# Patient Record
Sex: Female | Born: 1935 | Race: White | Hispanic: No | Smoking: Never smoker
Health system: Southern US, Community
[De-identification: ages and names within clinical notes are randomized; demographics above are authoritative.]

## PROBLEM LIST (undated history)

## (undated) DIAGNOSIS — R296 Repeated falls: Secondary | ICD-10-CM

## (undated) DIAGNOSIS — W19XXXA Unspecified fall, initial encounter: Secondary | ICD-10-CM

## (undated) DIAGNOSIS — R112 Nausea with vomiting, unspecified: Secondary | ICD-10-CM

## (undated) DIAGNOSIS — C189 Malignant neoplasm of colon, unspecified: Secondary | ICD-10-CM

## (undated) DIAGNOSIS — F0280 Dementia in other diseases classified elsewhere without behavioral disturbance: Secondary | ICD-10-CM

## (undated) DIAGNOSIS — E079 Disorder of thyroid, unspecified: Secondary | ICD-10-CM

## (undated) DIAGNOSIS — F419 Anxiety disorder, unspecified: Secondary | ICD-10-CM

## (undated) DIAGNOSIS — Z9889 Other specified postprocedural states: Secondary | ICD-10-CM

## (undated) DIAGNOSIS — G309 Alzheimer's disease, unspecified: Principal | ICD-10-CM

## (undated) DIAGNOSIS — I951 Orthostatic hypotension: Secondary | ICD-10-CM

## (undated) DIAGNOSIS — N390 Urinary tract infection, site not specified: Secondary | ICD-10-CM

## (undated) HISTORY — DX: Disorder of thyroid, unspecified: E07.9

## (undated) HISTORY — PX: ABDOMINAL HYSTERECTOMY: SHX81

## (undated) HISTORY — PX: US ECHOCARDIOGRAPHY: HXRAD669

## (undated) HISTORY — DX: Orthostatic hypotension: I95.1

## (undated) HISTORY — DX: Anxiety disorder, unspecified: F41.9

## (undated) HISTORY — DX: Alzheimer's disease, unspecified: G30.9

## (undated) HISTORY — DX: Malignant neoplasm of colon, unspecified: C18.9

## (undated) HISTORY — DX: Dementia in other diseases classified elsewhere, unspecified severity, without behavioral disturbance, psychotic disturbance, mood disturbance, and anxiety: F02.80

---

## 1998-04-22 ENCOUNTER — Other Ambulatory Visit: Admission: RE | Admit: 1998-04-22 | Discharge: 1998-04-22 | Payer: Self-pay | Admitting: Obstetrics & Gynecology

## 1999-05-20 ENCOUNTER — Other Ambulatory Visit: Admission: RE | Admit: 1999-05-20 | Discharge: 1999-05-20 | Payer: Self-pay | Admitting: Obstetrics and Gynecology

## 1999-12-10 ENCOUNTER — Ambulatory Visit (HOSPITAL_COMMUNITY): Admission: RE | Admit: 1999-12-10 | Discharge: 1999-12-10 | Payer: Self-pay | Admitting: Family Medicine

## 1999-12-10 ENCOUNTER — Encounter: Payer: Self-pay | Admitting: Family Medicine

## 2001-01-05 ENCOUNTER — Other Ambulatory Visit: Admission: RE | Admit: 2001-01-05 | Discharge: 2001-01-05 | Payer: Self-pay | Admitting: Obstetrics and Gynecology

## 2002-03-21 ENCOUNTER — Other Ambulatory Visit: Admission: RE | Admit: 2002-03-21 | Discharge: 2002-03-21 | Payer: Self-pay | Admitting: Obstetrics and Gynecology

## 2003-08-12 ENCOUNTER — Emergency Department (HOSPITAL_COMMUNITY): Admission: EM | Admit: 2003-08-12 | Discharge: 2003-08-12 | Payer: Self-pay | Admitting: Emergency Medicine

## 2003-08-20 ENCOUNTER — Ambulatory Visit (HOSPITAL_COMMUNITY): Admission: RE | Admit: 2003-08-20 | Discharge: 2003-08-20 | Payer: Self-pay | Admitting: Family Medicine

## 2003-12-14 ENCOUNTER — Encounter: Admission: RE | Admit: 2003-12-14 | Discharge: 2003-12-14 | Payer: Self-pay | Admitting: Orthopedic Surgery

## 2007-07-11 ENCOUNTER — Encounter: Payer: Self-pay | Admitting: Family Medicine

## 2007-07-20 ENCOUNTER — Encounter: Payer: Self-pay | Admitting: Family Medicine

## 2008-07-17 ENCOUNTER — Encounter: Payer: Self-pay | Admitting: Family Medicine

## 2008-08-30 HISTORY — PX: PARTIAL COLECTOMY: SHX5273

## 2009-02-27 ENCOUNTER — Encounter: Payer: Self-pay | Admitting: Family Medicine

## 2009-03-08 ENCOUNTER — Encounter: Payer: Self-pay | Admitting: Family Medicine

## 2009-04-07 ENCOUNTER — Encounter: Payer: Self-pay | Admitting: Family Medicine

## 2009-04-13 ENCOUNTER — Inpatient Hospital Stay (HOSPITAL_COMMUNITY): Admission: EM | Admit: 2009-04-13 | Discharge: 2009-04-29 | Payer: Self-pay | Admitting: Emergency Medicine

## 2009-04-15 ENCOUNTER — Encounter (INDEPENDENT_AMBULATORY_CARE_PROVIDER_SITE_OTHER): Payer: Self-pay | Admitting: Gastroenterology

## 2009-04-15 ENCOUNTER — Encounter: Payer: Self-pay | Admitting: Family Medicine

## 2009-04-17 ENCOUNTER — Ambulatory Visit: Payer: Self-pay | Admitting: Oncology

## 2009-04-18 ENCOUNTER — Encounter: Payer: Self-pay | Admitting: Family Medicine

## 2009-04-18 ENCOUNTER — Encounter (INDEPENDENT_AMBULATORY_CARE_PROVIDER_SITE_OTHER): Payer: Self-pay | Admitting: General Surgery

## 2009-04-21 ENCOUNTER — Encounter: Payer: Self-pay | Admitting: Family Medicine

## 2009-04-24 ENCOUNTER — Ambulatory Visit: Payer: Self-pay | Admitting: Oncology

## 2009-07-16 ENCOUNTER — Ambulatory Visit: Payer: Self-pay | Admitting: Oncology

## 2009-07-18 ENCOUNTER — Encounter: Payer: Self-pay | Admitting: Family Medicine

## 2009-07-18 LAB — CBC WITH DIFFERENTIAL/PLATELET
Basophils Absolute: 0 10*3/uL (ref 0.0–0.1)
Eosinophils Absolute: 0.1 10*3/uL (ref 0.0–0.5)
HGB: 11.5 g/dL — ABNORMAL LOW (ref 11.6–15.9)
MONO#: 0.4 10*3/uL (ref 0.1–0.9)
NEUT#: 4.4 10*3/uL (ref 1.5–6.5)
RDW: 15.8 % — ABNORMAL HIGH (ref 11.2–14.5)
lymph#: 2.1 10*3/uL (ref 0.9–3.3)

## 2009-07-18 LAB — COMPREHENSIVE METABOLIC PANEL
Albumin: 4 g/dL (ref 3.5–5.2)
BUN: 18 mg/dL (ref 6–23)
Calcium: 9.3 mg/dL (ref 8.4–10.5)
Chloride: 102 mEq/L (ref 96–112)
Glucose, Bld: 84 mg/dL (ref 70–99)
Potassium: 4.5 mEq/L (ref 3.5–5.3)

## 2009-08-27 ENCOUNTER — Encounter: Admission: RE | Admit: 2009-08-27 | Discharge: 2009-08-27 | Payer: Self-pay | Admitting: General Surgery

## 2009-08-27 ENCOUNTER — Other Ambulatory Visit: Admission: RE | Admit: 2009-08-27 | Discharge: 2009-08-27 | Payer: Self-pay | Admitting: Interventional Radiology

## 2009-09-16 ENCOUNTER — Encounter: Payer: Self-pay | Admitting: Family Medicine

## 2009-10-14 ENCOUNTER — Ambulatory Visit: Payer: Self-pay | Admitting: Oncology

## 2009-10-16 ENCOUNTER — Encounter: Payer: Self-pay | Admitting: Family Medicine

## 2009-10-16 LAB — CBC WITH DIFFERENTIAL/PLATELET
BASO%: 0.3 % (ref 0.0–2.0)
Basophils Absolute: 0 10*3/uL (ref 0.0–0.1)
EOS%: 2.3 % (ref 0.0–7.0)
Eosinophils Absolute: 0.1 10*3/uL (ref 0.0–0.5)
HCT: 36.5 % (ref 34.8–46.6)
HGB: 12 g/dL (ref 11.6–15.9)
LYMPH%: 29.6 % (ref 14.0–49.7)
MCH: 27.9 pg (ref 25.1–34.0)
MCHC: 32.9 g/dL (ref 31.5–36.0)
MCV: 84.7 fL (ref 79.5–101.0)
MONO#: 0.4 10*3/uL (ref 0.1–0.9)
MONO%: 6.6 % (ref 0.0–14.0)
NEUT#: 3.6 10*3/uL (ref 1.5–6.5)
NEUT%: 61.2 % (ref 38.4–76.8)
Platelets: 221 10*3/uL (ref 145–400)
RBC: 4.31 10*6/uL (ref 3.70–5.45)
RDW: 15.8 % — ABNORMAL HIGH (ref 11.2–14.5)
WBC: 5.9 10*3/uL (ref 3.9–10.3)
lymph#: 1.7 10*3/uL (ref 0.9–3.3)

## 2009-10-16 LAB — COMPREHENSIVE METABOLIC PANEL
ALT: 13 U/L (ref 0–35)
AST: 13 U/L (ref 0–37)
Albumin: 4 g/dL (ref 3.5–5.2)
Alkaline Phosphatase: 76 U/L (ref 39–117)
BUN: 18 mg/dL (ref 6–23)
CO2: 30 mEq/L (ref 19–32)
Calcium: 8.9 mg/dL (ref 8.4–10.5)
Chloride: 100 mEq/L (ref 96–112)
Creatinine, Ser: 0.9 mg/dL (ref 0.40–1.20)
Glucose, Bld: 71 mg/dL (ref 70–99)
Potassium: 4.1 mEq/L (ref 3.5–5.3)
Sodium: 138 mEq/L (ref 135–145)
Total Bilirubin: 0.3 mg/dL (ref 0.3–1.2)
Total Protein: 6.8 g/dL (ref 6.0–8.3)

## 2009-10-16 LAB — CEA: CEA: 0.8 ng/mL (ref 0.0–5.0)

## 2010-01-13 ENCOUNTER — Ambulatory Visit: Payer: Self-pay | Admitting: Oncology

## 2010-01-14 ENCOUNTER — Encounter: Payer: Self-pay | Admitting: Family Medicine

## 2010-01-14 LAB — COMPREHENSIVE METABOLIC PANEL
ALT: 8 U/L (ref 0–35)
AST: 13 U/L (ref 0–37)
Albumin: 3.6 g/dL (ref 3.5–5.2)
Alkaline Phosphatase: 80 U/L (ref 39–117)
BUN: 16 mg/dL (ref 6–23)
CO2: 29 mEq/L (ref 19–32)
Calcium: 9.1 mg/dL (ref 8.4–10.5)
Chloride: 101 mEq/L (ref 96–112)
Creatinine, Ser: 0.95 mg/dL (ref 0.40–1.20)
Glucose, Bld: 94 mg/dL (ref 70–99)
Potassium: 4.4 mEq/L (ref 3.5–5.3)
Sodium: 139 mEq/L (ref 135–145)
Total Bilirubin: 0.3 mg/dL (ref 0.3–1.2)
Total Protein: 6.7 g/dL (ref 6.0–8.3)

## 2010-01-14 LAB — CEA: CEA: 0.6 ng/mL (ref 0.0–5.0)

## 2010-01-14 LAB — CBC WITH DIFFERENTIAL/PLATELET
BASO%: 0.3 % (ref 0.0–2.0)
Basophils Absolute: 0 10*3/uL (ref 0.0–0.1)
EOS%: 1.4 % (ref 0.0–7.0)
Eosinophils Absolute: 0.1 10*3/uL (ref 0.0–0.5)
HCT: 38.2 % (ref 34.8–46.6)
HGB: 12.5 g/dL (ref 11.6–15.9)
LYMPH%: 28.9 % (ref 14.0–49.7)
MCH: 28.1 pg (ref 25.1–34.0)
MCHC: 32.6 g/dL (ref 31.5–36.0)
MCV: 86.4 fL (ref 79.5–101.0)
MONO#: 0.4 10*3/uL (ref 0.1–0.9)
MONO%: 5.7 % (ref 0.0–14.0)
NEUT#: 4.8 10*3/uL (ref 1.5–6.5)
NEUT%: 63.7 % (ref 38.4–76.8)
Platelets: 243 10*3/uL (ref 145–400)
RBC: 4.43 10*6/uL (ref 3.70–5.45)
RDW: 15.3 % — ABNORMAL HIGH (ref 11.2–14.5)
WBC: 7.6 10*3/uL (ref 3.9–10.3)
lymph#: 2.2 10*3/uL (ref 0.9–3.3)

## 2010-02-09 ENCOUNTER — Encounter: Payer: Self-pay | Admitting: Family Medicine

## 2010-02-09 LAB — CONVERTED CEMR LAB
BUN: 19 mg/dL
Creatinine, Ser: 0.92 mg/dL
Glucose, Bld: 78 mg/dL

## 2010-04-07 ENCOUNTER — Encounter: Payer: Self-pay | Admitting: Family Medicine

## 2010-04-21 ENCOUNTER — Encounter: Payer: Self-pay | Admitting: Family Medicine

## 2010-04-28 ENCOUNTER — Ambulatory Visit (HOSPITAL_COMMUNITY): Admission: RE | Admit: 2010-04-28 | Discharge: 2010-04-28 | Payer: Self-pay | Admitting: Oncology

## 2010-04-28 ENCOUNTER — Ambulatory Visit: Payer: Self-pay | Admitting: Oncology

## 2010-04-28 LAB — COMPREHENSIVE METABOLIC PANEL
ALT: 10 U/L (ref 0–35)
AST: 16 U/L (ref 0–37)
Albumin: 3.7 g/dL (ref 3.5–5.2)
Alkaline Phosphatase: 72 U/L (ref 39–117)
BUN: 12 mg/dL (ref 6–23)
CO2: 30 mEq/L (ref 19–32)
Calcium: 9 mg/dL (ref 8.4–10.5)
Chloride: 101 mEq/L (ref 96–112)
Creatinine, Ser: 0.97 mg/dL (ref 0.40–1.20)
Glucose, Bld: 100 mg/dL — ABNORMAL HIGH (ref 70–99)
Potassium: 4.1 mEq/L (ref 3.5–5.3)
Sodium: 139 mEq/L (ref 135–145)
Total Bilirubin: 0.7 mg/dL (ref 0.3–1.2)
Total Protein: 7.3 g/dL (ref 6.0–8.3)

## 2010-04-28 LAB — CBC WITH DIFFERENTIAL/PLATELET
BASO%: 0.3 % (ref 0.0–2.0)
Basophils Absolute: 0 10*3/uL (ref 0.0–0.1)
EOS%: 2.3 % (ref 0.0–7.0)
Eosinophils Absolute: 0.2 10*3/uL (ref 0.0–0.5)
HCT: 38.2 % (ref 34.8–46.6)
HGB: 12.5 g/dL (ref 11.6–15.9)
LYMPH%: 31.3 % (ref 14.0–49.7)
MCH: 28.6 pg (ref 25.1–34.0)
MCHC: 32.8 g/dL (ref 31.5–36.0)
MCV: 87.3 fL (ref 79.5–101.0)
MONO#: 0.5 10*3/uL (ref 0.1–0.9)
MONO%: 6.2 % (ref 0.0–14.0)
NEUT#: 4.5 10*3/uL (ref 1.5–6.5)
NEUT%: 59.9 % (ref 38.4–76.8)
Platelets: 227 10*3/uL (ref 145–400)
RBC: 4.37 10*6/uL (ref 3.70–5.45)
RDW: 14.8 % — ABNORMAL HIGH (ref 11.2–14.5)
WBC: 7.5 10*3/uL (ref 3.9–10.3)
lymph#: 2.3 10*3/uL (ref 0.9–3.3)

## 2010-04-28 LAB — CEA: CEA: <0.5 ng/mL (ref 0.0–5.0)

## 2010-04-30 ENCOUNTER — Encounter: Payer: Self-pay | Admitting: Family Medicine

## 2010-05-18 ENCOUNTER — Encounter: Payer: Self-pay | Admitting: Family Medicine

## 2010-06-16 ENCOUNTER — Encounter: Payer: Self-pay | Admitting: Family Medicine

## 2010-07-15 ENCOUNTER — Ambulatory Visit: Payer: Self-pay | Admitting: Family Medicine

## 2010-07-15 DIAGNOSIS — I951 Orthostatic hypotension: Secondary | ICD-10-CM

## 2010-07-15 DIAGNOSIS — F411 Generalized anxiety disorder: Secondary | ICD-10-CM | POA: Insufficient documentation

## 2010-07-15 DIAGNOSIS — C189 Malignant neoplasm of colon, unspecified: Secondary | ICD-10-CM | POA: Insufficient documentation

## 2010-07-29 ENCOUNTER — Encounter: Payer: Self-pay | Admitting: Family Medicine

## 2010-07-29 LAB — HM COLONOSCOPY

## 2010-08-27 ENCOUNTER — Ambulatory Visit: Payer: Self-pay | Admitting: Oncology

## 2010-09-29 NOTE — Letter (Signed)
Summary: [facility]  [facility]   Imported By: Sherian Rein 08/04/2010 09:41:24  _____________________________________________________________________  External Attachment:    Type:   Image     Comment:   External Document

## 2010-09-29 NOTE — Letter (Signed)
Summary: [facility]  [facility]   Imported By: Maryln Gottron 07/27/2010 14:35:52  _____________________________________________________________________  External Attachment:    Type:   Image     Comment:   External Document

## 2010-09-29 NOTE — Assessment & Plan Note (Signed)
Summary: NEW PT TO EST/CLE   Vital Signs:  Patient profile:   77 year old female Height:      64 inches Weight:      171.25 pounds BMI:     29.50 Temp:     97.4 degrees F oral Pulse rate:   68 / minute Pulse rhythm:   regular BP sitting:   120 / 80  (right arm) Cuff size:   regular  Vitals Entered By: Linde Gillis CMA Duncan Dull) (July 20, 2010 1:48 PM) CC: new patient, establish care   History of Present Illness: Here to est care.   Orthostasis.  Off midodrine and feeling better in the meantime.  H/o orthostasis but no symptoms currently.  No CP, minimal bilateral lower extremity edema.   Anxiety, taking diazepam 2 times or less per week.  D/w patient about falls, reflexes, driving today.  No h/o adverse effect.  Longstanding episodic symptoms.  Good relief with meds.   colon CA, s/p resection last year w/o known recurrence.  No BRBPR and feeling well.  requesting records.  Also with h/o thyroid nodule with benign bx.    Current Medications (verified): 1)  Aspirin 81 Mg Tabs (Aspirin) .... Take One Tablet By Mouth Daily 2)  Diazepam 2 Mg Tabs (Diazepam) .... Take As Needed  Allergies (verified): 1)  ! Erythromycin 2)  ! Sulfa  Past History:  Family History: Last updated: 07/20/2010 F dead at 31 'old age', PNA M dead, 'heart trouble', irregular heartbeat at 39   Social History: Last updated: 07/20/10 Widow x2, lives alone.  Drives, independent.  1 adopted daughter, leaves near patient.  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 Gaylyn Rong with oncology) H/o anxiety, would use as needed diazepam with relief H/o thyroid nodule with benign bx 2010 h/o orthostasis, didn't tolerate midodrine, prev eval by Dr. Anne Fu  Past Surgical History: Hysterectomy Partial colectomy 2010 for colon CA  Family History: F dead at 37 'old  age', PNA M dead, 'heart trouble', irregular heartbeat at 87   Social History: Widow x2, lives alone.  Drives, independent.  1 adopted daughter, leaves near patient.  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  Otherwise negative.    Physical Exam  General:  GEN: nad, alert and oriented HEENT: mucous membranes moist NECK: supple w/o LA CV: rrr.  no murmur PULM: ctab, no inc wob ABD: soft, +bs EXT: trace ble edema SKIN: no acute rash    Impression & Recommendations:  Problem # 1:  POSTURAL HYPOTENSION (ICD-458.0) Resolved, no symptoms currently.  Follow symptomatically.  Requesting records from cards.   Problem # 2:  ANXIETY STATE, UNSPECIFIED (ICD-300.00) Cont current meds.  use d/w patient. see HPI.  Her updated medication list for this problem includes:    Diazepam 2 Mg Tabs (Diazepam) .Marland Kitchen... 1 po daily as needed, sedation caution  Problem # 3:  ADENOCARCINOMA, COLON (ICD-153.9) requesting records.   Flu shot encouraged, declined by patient.   Complete Medication List: 1)  Aspirin 81 Mg Tabs (Aspirin) .... Take one tablet by mouth daily 2)  Diazepam 2 Mg Tabs (Diazepam) .Marland Kitchen.. 1 po daily as needed, sedation caution  Patient Instructions: 1)  I'll get your records.  Let me know if you have questions or concerns.  I would get a flu shot this fall.  Take care.  Prescriptions: DIAZEPAM 2 MG TABS (DIAZEPAM) 1 po daily as needed, sedation caution  #30 x 5   Entered and Authorized by:   Crawford Givens MD   Signed by:   Crawford Givens MD on 07/15/2010   Method used:   Telephoned to ...       [facility]* (retail)       [address]       [place], [place]  [id number]       Ph: [phone]       Fax: [phone]   RxID:   [id number]   Rx called to pharmacy.  Linde Gillis CMA Duncan Dull)  July 15, 2010 2:53 PM  Orders Added: 1)  New  Patient Level III [99203]    Current Allergies (reviewed today): ! ERYTHROMYCIN ! SULFA

## 2010-09-29 NOTE — Letter (Signed)
Summary: Colonoscopy Benign Adenomatous Polyp repeat 2 yrs/[facility]  [facility]   Imported By: Sherian Rein 08/04/2010 09:40:40  _____________________________________________________________________  External Attachment:    Type:   Image     Comment:   External Document

## 2010-09-29 NOTE — Letter (Signed)
Summary: [facility]  [facility]   Imported By: Maryln Gottron 07/27/2010 14:40:47  _____________________________________________________________________  External Attachment:    Type:   Image     Comment:   External Document

## 2010-09-29 NOTE — Letter (Signed)
[facility]   Imported By: Sherian Rein 08/04/2010 09:52:54  _____________________________________________________________________  External Attachment:    Type:   Image     Comment:   External Document

## 2010-09-29 NOTE — Letter (Signed)
Summary: [facility]  [facility]   Imported By: Sherian Rein 08/04/2010 09:39:46  _____________________________________________________________________  External Attachment:    Type:   Image     Comment:   External Document

## 2010-09-29 NOTE — Procedures (Signed)
Summary: Colonoscopy/[facility]  Colonoscopy/[facility]   Imported By: Sherian Rein 08/04/2010 09:31:17  _____________________________________________________________________  External Attachment:    Type:   Image     Comment:   External Document

## 2010-09-29 NOTE — Letter (Signed)
Summary: [facility]  [facility]   Imported By: Sherian Rein 08/04/2010 09:28:31  _____________________________________________________________________  External Attachment:    Type:   Image     Comment:   External Document

## 2010-09-29 NOTE — Letter (Signed)
Summary: [facility]   Imported By: Sherian Rein 08/04/2010 09:37:18  _____________________________________________________________________  External Attachment:    Type:   Image     Comment:   External Document

## 2010-09-29 NOTE — Letter (Signed)
Summary: Normal EF,mild diastolic dysfunction,mild tricuspid regurg/[facility]  Normal EF,mild diastolic dysfunction,mild tricuspid regurg/[facility]   Imported By: Sherian Rein 08/04/2010 09:59:43  _____________________________________________________________________  External Attachment:    Type:   Image     Comment:   External Document

## 2010-09-29 NOTE — Letter (Signed)
Summary: [facility]  [facility]   Imported By: Sherian Rein 08/04/2010 09:48:55  _____________________________________________________________________  External Attachment:    Type:   Image     Comment:   External Document

## 2010-09-29 NOTE — Letter (Signed)
Summary: [facility]   Imported By: Sherian Rein 08/04/2010 09:38:07  _____________________________________________________________________  External Attachment:    Type:   Image     Comment:   External Document

## 2010-09-29 NOTE — Letter (Signed)
Summary: [facility]  [facility]   Imported By: Sherian Rein 08/04/2010 09:56:29  _____________________________________________________________________  External Attachment:    Type:   Image     Comment:   External Document

## 2010-09-29 NOTE — Miscellaneous (Signed)
  Clinical Lists Changes  Observations: Added new observation of PAST MED HX: h/o colon CA 2010- colectomy 2010  (Dr. Gaylyn Rong with oncology) H/o anxiety, would use as needed diazepam with relief H/o thyroid nodule with benign bx 2010 h/o orthostasis, didn't tolerate midodrine, prev eval by Dr. Anne Fu 02/27/2009  [facility] Echocardiogram - Normal EF, mild diastolic dysfunction, mild tricuspid regurgitation (07/29/2010 14:16) Added new observation of COLONNXTDUE: 04/2012 (07/29/2010 14:16) Added new observation of COLONOSCOPY: Adenomatous Polyp (04/21/2010 14:17) Added new observation of CREATININE: 0.92 mg/dL (16/05/9603 54:09) Added new observation of BUN: 19 mg/dL (81/19/1478 29:56) Added new observation of BG RANDOM: 78 mg/dL (21/30/8657 84:69)      Preventive Care Screening  Colonoscopy:    Date:  04/21/2010    Next Due:  04/2012    Results:  Adenomatous Polyp    Past History:  Past Medical History: h/o colon CA 2010- colectomy 2010  (Dr. Gaylyn Rong with oncology) H/o anxiety, would use as needed diazepam with relief H/o thyroid nodule with benign bx 2010 h/o orthostasis, didn't tolerate midodrine, prev eval by Dr. Anne Fu 02/27/2009  [facility] Echocardiogram - Normal EF, mild diastolic dysfunction, mild tricuspid regurgitation

## 2010-09-29 NOTE — Letter (Signed)
Summary: [facility]  [facility]   Imported By: Maryln Gottron 07/27/2010 14:39:23  _____________________________________________________________________  External Attachment:    Type:   Image     Comment:   External Document

## 2010-09-29 NOTE — Letter (Signed)
Summary: [facility]   Imported By: Sherian Rein 08/04/2010 09:39:04  _____________________________________________________________________  External Attachment:    Type:   Image     Comment:   External Document

## 2010-12-05 LAB — BASIC METABOLIC PANEL
BUN: 2 mg/dL — ABNORMAL LOW (ref 6–23)
BUN: 5 mg/dL — ABNORMAL LOW (ref 6–23)
BUN: 8 mg/dL (ref 6–23)
CO2: 24 mEq/L (ref 19–32)
CO2: 29 mEq/L (ref 19–32)
CO2: 29 mEq/L (ref 19–32)
CO2: 29 mEq/L (ref 19–32)
CO2: 31 mEq/L (ref 19–32)
CO2: 33 mEq/L — ABNORMAL HIGH (ref 19–32)
Calcium: 7.7 mg/dL — ABNORMAL LOW (ref 8.4–10.5)
Calcium: 8.3 mg/dL — ABNORMAL LOW (ref 8.4–10.5)
Calcium: 8.5 mg/dL (ref 8.4–10.5)
Chloride: 101 mEq/L (ref 96–112)
Chloride: 102 mEq/L (ref 96–112)
Chloride: 107 mEq/L (ref 96–112)
Chloride: 107 mEq/L (ref 96–112)
Creatinine, Ser: 0.64 mg/dL (ref 0.4–1.2)
Creatinine, Ser: 0.72 mg/dL (ref 0.4–1.2)
Creatinine, Ser: 0.72 mg/dL (ref 0.4–1.2)
Creatinine, Ser: 0.93 mg/dL (ref 0.4–1.2)
GFR calc Af Amer: >60 mL/min (ref >60)
GFR calc Af Amer: >60 mL/min (ref >60)
GFR calc Af Amer: >60 mL/min (ref >60)
GFR calc Af Amer: >60 mL/min (ref >60)
GFR calc non Af Amer: 58 mL/min — ABNORMAL LOW (ref >60)
GFR calc non Af Amer: >60 mL/min (ref >60)
GFR calc non Af Amer: >60 mL/min (ref >60)
Glucose, Bld: 104 mg/dL — ABNORMAL HIGH (ref 70–99)
Glucose, Bld: 105 mg/dL — ABNORMAL HIGH (ref 70–99)
Glucose, Bld: 138 mg/dL — ABNORMAL HIGH (ref 70–99)
Glucose, Bld: 173 mg/dL — ABNORMAL HIGH (ref 70–99)
Potassium: 2.9 mEq/L — ABNORMAL LOW (ref 3.5–5.1)
Potassium: 3.4 mEq/L — ABNORMAL LOW (ref 3.5–5.1)
Potassium: 3.5 mEq/L (ref 3.5–5.1)
Potassium: 3.6 mEq/L (ref 3.5–5.1)
Sodium: 138 mEq/L (ref 135–145)
Sodium: 138 mEq/L (ref 135–145)
Sodium: 138 mEq/L (ref 135–145)
Sodium: 139 mEq/L (ref 135–145)

## 2010-12-05 LAB — HEMOGLOBIN AND HEMATOCRIT, BLOOD
HCT: 28.5 % — ABNORMAL LOW (ref 36.0–46.0)
HCT: 28.5 % — ABNORMAL LOW (ref 36.0–46.0)
HCT: 31.4 % — ABNORMAL LOW (ref 36.0–46.0)
Hemoglobin: 10.1 g/dL — ABNORMAL LOW (ref 12.0–15.0)
Hemoglobin: 10.3 g/dL — ABNORMAL LOW (ref 12.0–15.0)
Hemoglobin: 10.9 g/dL — ABNORMAL LOW (ref 12.0–15.0)
Hemoglobin: 9.1 g/dL — ABNORMAL LOW (ref 12.0–15.0)
Hemoglobin: 9.3 g/dL — ABNORMAL LOW (ref 12.0–15.0)

## 2010-12-05 LAB — CBC
HCT: 24.1 % — ABNORMAL LOW (ref 36.0–46.0)
HCT: 28.6 % — ABNORMAL LOW (ref 36.0–46.0)
HCT: 28.9 % — ABNORMAL LOW (ref 36.0–46.0)
HCT: 30.4 % — ABNORMAL LOW (ref 36.0–46.0)
HCT: 31.5 % — ABNORMAL LOW (ref 36.0–46.0)
Hemoglobin: 6.7 g/dL — CL (ref 12.0–15.0)
Hemoglobin: 8.1 g/dL — ABNORMAL LOW (ref 12.0–15.0)
Hemoglobin: 9.2 g/dL — ABNORMAL LOW (ref 12.0–15.0)
Hemoglobin: 9.6 g/dL — ABNORMAL LOW (ref 12.0–15.0)
Hemoglobin: 9.7 g/dL — ABNORMAL LOW (ref 12.0–15.0)
MCHC: 33.5 g/dL (ref 30.0–36.0)
MCHC: 33.5 g/dL (ref 30.0–36.0)
MCHC: 33.5 g/dL (ref 30.0–36.0)
MCHC: 33.6 g/dL (ref 30.0–36.0)
MCHC: 33.7 g/dL (ref 30.0–36.0)
MCHC: 33.8 g/dL (ref 30.0–36.0)
MCHC: 33.8 g/dL (ref 30.0–36.0)
MCHC: 34 g/dL (ref 30.0–36.0)
MCV: 87.5 fL (ref 78.0–100.0)
MCV: 87.7 fL (ref 78.0–100.0)
MCV: 87.8 fL (ref 78.0–100.0)
MCV: 88.4 fL (ref 78.0–100.0)
MCV: 88.5 fL (ref 78.0–100.0)
Platelets: 190 10*3/uL (ref 150–400)
Platelets: 221 10*3/uL (ref 150–400)
RBC: 3.1 MIL/uL — ABNORMAL LOW (ref 3.87–5.11)
RBC: 3.23 MIL/uL — ABNORMAL LOW (ref 3.87–5.11)
RBC: 3.28 MIL/uL — ABNORMAL LOW (ref 3.87–5.11)
RBC: 3.3 MIL/uL — ABNORMAL LOW (ref 3.87–5.11)
RDW: 16 % — ABNORMAL HIGH (ref 11.5–15.5)
RDW: 16.1 % — ABNORMAL HIGH (ref 11.5–15.5)
RDW: 16.1 % — ABNORMAL HIGH (ref 11.5–15.5)
WBC: 14.4 10*3/uL — ABNORMAL HIGH (ref 4.0–10.5)
WBC: 14.4 10*3/uL — ABNORMAL HIGH (ref 4.0–10.5)
WBC: 5.9 10*3/uL (ref 4.0–10.5)

## 2010-12-05 LAB — DIFFERENTIAL
Basophils Relative: 0 % (ref 0–1)
Eosinophils Absolute: 0.2 10*3/uL (ref 0.0–0.7)
Eosinophils Relative: 4 % (ref 0–5)
Lymphs Abs: 2.1 10*3/uL (ref 0.7–4.0)

## 2010-12-05 LAB — VITAMIN B12: Vitamin B-12: 402 pg/mL (ref 211–911)

## 2010-12-05 LAB — CROSSMATCH
ABO/RH(D): O POS
Antibody Screen: NEGATIVE

## 2010-12-05 LAB — TYPE AND SCREEN: Antibody Screen: NEGATIVE

## 2010-12-05 LAB — IRON: Iron: 26 ug/dL — ABNORMAL LOW (ref 42–135)

## 2010-12-05 LAB — FOLATE RBC: RBC Folate: 802 ng/mL — ABNORMAL HIGH (ref 180–600)

## 2010-12-05 LAB — GLUCOSE, CAPILLARY: Glucose-Capillary: 104 mg/dL — ABNORMAL HIGH (ref 70–99)

## 2010-12-06 LAB — COMPREHENSIVE METABOLIC PANEL
AST: 19 U/L (ref 0–37)
BUN: 21 mg/dL (ref 6–23)
CO2: 29 mEq/L (ref 19–32)
Calcium: 8.4 mg/dL (ref 8.4–10.5)
Chloride: 104 mEq/L (ref 96–112)
Creatinine, Ser: 0.9 mg/dL (ref 0.4–1.2)
GFR calc Af Amer: >60 mL/min (ref >60)
GFR calc non Af Amer: >60 mL/min (ref >60)
Glucose, Bld: 113 mg/dL — ABNORMAL HIGH (ref 70–99)
Total Bilirubin: 0.5 mg/dL (ref 0.3–1.2)

## 2010-12-06 LAB — CBC
Hemoglobin: 10.4 g/dL — ABNORMAL LOW (ref 12.0–15.0)
MCHC: 31.9 g/dL (ref 30.0–36.0)
MCV: 86.3 fL (ref 78.0–100.0)
RBC: 3.8 MIL/uL — ABNORMAL LOW (ref 3.87–5.11)
RDW: 15 % (ref 11.5–15.5)

## 2010-12-06 LAB — DIFFERENTIAL
Basophils Absolute: 0 10*3/uL (ref 0.0–0.1)
Basophils Relative: 0 % (ref 0–1)
Eosinophils Absolute: 0 10*3/uL (ref 0.0–0.7)
Eosinophils Relative: 0 % (ref 0–5)
Monocytes Absolute: 0.3 10*3/uL (ref 0.1–1.0)
Monocytes Relative: 4 % (ref 3–12)
Neutro Abs: 5 10*3/uL (ref 1.7–7.7)

## 2010-12-06 LAB — TYPE AND SCREEN

## 2010-12-06 LAB — ABO/RH: ABO/RH(D): O POS

## 2010-12-06 LAB — HEMOGLOBIN AND HEMATOCRIT, BLOOD
HCT: 27 % — ABNORMAL LOW (ref 36.0–46.0)
Hemoglobin: 8.8 g/dL — ABNORMAL LOW (ref 12.0–15.0)

## 2010-12-06 LAB — APTT: aPTT: 32 seconds (ref 24–37)

## 2011-01-12 NOTE — Op Note (Signed)
[name], [name]             ACCOUNT NO.:  [id number]   MEDICAL RECORD NO.:  [id number]          PATIENT TYPE:  INP   LOCATION:  1425                         FACILITY:  [facility]   PHYSICIAN:  Lennie Muckle, MD      DATE OF BIRTH:  1927-09-30   DATE OF PROCEDURE:  04/18/2009  DATE OF DISCHARGE:                               OPERATIVE REPORT   PREOPERATIVE DIAGNOSIS:  Sigmoid cancer.   POSTOPERATIVE DIAGNOSIS:  Sigmoid cancer.   PROCEDURE:  1. Low anterior resection.  2. Liver biopsy.  3. Ultrasound liver intraoperatively.   ASSISTANT:  Gaynelle Adu, MD   FINDINGS:  Tumor adherent to the peritoneum approximately 15 cm to  peritoneal reflection.  Adhesions to the left side wall and anterior  side wall.   ESTIMATED BLOOD LOSS:  400 mL   No immediate complications.   No drains were placed.   INDICATIONS FOR PROCEDURE:  [name] is a  81-year female who had  come in with rectal bleeding.  She had a colonoscopy which did reveal a  mass approximately 20 cm from the dentate line.  Biopsy was consistent  with an adenocarcinoma.  She received a preop CT scan with  small  nodules noted in the liver and the lung.  These were indeterminate.  She  had had a preoperative CAT scan which was pending at the time.  Due to  the bleeding from the tumor, it was felt that she needed to have  resection of the tumor.  I talked to her preoperatively about the  surgery itself.  Risks of the surgery including bleeding, infection,  anastomotic leak, reoperation and transfusion were explained to the  patient and her mother.  All questions were answered.   DETAILS OF THE PROCEDURE:  [name] was identified in the  preoperative holding area.  She was given 2 grams of cefoxitin and taken  to the operating room.  She had 1gram of cefoxitin. Once on the  operating table, placed in supine position.  After administration of  general endotracheal anesthesia,  Foley catheter was placed.  Spontaneous compression devices were placed to bilateral lower  extremities.  She was placed in lithotomy position.  Her abdomen was  fully prepped and draped in usual sterile fashion.  A time-out proceeded  and correct  patient and procedure were confirmed.  I placed an incision  at the midline using the old incisional scar.  I divided subcutaneous  tissues with electrocautery.  I gained entry into abdominal cavity just  superiorly to the old incision.  There were adhesions to the midline.  These were taken down with harmonic scalpel.  I continued inferiorly  down into the pelvis.  Multiple adhesions of omentum to the abdominal  wall were taken down with the harmonic scalpel.  There was also a loop  in the sigmoid colon which was also adhered.  This was taken down  carefully.  I was able to gain entry into the pelvis.  There were  multiple adhesions of the colon to the anterior abdominal wall.  The  omentum was adhered down into  the pelvis.  I continued dissecting this  with electrocautery with the Harmonic scalpel.  The omentum seemed  adhered to the bladder.  I did take this down without injury to the  bladder.  I was able to palpate the Foley catheter within the bladder.  I continued dissecting omentum away from the peritoneum.  I was then  able to identify the tattooing by  Dr. Evette Cristal.  It actually was somewhat  large in size.  It does seem somewhat adherent to the peritoneum.  I  palpated the cecum and the ascending colon.  I palpated no other  lesions.  I then palpated the liver.  There were small hard densities  palpable on the right lobe of the liver.  This could have been  calcifications.  I inspected the liver.  I found a small lesion  inferiorly just above the gallbladder.  I  used ultrasound to see if I  could see  any worrisome findings.  I found none of significance.  This  was somewhat of a limited view due to the prep and ultrasound.  I did  visualize the lesion just  superior to the  gallbladder.  Using a number  10 blade, I biopsied  this area from the liver.  This area was packed  with Surgicel and using electrocautery to maintain hemostasis.  The  specimen was passed off the operative field and  frozen section revealed  a few inflammatory cells but  inconclusive for malignancy.  We then  tacked the liver and turned the attention to the sigmoid colon.  I began  mobilizing the sigmoid along the line of Toldt.  Superiorly this was  fairly  easy.  Inferiorly there were multiple adhesions to the lateral  wall presumably from her previous episode of endometriosis.  I was able  to stay along the colon and avoid injury to the ureter.  I continued  dissecting down into the pelvis.  There were multiple adhesions within  this vicinity making the dissection somewhat difficult.  I stayed along  the colon and using Harmonic scalpel to dissect the colon away from the  peritoneum.  I was able to get approximately 20 cm distance from the  tumor down in the pelvis.  I then mobilized superiorly the colon at the  hepatic flexure. I took down the flexure.  There were two small  tears  to the serosa of the colon.  The colon wall was very thin.  I continued  mobilizing the  splenic flexure onto the transverse colon.  The colon  was very floppy and made this easy to extend down to the pelvis.  I then  brought a linear curved stapler and stapled the distal segment.  Using a  harmonic scalpel, I transected across the vessels.  We sutured with a  2-  0 silk the bigger branches of the IMA.  I then continued dissecting the  mesentery. We chose an area proximal to the serosal tears.  Using the  GIA stapler,  we removed the specimen intact.  The distal segment was  marked with a suture.  I then was able to bring the colon down to the  pelvis.  This was  placed easily without tension.  I then went below the  patient and performed rigid anoscopy.  I then performed a stapled   anastomosis with the EEA stapler. We used a running PDS suture on the  anvil.  After stapling the staple line was intact.  The  anastomosis was  without tension and anoscopy found no bubbles  at the anastomosis site.  There was some mild oozing from the staple line which was oversewn with  a 3-0 Vicryl silk suture.  I irrigated the abdomen.  There was some mild  oozing on the left side wall from the adhesions.  I placed a 2-0 silk  suture around the gonadal vessel.  I found no further evidence of  bleeding.  I placed the omentum over the staple line.  We then inspected  the liver from the previous biopsy and found no evidence of bleeding.  Final inspection of abdomen revealed no abnormalities and  no bleeding.  We closed the fascia using a running PDS suture.  Skin was  stapled closed.  All counts were correct at the end of the case.  The  patient tolerated the procedure well and will be monitored  postoperatively for any need for transfusion.  Will await on the final  pathology of whether or not she needs to have chemotherapy.      Lennie Muckle, MD  Electronically Signed     ALA/MEDQ  D:  04/18/2009  T:  04/19/2009  Job:  [id number]   cc:   Jethro Bolus, MD   Graylin Shiver, M.D.  Fax: [phone]   Dario Guardian, M.D.  Fax: [phone]   Corinna L. Lendell Caprice, MD
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
## 2011-01-12 NOTE — Discharge Summary (Signed)
[name], [name]             ACCOUNT NO.:  [id number]   MEDICAL RECORD NO.:  [id number]          PATIENT TYPE:  INP   LOCATION:  1425                         FACILITY:  [facility]   PHYSICIAN:  Hollice Espy, M.D.DATE OF BIRTH:  Dec 01, 1927   DATE OF ADMISSION:  04/13/2009  DATE OF DISCHARGE:  04/29/2009                               DISCHARGE SUMMARY   PRIMARY CARE PHYSICIAN:  Dr. Merri Brunette.   CONSULTANT:  Dr. Wandalee Ferdinand of [facility], Dr. Armstead Peaks  [facility], Dr. Susy Frizzle [facility] and Dr. Jethro Bolus  oncology.   DISCHARGE DIAGNOSES:  1. Stage I colon cancer.  2. Status post partial colonic resection.  3. Chronic anemia from gastrointestinal blood loss secondary to #1.  4. Status post transfusion to treat anemia.  5. History of orthostatic hypotension.  6. Abdominal wound status post debridement complications secondary to      #2.  7. Colonoscopy done April 15, 2009.   HOSPITAL COURSE:  The patient is a 81-year white female with past  medical history of orthostatic hypotension who presented with complaints  of multiple episodes of bright red blood per rectum.  She had been on a  daily aspirin.  She has never had a previous colonoscopy.  When she  presented to the emergency room her systolic blood pressure is in the  110s up to 130.  She was orthostatic although she has a previous history  of orthostasis.  Her aspirin was held.  Her hemoglobin on admission was  10.4.  GI was consulted to see the patient.  The patient underwent a  colonoscopy done April 15, 2009.  The patient's colonoscopy noted a  sigmoid colon at 20 cm, a biopsy was done and some rare diverticula were  noted as well.  No signs of complete obstruction.  The patient's  pathology came back positive for adenocarcinoma of the sigmoid colon.  Dr. Freida Busman from [facility] was consulted and plans for a  sigmoid resection were put into place.  In the meantime with Dr.  Gaylyn Rong from  oncology was consulted.  A PET scan was ordered as well.  The patient's  PET scan noted some enhanced activity noted in the region of the sigmoid  colon.  No other signs throughout the body showed signs of any  metastatic disease.  However, there was some abnormal uptake in the  region of a nodule in the left thyroid lobe.  Thyroid cancer could not  be excluded.  This was discussed with the hospitalist and Dr. Freida Busman.  Dr. Freida Busman took the patient for surgery on April 18, 2009 doing a  sigmoid colonic resection.  She tolerated this well.  Pathology was  sent.  In the meantime she was started on clear liquids.  Her white  count remained stable.  Her hemoglobin was initially 9.2.  By hospital  day #3 her white blood count had improved from 14.4 down to 11.5 and she  was not felt to be showing any signs of infection.  This was more felt  to be secondary to stress margination from surgery.  Her hemoglobin did  drop down to as low as 6.7 on April 20, 2009 and the patient received a  transfusion.  This was felt to be secondary to anemia secondary to  surgery.  Over the next several days the patient continued to improve.  The patient's pathology came back consistent  with a stage I colon cancer which does not require any chemo or  radiation therapy other than surgical removal.  Dr. Gaylyn Rong recommended  follow-up in 3 months in his office.  In regard to the patient's thyroid  nodule, the plan will be postoperative Dr. Freida Busman will set up an  outpatient thyroid biopsy.      Hollice Espy, M.D.  Electronically Signed     SKK/MEDQ  D:  04/29/2009  T:  04/29/2009  Job:  [id number]
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
## 2011-01-12 NOTE — Discharge Summary (Signed)
[name], [name]             ACCOUNT NO.:  [id number]   MEDICAL RECORD NO.:  [id number]          PATIENT TYPE:  INP   LOCATION:  1425                         FACILITY:  [facility]   PHYSICIAN:  Hollice Espy, M.D.DATE OF BIRTH:  [birth year]   DATE OF ADMISSION:  04/13/2009  DATE OF DISCHARGE:                               DISCHARGE SUMMARY   ADDENDUM:  The patient postop, her diet was advanced.  She seemed to do  well.  She received blood for postop anemia.  Dr. Freida Busman planned to do  her thyroid biopsy as an outpatient.  The patient slowly recovered.  She  still had some problems with orthostatic hypotension.  Her midodrine  which she takes for that was resumed.  She seemed to do well.  The  patient, however, started having problems with nausea.  Then Dr. Marcille Blanco from [facility] followed up on April 25, 2009.  He  noted that her abdomen showed some signs of minimal distention and  around her incision, there appeared to be some erythema at the bottom of  the wound.  Dr. Corliss Skains started Keflex.  He felt that if her incision did  not improve, he would re-open the wound to examine.  On the following  day,  the patient actually showed to have increased erythema.  The wound  was opened at the base in two areas and a large amount of foul-smelling  pus was expressed.  The fascia was intact.  Dr. Corliss Skains continued her on  Keflex and began wet-to-dry dressings.  A CBC noted that the patient's  white count had increased from zero up to 14.4.  Following wound  dressing changes and continued on her Keflex, her white count improved  down to 10 on April 28, 2009.  Dr. Freida Busman followed up on April 28, 2009  and felt the patient was much improved.  She recommended b.i.d. dressing  changes wet-to-dry done through home health.  The patient's nausea had  improved once her wound was cleaned and she will continue on Keflex for  another 7 days.   DISCHARGE MEDICATIONS:  1. She will  continue on her midodrine 2.5 p.o. three times a day.  2. She will hold her aspirin 325 daily until she has follow up with      Dr. Freida Busman in 1 week's time.  3. She will be discharged on Keflex 250 mg p.o. t.i.d. x1 week.  4. She will be given prescriptions for Phenergan 12.5 p.o. q.8 h.      p.r.n., total number 20.  5. Vicodin 5/500 one p.o. q.6 h. p.r.n. for pain, total number 20.   FOLLOW UP:  1. She will see Dr. Bertram Savin in 1 week for wound followup.  Dr.      Freida Busman at that time will also set up her outpatient thyroid biopsy.  2. She will follow up with her PCP, Dr. Merri Brunette in 3-4 weeks.  3. She will follow with Dr. Gaylyn Rong at the [facility] in 3      months.   DISCHARGE DIET:  Regular diet as tolerated.  ACTIVITY:  As per home health PT/OT.  They will continue to do wound  dressings b.i.d.   DISPOSITION:  The patient is being discharged to home under the care of  her family.   Please note the previous discharge summary which dictated the first half  of this discharge was cut off and this is the second half of this  discharge summary.      Hollice Espy, M.D.  Electronically Signed     SKK/MEDQ  D:  04/29/2009  T:  04/29/2009  Job:  [id number]   cc:   Jethro Bolus, MD   Lennie Muckle, MD  [address]   [address]  [place] [address]   Graylin Shiver, M.D.  Fax: [phone]   Wilmon Arms. Corliss Skains, M.D.  [address] [place] [address]  [place]   Dario Guardian, M.D.  Fax: [phone]

## 2011-01-12 NOTE — H&P (Signed)
NAMEAZALYN, [name]             ACCOUNT NO.:  [id number]   MEDICAL RECORD NO.:  [id number]          PATIENT TYPE:  EMS   LOCATION:  ED                           [facility]ACILITY[facility]  [facility]   PHYSICIAN:  Corinna L. Lendell Caprice, MDDATE OF BIRTH:  [birth year]   DATE OF ADMISSION:  04/13/2009  DATE OF DISCHARGE:                              HISTORY & PHYSICAL   CHIEF COMPLAINT:  Rectal bleeding.   HISTORY OF PRESENT ILLNESS:  Ms. Hornbaker is a pleasant 77 year old  white female who presents with 4 to 5 episodes of bright red blood per  rectum.  She is on an aspirin a day.  She has no pain.  She has had no  weight loss.  She has never had colonoscopy.  She has chronic dizziness  from orthostatic hypotension and was recently started on midodrine.  She  felt dizzier than usual today.  She had on rectal exam per ED staff, no  hemorrhoids, but blood present.  Patient has had no nausea, no pain.  No  fevers, chills.   PAST MEDICAL HISTORY:  Orthostatic hypotension.   MEDICATIONS:  1. Aspirin 325 mg a day.  2. Midodrine 2.5 mg t.i.d.   ALLERGIES:  She reports an intolerance to CODEINE and ERYTHROMYCIN.   SOCIAL HISTORY:  She lives alone and she has no smoking history.  No  heavy drinking history.   FAMILY HISTORY:  Her brother had mouth cancer.  Her other brother had a  stroke.   SURGICAL HISTORY:  Hysterectomy.   REVIEW OF SYSTEMS:  As above otherwise negative.   PHYSICAL EXAMINATION:  VITAL SIGNS:  Temperature is 97.8, blood pressure  in the ER is ranging 102-130 over 40-78, pulse 87, respiratory rate 20,  oxygen saturation 97% on room air.  GENERAL:  The patient is well-nourished, well-developed in no acute  distress.  HEENT:  Normocephalic, atraumatic.  Pupils equal, round, reactive to  light.  Slightly pale conjunctiva.  Moist mucous membranes.  NECK:  Supple.  No lymphadenopathy.  LUNGS:  Clear to auscultation bilaterally without wheezes, rhonchi or  rales.  CARDIOVASCULAR:  Regular  rate and rhythm without murmurs, gallops or  rubs.  ABDOMEN:  Normal bowel sounds, soft, nontender, nondistended.  GU:  Deferred.  RECTAL:  Exam per ED physician showed no mass.  No hemorrhoids.  Blood  present.  EXTREMITIES:  No clubbing, cyanosis, she has trace edema.  SKIN:  No rash.  PSYCHIATRIC:  Normal affect.  NEUROLOGIC:  She is alert and oriented.  Cranial nerves and sensorimotor  exam are intact.   LABS:  CBC significant for a hemoglobin of 10.4, hematocrit 32 otherwise  unremarkable.  There is no hemoglobin in the chart for comparison.  Complete metabolic panel significant for a BUN of 21, creatinine 0.9,  albumin 3.0 otherwise unremarkable.   ASSESSMENT AND PLAN:  1. Lower gastrointestinal bleed, possibly diverticular:  The patient      will be monitored.  Will be admitted.  I will place her on clear      liquids.  She will get serial H and H.  She will at some point  need      a colonoscopy as she has never had one.  Fall precautions.  Hold      aspirin and check coagulation panel.  Transfuse if hemoglobin less      than 8.  She will get sequential compression devices.  2. Mild anemia.  No baseline hemoglobin for comparison.  3. History of orthostatic hypotension.      Corinna L. Lendell Caprice, MD  Electronically Signed     CLS/MEDQ  D:  04/13/2009  T:  04/13/2009  Job:  [id number]   cc:   Dario Guardian, M.D.  Fax: [phone]

## 2011-01-12 NOTE — Op Note (Signed)
NAMESARAYA, [name]             ACCOUNT NO.:  [id number]   MEDICAL RECORD NO.:  [id number]          PATIENT TYPE:  INP   LOCATION:  1335                         FACILITY:  [facility]   PHYSICIAN:  Graylin Shiver, M.D.   DATE OF BIRTH:  [birth year]   DATE OF PROCEDURE:  04/15/2009  DATE OF DISCHARGE:                               OPERATIVE REPORT   PROCEDURE:  Colonoscopy with biopsy.   INDICATION FOR PROCEDURE:  Rectal bleeding.   Informed consent was obtained after explanation of the risks of  bleeding, infection and perforation.   PREMEDICATION:  Fentanyl 80 mcg IV, Versed 8 mg IV.   DESCRIPTION OF PROCEDURE:  With the patient in the left lateral  decubitus position, a rectal exam was performed.  No masses were felt.  The Pentax colonoscope was inserted into the rectum and advanced around  a very tortuous colon to the level of the hepatic flexure.  I could not  advance the scope beyond the hepatic flexure.  The transverse colon  looked normal.  The descending colon looked normal.  There were rare  diverticula in the sigmoid.  There was a mass in the sigmoid colon at 20  cm from the anal verge.  This looked to be about 3-4 cm in size and it  was biopsied for histology.  The area above and below the mass was then  tattooed with [place] ink.  The rectum looked normal.  She tolerated the  procedure well without complications.   IMPRESSION:  1. Mass in the sigmoid colon at 20 cm.  This was biopsied and the area      above and below was tattooed with [place] ink.  2. Rare diverticula in the sigmoid.   PLAN:  Check biopsies.   Of note I was unable to advance the scope beyond the hepatic flexure,  therefore, the ascending colon and cecum were not inspected.           ______________________________  Graylin Shiver, M.D.     SFG/MEDQ  D:  04/16/2009  T:  04/16/2009  Job:  [id number]

## 2011-01-12 NOTE — Consult Note (Signed)
NAME:  [name], [name]             ACCOUNT NO.:  [id number]   MEDICAL RECORD NO.:  [id number]          PATIENT TYPE:  INP   LOCATION:  1335                         FACILITY:  [facility]   PHYSICIAN:  Graylin Shiver, M.D.   DATE OF BIRTH:  1928/04/18   DATE OF CONSULTATION:  04/14/2009  DATE OF DISCHARGE:                                 CONSULTATION   REASON FOR CONSULTATION:  The patient is a 77 year old female who was  admitted to the hospital yesterday because of bright red rectal  bleeding.  She has not had any further bleeding today.  The patient has  never had a colonoscopy.  She has remained clinically stable while in  the hospital.  Dr. Lendell Caprice called requesting further evaluation  including colonoscopy for the patient.  The patient has no complaints of  abdominal pain.   PAST HISTORY:  History of orthostatic hypotension.   MEDICATIONS:  1. Aspirin.  2. Midodrine.   PAST SURGERIES:  Hysterectomy.   ALLERGIES:  INTOLERANCE TO CODEINE AND ERYTHROMYCIN.   SOCIAL HISTORY:  Does not smoke or drink alcohol.   FAMILY HISTORY:  Negative for colon cancer.   SYSTEMS REVIEW:  No complaints of chest pain or shortness of breath.   PHYSICAL:  She is in no acute distress.  Nonicteric.  HEART:  Regular rhythm.  No murmurs.  LUNGS:  Clear.  ABDOMEN:  Soft, nontender.   Hemoglobin and hematocrit today 9.1 and 28.5 respectively.  Prothrombin  time normal.   IMPRESSION:  Rectal bleeding, etiology unclear.   PLAN:  We will schedule patient for colonoscopy tomorrow.           ______________________________  Graylin Shiver, M.D.     SFG/MEDQ  D:  04/14/2009  T:  04/14/2009  Job:  [id number]   cc:   Corinna L. Lendell Caprice, MD   Dario Guardian, M.D.  Fax: [phone]

## 2011-01-14 ENCOUNTER — Encounter: Payer: Self-pay | Admitting: Family Medicine

## 2011-01-15 ENCOUNTER — Ambulatory Visit (INDEPENDENT_AMBULATORY_CARE_PROVIDER_SITE_OTHER): Payer: Medicare Other | Admitting: Internal Medicine

## 2011-01-15 ENCOUNTER — Encounter: Payer: Self-pay | Admitting: Internal Medicine

## 2011-01-15 VITALS — BP 120/70 | HR 74 | Temp 97.8°F | Ht 64.0 in | Wt 155.0 lb

## 2011-01-15 DIAGNOSIS — R42 Dizziness and giddiness: Secondary | ICD-10-CM

## 2011-01-15 DIAGNOSIS — I951 Orthostatic hypotension: Secondary | ICD-10-CM

## 2011-01-15 MED ORDER — FLUDROCORTISONE ACETATE 0.1 MG PO TABS: 0.1000 mg | ORAL_TABLET | Freq: Every day | ORAL | Status: DC

## 2011-01-15 NOTE — Progress Notes (Signed)
  Subjective:    Patient ID: [name], female    DOB: [birth year], 77 y.o.   MRN: [id number]  HPI Here with granddaughter  Has been having troubles for a couple of months Dizzy when she gets out of bed Forgets to "take her time" Notes it fairly quickly ---usually when in the bathroom Takes a couple of hours to go away No syncope or falls  Occ nausea but no vomiting Has to sit back down again at that point  No chest pain No SOB  Uses diazepam for bad dizziness--tries to limit No true vertigo but occ gets similar sensation just turning in bed  Current outpatient prescriptions:aspirin 325 MG tablet, Take 325 mg by mouth daily.  , Disp: , Rfl: ;  diazepam (VALIUM) 2 MG tablet, Take 2 mg by mouth daily as needed.  , Disp: , Rfl: ;  DISCONTD: aspirin 81 MG tablet, Take 81 mg by mouth daily.  , Disp: , Rfl:   Past Medical History  Diagnosis Date  . Colon cancer     h/o colon CA 2010- colectomy 2010  (Dr. Gaylyn Rong with oncology)  . Anxiety     H/o anxiety, would use as needed diazepam with relief  . Thyroid disease     H/o thyroid nodule with benign bx 2010  . Orthostasis     h/o orthostasis, didn't tolerate midodrine, prev eval by Dr. Anne Fu    Past Surgical History  Procedure Date  . US echocardiography [id number]    normal  . Abdominal hysterectomy   . Partial colectomy 2010    for colon CA    No family history on file.  History   Social History  . Marital Status: Widowed    Spouse Name: N/A    Number of Children: 1  . Years of Education: N/A   Occupational History  . retired    Social History Main Topics  . Smoking status: Never Smoker   . Smokeless tobacco: Not on file  . Alcohol Use: Yes     rare  . Drug Use: No  . Sexually Active: Not on file   Other Topics Concern  . Not on file   Social History Narrative   Widow x2, lives alone. Drives, independent. 1 adopted daughter, leaves near patient. 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 No distress.  Neck: Normal range of motion. Neck supple. No thyromegaly present.  Cardiovascular: Normal rate, regular rhythm and normal heart sounds.  Exam reveals no gallop.   No murmur heard. Pulmonary/Chest: Effort normal and breath sounds normal. No respiratory distress. She has no wheezes. She has no rales.  Abdominal: Soft.       Low grade sensitivity  Musculoskeletal: Normal range of motion. She exhibits no edema and no tenderness.  Lymphadenopathy:    She has no cervical adenopathy.  Psychiatric: She has a normal mood and affect. Her behavior is normal. Judgment and thought content normal.          Assessment & Plan:

## 2011-01-28 ENCOUNTER — Telehealth: Payer: Self-pay | Admitting: *Deleted

## 2011-01-28 ENCOUNTER — Ambulatory Visit (INDEPENDENT_AMBULATORY_CARE_PROVIDER_SITE_OTHER): Payer: Medicare Other | Admitting: Internal Medicine

## 2011-01-28 ENCOUNTER — Encounter: Payer: Self-pay | Admitting: Internal Medicine

## 2011-01-28 DIAGNOSIS — R42 Dizziness and giddiness: Secondary | ICD-10-CM

## 2011-01-28 NOTE — Telephone Encounter (Signed)
Molli Knock  Will review with her this afternoon

## 2011-01-28 NOTE — Assessment & Plan Note (Signed)
Seems like there may be a vestibular component Not really better with the flurinef and she wants to avoid meds---will stop Consider meclizine Would restart flurinef is she notes worsening when she stops

## 2011-01-28 NOTE — Progress Notes (Signed)
  Subjective:    Patient ID: [name], female    DOB: 1928/07/14, 77 y.o.   MRN: [id number]  HPI Here with close friend Daughter couldn't make it    Getting sick every morning Starts with dizziness --can sometimes tell this is going to happen even while still in bed Passed out at [facility] inn last week---lasted "just a second" Didn't actually fall--just guided to chair Wasn't unconscious but couldn't speak---was standing paying the bill at that time  No real improvement on the flurinef Does get symptoms if she turns quickly--also if she gets up quickly Drinks powerade every day  Current outpatient prescriptions:aspirin 325 MG tablet, Take 325 mg by mouth daily.  , Disp: , Rfl: ;  fludrocortisone (FLORINEF) 0.1 MG tablet, Take 1 tablet (0.1 mg total) by mouth daily., Disp: 30 tablet, Rfl: 11  Past Medical History  Diagnosis Date  . Colon cancer     h/o colon CA 2010- colectomy 2010  (Dr. Gaylyn Rong with oncology)  . Anxiety     H/o anxiety, would use as needed diazepam with relief  . Thyroid disease     H/o thyroid nodule with benign bx 2010  . Orthostasis     h/o orthostasis, didn't tolerate midodrine, prev eval by Dr. Anne Fu    Past Surgical History  Procedure Date  . US echocardiography [id number]    normal  . Abdominal hysterectomy   . Partial colectomy 2010    for colon CA    No family history on file.  History   Social History  . Marital Status: Widowed    Spouse Name: N/A    Number of Children: 1  . Years of Education: N/A   Occupational History  . retired    Social History Main Topics  . Smoking status: Never Smoker   . Smokeless tobacco: Not on file  . Alcohol Use: Yes     rare  . Drug Use: No  . Sexually Active: Not on file   Other Topics Concern  . Not on file   Social History Narrative   Widow x2, lives alone. Drives, independent. 1 adopted daughter, leaves near patient. 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 No distress.  Neck: Normal range of motion. Neck supple.  Cardiovascular: Normal rate, regular rhythm and normal heart sounds.  Exam reveals no gallop.   No murmur heard. Pulmonary/Chest: Effort normal and breath sounds normal. No respiratory distress. She has no wheezes. She has no rales.  Lymphadenopathy:    She has no cervical adenopathy.  Psychiatric: She has a normal mood and affect. Her behavior is normal. Judgment and thought content normal.          Assessment & Plan:

## 2011-01-28 NOTE — Patient Instructions (Addendum)
Continue the powerade lately Try off the fludrocortisone but call if you feel worse off it If you have more dizziness, you can try meclizine 12.5-25 mg two or three times a day to see if that helps Please cancel next week's visit

## 2011-01-28 NOTE — Telephone Encounter (Signed)
Pt complains of nausea, thinks the fludrocortisone is making her worse, says she is not going to take it today. She has appt to see you this afternoon but wanted to come this morning.  I advised her that you are not in until this afternoon, advised her to rest today, dont take the medicine until she sees you this afternoon.

## 2011-02-01 ENCOUNTER — Emergency Department (HOSPITAL_COMMUNITY): Payer: Medicare Other

## 2011-02-01 ENCOUNTER — Inpatient Hospital Stay (HOSPITAL_COMMUNITY)
Admission: EM | Admit: 2011-02-01 | Discharge: 2011-02-04 | DRG: 312 | Disposition: A | Discharge status: Home / Self Care | Payer: Medicare Other | Attending: Internal Medicine | Admitting: Internal Medicine

## 2011-02-01 ENCOUNTER — Inpatient Hospital Stay (HOSPITAL_COMMUNITY): Payer: Medicare Other

## 2011-02-01 DIAGNOSIS — Z9049 Acquired absence of other specified parts of digestive tract: Secondary | ICD-10-CM

## 2011-02-01 DIAGNOSIS — K5900 Constipation, unspecified: Secondary | ICD-10-CM | POA: Diagnosis present

## 2011-02-01 DIAGNOSIS — I951 Orthostatic hypotension: Principal | ICD-10-CM | POA: Diagnosis present

## 2011-02-01 DIAGNOSIS — Z9119 Patient's noncompliance with other medical treatment and regimen: Secondary | ICD-10-CM

## 2011-02-01 DIAGNOSIS — I7389 Other specified peripheral vascular diseases: Secondary | ICD-10-CM | POA: Diagnosis present

## 2011-02-01 DIAGNOSIS — R110 Nausea: Secondary | ICD-10-CM | POA: Diagnosis present

## 2011-02-01 DIAGNOSIS — I253 Aneurysm of heart: Secondary | ICD-10-CM | POA: Diagnosis present

## 2011-02-01 DIAGNOSIS — Z91199 Patient's noncompliance with other medical treatment and regimen due to unspecified reason: Secondary | ICD-10-CM

## 2011-02-01 DIAGNOSIS — Z85038 Personal history of other malignant neoplasm of large intestine: Secondary | ICD-10-CM

## 2011-02-01 LAB — URINALYSIS, ROUTINE W REFLEX MICROSCOPIC
Glucose, UA: NEGATIVE mg/dL
Leukocytes, UA: NEGATIVE
Protein, ur: NEGATIVE mg/dL
Specific Gravity, Urine: 1.015 (ref 1.005–1.030)
Urobilinogen, UA: 0.2 mg/dL (ref 0.0–1.0)

## 2011-02-01 LAB — CBC
HCT: 41.4 % (ref 36.0–46.0)
MCH: 28.2 pg (ref 26.0–34.0)
MCHC: 31.6 g/dL (ref 30.0–36.0)
MCV: 89 fL (ref 78.0–100.0)
RDW: 14.4 % (ref 11.5–15.5)

## 2011-02-01 LAB — BASIC METABOLIC PANEL
BUN: 15 mg/dL (ref 6–23)
Calcium: 9.5 mg/dL (ref 8.4–10.5)
Creatinine, Ser: 0.81 mg/dL (ref 0.4–1.2)
GFR calc non Af Amer: >60 mL/min (ref >60)
Glucose, Bld: 87 mg/dL (ref 70–99)
Potassium: 4 mEq/L (ref 3.5–5.1)

## 2011-02-01 LAB — DIFFERENTIAL
Eosinophils Relative: 2 % (ref 0–5)
Lymphocytes Relative: 22 % (ref 12–46)
Lymphs Abs: 2.2 10*3/uL (ref 0.7–4.0)
Monocytes Absolute: 0.5 10*3/uL (ref 0.1–1.0)
Monocytes Relative: 5 % (ref 3–12)

## 2011-02-01 LAB — URINE MICROSCOPIC-ADD ON

## 2011-02-02 LAB — URINE CULTURE

## 2011-02-03 LAB — BASIC METABOLIC PANEL
BUN: 9 mg/dL (ref 6–23)
GFR calc non Af Amer: >60 mL/min (ref >60)
Potassium: 3.5 mEq/L (ref 3.5–5.1)

## 2011-02-04 ENCOUNTER — Ambulatory Visit: Payer: 59 | Admitting: Family Medicine

## 2011-02-04 ENCOUNTER — Ambulatory Visit: Payer: 59 | Admitting: Internal Medicine

## 2011-02-09 NOTE — Discharge Summary (Signed)
[name], [name]             ACCOUNT NO.:  [id number]  MEDICAL RECORD NO.:  [id number]  LOCATION:  1420                         FACILITY:  [facility]  PHYSICIAN:  Hillery Aldo, M.D.   DATE OF BIRTH:  1928/08/03  DATE OF ADMISSION:  02/01/2011 DATE OF DISCHARGE:  02/04/2011                              DISCHARGE SUMMARY   PRIMARY CARE PHYSICIAN:  Tillman Abide, MD  CARDIOLOGIST:  Jake Bathe, MD  DISCHARGE DIAGNOSES: 1. Orthostatic hypotension. 2. Atrial septal aneurysm incidentally discovered on two-dimensional     echocardiogram. 3. Chronic nausea. 4. History of thyroid mass. 5. History of adenocarcinoma of colon, status post resection. 6. Constipation. 7. Mild chronic small-vessel ischemic cardiovascular disease.  DISCHARGE MEDICATIONS: 1. Fludrocortisone 0.1 mg p.o. daily. 2. Zofran 4 mg p.o. 30 minutes prior to fludrocortisone dose p.r.n.     nausea. 3. Enteric-coated aspirin 81 mg p.o. daily. 4. Meclizine 12.5 mg p.o. q.h.s.  CONSULTATIONS:  Physical and Occupational Therapy.  BRIEF ADMISSION HPI:  The patient is an 77 year old female with a history of chronic dizziness and chronic orthostatic hypotension who has been maintained on fludrocortisone in the past.  She also has been on midodrine for symptomatic orthostasis.  She apparently went off the fludrocortisone secondary to problems tolerating this due to nausea. Although the patient was still having some nausea, she presented to the hospital with dizziness and unsteadiness in her gait and upon initial evaluation was found to be significantly orthostatic.  She was subsequently referred to the hospitalist service for further evaluation and treatment.  For full details, please see the dictated report done by Dr. Ashley Royalty.  PROCEDURES AND DIAGNOSTIC STUDIES: 1. CT scan of the head on February 01, 2011 showed no acute intracranial     normality.  Mild chronic small vessel ischemic white matter  demyelination. 2. Two-dimensional echocardiogram on February 02, 2011, showed normal     systolic function with an ejection fraction estimated 55-60%.  No     regional wall motion abnormalities.  Atrial septal aneurysm.  DISCHARGE LABORATORY VALUES:  Sodium was 139, potassium 3.5, chloride 105, bicarb 29, BUN 9, creatinine 0.85, glucose 101, calcium 8.4.  Urine cultures were negative.  Random cortisol was 12.9.  TSH was 1.055.  HOSPITAL COURSE BY PROBLEM: 1. Orthostatic hypotension:  Felt to be triggered by nonadherence to     fludrocortisone.  The patient was put back on fludrocortisone with     premedication with Zofran to address her nausea and after 48 hours     her orthostatic symptoms improved.  She is still somewhat     orthostatic with her blood pressure going from 152/76 line to     145/80 sitting to 123/73 standing, but she is no longer symptomatic     with these changes.  She is also provided with knee-high TED hose     and instructed to wear these during the day. 2. Atrial septal aneurysm:  Incidentally discovered.  Can follow up     with Dr. Anne Fu. 3. Acute on chronic nausea:  The patient was put on daily Zofran as     premedication for fludrocortisone.  She is instructed that she can  discontinue this and only take this as needed in the future once     her nausea symptoms are under better control.  The patient's other chronic medical problems were stable throughout this hospital stay.  DISPOSITION:  The patient is medically stable and will be discharged home.  DISCHARGE DIET:  Heart healthy.  DISCHARGE INSTRUCTIONS:  Follow up with Dr. Anne Fu and with Dr. Alphonsus Sias as needed.  Wear TED hose during the day and take these off at night.  CONDITION ON DISCHARGE:  Improved.  Time spent coordinating care for discharge and discharge instructions including face-to-face time equals approximately 35 minutes.     Hillery Aldo, M.D.     CR/MEDQ  D:  02/04/2011  T:   02/04/2011  Job:  [id number]  cc:   Karie Schwalbe, MD [address] [place], [place] [address]  Jake Bathe, MD Fax: [phone]  Electronically Signed by Hillery Aldo M.D. on 02/09/2011 06:59:38 AM

## 2011-02-16 ENCOUNTER — Ambulatory Visit (INDEPENDENT_AMBULATORY_CARE_PROVIDER_SITE_OTHER): Payer: Medicare Other | Admitting: Internal Medicine

## 2011-02-16 ENCOUNTER — Encounter: Payer: Self-pay | Admitting: Internal Medicine

## 2011-02-16 VITALS — BP 122/60 | HR 70 | Temp 97.9°F | Ht 64.0 in | Wt 167.0 lb

## 2011-02-16 DIAGNOSIS — I951 Orthostatic hypotension: Secondary | ICD-10-CM

## 2011-02-16 LAB — BASIC METABOLIC PANEL
CO2: 28 mEq/L (ref 19–32)
Calcium: 9.1 mg/dL (ref 8.4–10.5)
GFR: 59.25 mL/min — ABNORMAL LOW (ref >60.00)
Sodium: 141 mEq/L (ref 135–145)

## 2011-02-16 NOTE — Progress Notes (Signed)
  Subjective:    Patient ID: [name], female    DOB: 1927-12-02, 77 y.o.   MRN: [id number]  HPI Here with daughter  Mostly feels better since being in hospital Hasn't been taking the meclizine Now using the zofran before the fludrocortisone and that mostly prevents the nausea feeling it was causing  Appetite is okay Had vestibular testing in hospital which was normal  No syncope since in hospital Still gets dizzy if she stands up quickly No limitations due to dizziness but does have to start slow in the morning due to nausea Still drinks powerade daily  Current Outpatient Prescriptions on File Prior to Visit  Medication Sig Dispense Refill  . DISCONTD: aspirin 325 MG tablet Take 325 mg by mouth daily.         Past Medical History  Diagnosis Date  . Colon cancer     h/o colon CA 2010- colectomy 2010  (Dr. Gaylyn Rong with oncology)  . Anxiety     H/o anxiety, would use as needed diazepam with relief  . Thyroid disease     H/o thyroid nodule with benign bx 2010  . Orthostasis     h/o orthostasis, didn't tolerate midodrine, prev eval by Dr. Anne Fu    Past Surgical History  Procedure Date  . US echocardiography [id number]    normal  . Abdominal hysterectomy   . Partial colectomy 2010    for colon CA    No family history on file.  History   Social History  . Marital Status: Widowed    Spouse Name: N/A    Number of Children: 1  . Years of Education: N/A   Occupational History  . retired    Social History Main Topics  . Smoking status: Never Smoker   . Smokeless tobacco: Not on file  . Alcohol Use: Yes     rare  . Drug Use: No  . Sexually Active: Not on file   Other Topics Concern  . Not on file   Social History Narrative   Widow x2, lives alone. Drives, independent. 1 adopted daughter, leaves near patient. 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 No distress.  Neck: Normal range of motion. Neck supple. No thyromegaly present.  Cardiovascular: Normal rate, regular rhythm, normal heart sounds and intact distal pulses.  Exam reveals no gallop.   No murmur heard. Pulmonary/Chest: Effort normal and breath sounds normal. No respiratory distress. She has no wheezes. She has no rales.  Musculoskeletal: Normal range of motion. She exhibits no edema and no tenderness.  Lymphadenopathy:    She has no cervical adenopathy.  Psychiatric: She has a normal mood and affect. Her behavior is normal. Judgment and thought content normal.          Assessment & Plan:

## 2011-02-16 NOTE — Assessment & Plan Note (Signed)
Better now Tolerating the fludrocortisone Will consider increasing to bid No edema Will check met b

## 2011-02-20 ENCOUNTER — Other Ambulatory Visit: Payer: Self-pay | Admitting: Family Medicine

## 2011-02-22 NOTE — Telephone Encounter (Signed)
Spoke with patient and advised results   

## 2011-02-22 NOTE — Telephone Encounter (Signed)
She might have had this in the past for dizziness but it is not on her list now and should not be refilled

## 2011-02-23 ENCOUNTER — Other Ambulatory Visit: Payer: Self-pay | Admitting: *Deleted

## 2011-02-23 MED ORDER — DIAZEPAM 2 MG PO TABS: 2.0000 mg | ORAL_TABLET | Freq: Four times a day (QID) | ORAL | Status: AC | PRN

## 2011-02-23 NOTE — Telephone Encounter (Signed)
Please call in

## 2011-02-24 NOTE — Telephone Encounter (Signed)
Rx called to [facility].

## 2011-03-05 ENCOUNTER — Ambulatory Visit: Payer: Medicare Other | Admitting: Internal Medicine

## 2011-03-15 NOTE — H&P (Signed)
[name], [name]             ACCOUNT NO.:  [id number]  MEDICAL RECORD NO.:  [id number]  LOCATION:  WLED                         FACILITY:  [facility]  PHYSICIAN:  Altha Harm, MDDATE OF BIRTH:  Dec 02, 1927  DATE OF ADMISSION:  02/01/2011 DATE OF DISCHARGE:                             HISTORY & PHYSICAL   PRIMARY CARE PHYSICIAN:  Karie Schwalbe, MD  CARDIOLOGIST:  Donato Schultz, MD  CHIEF COMPLAINT:  Dizziness and near syncope.  HISTORY OF PRESENT ILLNESS:  [name] is an 77 year old very pleasant lady who apparently has been having bouts of dizziness on and off for several years.  In further discussion with her, it seems that the patient had been on midodrine and fludrocortisone, which she felt that she did not tolerate well and then has recently been on meclizine. The patient states that she has been having some nausea and dizziness and unsteadiness in the morning, which after about 2 hours usually wears off.  She states, however, today she got up and she felt more dizzy than she normally does and felt like she was going to pass out.  She states that she sat down and then called her daughter [name] and asked that she be brought to the emergency room for further evaluation.  The patient states that she had no visual changes, no blurring of vision, no double vision.  She had no chest pain.  She has no problems with elimination, either bowel movements or urine.  She has had no significant weight loss.  She has had no vomiting but she does have nausea which she says improves after she takes orange juice.  The patient states that as long as she is in the laying down position, her dizziness is absent.  The patient denies any palpitations associated with this.  PAST MEDICAL HISTORY:  Significant for: 1. Stage I colon adenocarcinoma, status post partial colon resection. 2. Orthostatic hypotension. 3. Thyroid mass which was felt to be benign.  FAMILY HISTORY:  Not  significant for any real chronic illnesses.  SOCIAL HISTORY:  The patient resides alone, however, her daughter Ms. Hermelinda Dellen lives right across the street from her and she could be reached at [phone].  There is no tobacco, alcohol or drug use.  CURRENT MEDICATIONS:  Include meclizine and aspirin.  ALLERGIES:  SULFA, CODEINE, NIACIN.  REVIEW OF SYSTEMS:  All other systems negative.  LABORATORY DATA:  Studies in the emergency room:  Hemogram shows a white blood cell count of 9.9, hemoglobin of 13.1, hematocrit of 41.4, platelet count of 210.  Sodium 138, potassium 4.0, chloride 100, bicarb 29, BUN 15, creatinine 0.8.  PHYSICAL EXAMINATION:  GENERAL:  The patient is a well-nourished, well- developed lady who is very alert but mildly hard of hearing. VITAL SIGNS:  Heart rate is 65, blood pressure 119/58, respiratory rate 16.  The patient has positive orthostatics with her systolic  blood pressure dropping into the 60s upon standing, and her saturations are 100% on room air. HEENT:  Normocephalic, atraumatic.  Pupils equally round and reactive to light and accommodation.  Bilateral arcus senilis.  Fundi are benign. Oropharynx is moist.  No exudates, erythema or lesions noted. NECK:  Trachea is midline.  No masses, no thyromegaly, no JVD, no carotid bruit. RESPIRATORY:  Normal respiratory effort, equal excursions bilaterally. No wheezing or rhonchi noted. CARDIOVASCULAR:  Normal S1, S2.  No murmurs, rubs or gallops are noted. PMI is nondisplaced.  No heaves or thrills on palpation. ABDOMEN:  Obese, soft, nontender, nondistended.  No masses, no hepatosplenomegaly noted. EXTREMITIES:  No clubbing, cyanosis or edema. LYMPH NODE SURVEY:  No cervical, axillary or inguinal lymphadenopathy noted. NEUROLOGIC:  No focal neurological deficits.  Cranial nerves II-XII are grossly intact. PSYCHIATRIC:  Alert and oriented x3, good insight and cognition, good recent and remote  recall.  ASSESSMENT AND PLAN:  This is a patient who presents with profound orthostatic hypotension.  The patient does have known orthostatic hypotension, however, we do not have details of her workup here.  I will ask that her records be sent from her doctor's office, both Dr. Alphonsus Sias and Dr. Anne Fu as she probably has had a workup on this in the past.  I am getting a CT scan of her head only because the patient does have a history of colon cancer.  She is now having worsening of her symptoms and just to ensure that there is not something intracranially that could be contributing to this.  The patient has no respiratory symptoms, thus I do not think we need to pursue any respiratory areas as electrolytes are all normal.  I am unsure as to whether or not the patient has an echocardiogram.  We will try to obtain from Dr. Anne Fu office whether or not she has had one done recently.  If she has not, then it would not be unreasonable to do an echocardiogram just to evaluate her left ventricular function.  In the meantime, I am going to put her back on the midodrine 2.5 mg p.o. t.i.d. in addition to her meclizine and we will check orthostatics.  I do not think this patient is volume depleted by any means but I do think that she certainly has autonomic dysfunction that it is causing her orthostasis.  We will check a cortisol level to ensure that she does not need other support in those areas.  And, we will check her blood sugars, fasting, to ensure that she is not hypoglycemic in the mornings when she is having symptoms.  In terms of deep venous thrombosis prophylaxis, she will be on Lovenox.  We will ask PT and OT to see her to evaluate her function.  The patient is being admitted to team 1.     Altha Harm, MD     MAM/MEDQ  D:  02/01/2011  T:  02/01/2011  Job:  [id number]  cc:   Karie Schwalbe, MD [address] [place], [place] [address]  Jake Bathe, MD Fax:  [phone]  Electronically Signed by Marthann Schiller MD on 03/15/2011 06:03:26 PM

## 2011-03-16 ENCOUNTER — Emergency Department (HOSPITAL_COMMUNITY): Payer: Medicare Other

## 2011-03-16 ENCOUNTER — Emergency Department (HOSPITAL_COMMUNITY)
Admission: EM | Admit: 2011-03-16 | Discharge: 2011-03-16 | Disposition: A | Discharge status: Home / Self Care | Payer: Medicare Other | Attending: Emergency Medicine | Admitting: Emergency Medicine

## 2011-03-16 DIAGNOSIS — K5900 Constipation, unspecified: Secondary | ICD-10-CM | POA: Insufficient documentation

## 2011-03-16 DIAGNOSIS — R109 Unspecified abdominal pain: Secondary | ICD-10-CM | POA: Insufficient documentation

## 2011-03-16 DIAGNOSIS — Z85038 Personal history of other malignant neoplasm of large intestine: Secondary | ICD-10-CM | POA: Insufficient documentation

## 2011-03-16 DIAGNOSIS — R110 Nausea: Secondary | ICD-10-CM | POA: Insufficient documentation

## 2011-03-16 DIAGNOSIS — Z9049 Acquired absence of other specified parts of digestive tract: Secondary | ICD-10-CM | POA: Insufficient documentation

## 2011-03-19 ENCOUNTER — Ambulatory Visit: Payer: Medicare Other | Admitting: Internal Medicine

## 2011-03-22 ENCOUNTER — Ambulatory Visit (INDEPENDENT_AMBULATORY_CARE_PROVIDER_SITE_OTHER): Payer: Medicare Other | Admitting: Internal Medicine

## 2011-03-22 ENCOUNTER — Encounter: Payer: Self-pay | Admitting: Internal Medicine

## 2011-03-22 DIAGNOSIS — C189 Malignant neoplasm of colon, unspecified: Secondary | ICD-10-CM

## 2011-03-22 DIAGNOSIS — K5909 Other constipation: Secondary | ICD-10-CM | POA: Insufficient documentation

## 2011-03-22 DIAGNOSIS — I951 Orthostatic hypotension: Secondary | ICD-10-CM

## 2011-03-22 NOTE — Assessment & Plan Note (Signed)
Doing well on the fludrocortisone Will see if she can tolerate it without the zofran pretreatment

## 2011-03-22 NOTE — Progress Notes (Signed)
  Subjective:    Patient ID: [name], female    DOB: 1927-10-12, 77 y.o.   MRN: [id number]  HPI Was seen in the ER last week Having abdominal pain and constipated Straining without effect Given 2 enemas with some relief (suppositories hadn't helped at home) Had been using MOM caplets ~every other day or so Had been using miralax but not regularly  Dizziness has been better Believes the fludrocortisone is helping No nausea --she is using the zofran before still No falls No syncope Home BP 107/73-133/82  Current Outpatient Prescriptions on File Prior to Visit  Medication Sig Dispense Refill  . aspirin 81 MG EC tablet Take 81 mg by mouth daily.        . fludrocortisone (FLORINEF) 0.1 MG tablet Take 0.1 mg by mouth daily.       . ondansetron (ZOFRAN) 4 MG tablet Take 4 mg by mouth. 30 minutes prior to fludrocortisone as needed for nausea        Allergies  Allergen Reactions  . Erythromycin     REACTION: GI upset  . Sulfonamide Derivatives     REACTION: GI upset    Past Medical History  Diagnosis Date  . Colon cancer     h/o colon CA 2010- colectomy 2010  (Dr. Gaylyn Rong with oncology)  . Anxiety     H/o anxiety, would use as needed diazepam with relief  . Thyroid disease     H/o thyroid nodule with benign bx 2010  . Orthostasis     h/o orthostasis, didn't tolerate midodrine, prev eval by Dr. Anne Fu    Past Surgical History  Procedure Date  . US echocardiography [id number]    normal  . Abdominal hysterectomy   . Partial colectomy 2010    for colon CA    No family history on file.  History   Social History  . Marital Status: Widowed    Spouse Name: N/A    Number of Children: 1  . Years of Education: N/A   Occupational History  . retired    Social History Main Topics  . Smoking status: Never Smoker   . Smokeless tobacco: Never Used  . Alcohol Use: Yes     rare  . Drug Use: No  . Sexually Active: Not on file   Other Topics Concern  . Not on file    Social History Narrative   Widow x2, lives alone. Drives, independent. 1 adopted daughter, leaves near patient. 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 No distress.  Neck: Normal range of motion. Neck supple. No thyromegaly present.  Cardiovascular: Normal rate, regular rhythm and normal heart sounds.  Exam reveals no gallop.   No murmur heard. Pulmonary/Chest: Effort normal and breath sounds normal. No respiratory distress. She has no wheezes. She has no rales.  Abdominal: Soft. Bowel sounds are normal. She exhibits no distension and no mass. There is no tenderness.  Musculoskeletal: Normal range of motion. She exhibits no edema and no tenderness.  Lymphadenopathy:    She has no cervical adenopathy.  Psychiatric: She has a normal mood and affect. Her behavior is normal. Judgment and thought content normal.          Assessment & Plan:

## 2011-03-22 NOTE — Assessment & Plan Note (Signed)
Did finally improved with enemas Advised that she take the miralax daily and only cut back if going way too much

## 2011-03-22 NOTE — Patient Instructions (Addendum)
Please use the miralax every day and if stools are too frequent, cut back to every other day Please try not using the ondansetron. If you get nauseated taking the fludrocortisone again, resume the ondansetron I would recommend scheduling your follow up colonoscopy (Dr Evette Cristal?)

## 2011-03-22 NOTE — Assessment & Plan Note (Signed)
She asks me about pursuing follow up colon Since she is in good shape, and the cancer was recent, I advise proceeding with her follow up colonoscopy

## 2011-04-14 ENCOUNTER — Ambulatory Visit: Payer: Medicare Other | Admitting: Internal Medicine

## 2011-05-10 ENCOUNTER — Encounter (HOSPITAL_BASED_OUTPATIENT_CLINIC_OR_DEPARTMENT_OTHER): Payer: Medicare Other | Admitting: Oncology

## 2011-05-10 ENCOUNTER — Other Ambulatory Visit: Payer: Self-pay | Admitting: Oncology

## 2011-05-10 DIAGNOSIS — E041 Nontoxic single thyroid nodule: Secondary | ICD-10-CM

## 2011-05-10 DIAGNOSIS — C189 Malignant neoplasm of colon, unspecified: Secondary | ICD-10-CM

## 2011-05-10 DIAGNOSIS — J984 Other disorders of lung: Secondary | ICD-10-CM

## 2011-05-10 LAB — CBC WITH DIFFERENTIAL/PLATELET
BASO%: 0.4 % (ref 0.0–2.0)
Eosinophils Absolute: 0.2 10*3/uL (ref 0.0–0.5)
MCHC: 33 g/dL (ref 31.5–36.0)
MCV: 87.4 fL (ref 79.5–101.0)
MONO#: 0.4 10*3/uL (ref 0.1–0.9)
MONO%: 6.2 % (ref 0.0–14.0)
NEUT#: 4.3 10*3/uL (ref 1.5–6.5)
RBC: 4.37 10*6/uL (ref 3.70–5.45)
RDW: 15.4 % — ABNORMAL HIGH (ref 11.2–14.5)
WBC: 6.9 10*3/uL (ref 3.9–10.3)

## 2011-05-10 LAB — COMPREHENSIVE METABOLIC PANEL
ALT: 8 U/L (ref 0–35)
Albumin: 3.8 g/dL (ref 3.5–5.2)
Alkaline Phosphatase: 72 U/L (ref 39–117)
Glucose, Bld: 97 mg/dL (ref 70–99)
Potassium: 3.9 mEq/L (ref 3.5–5.3)
Sodium: 142 mEq/L (ref 135–145)
Total Protein: 6.7 g/dL (ref 6.0–8.3)

## 2011-05-17 ENCOUNTER — Ambulatory Visit (HOSPITAL_COMMUNITY)
Admission: RE | Admit: 2011-05-17 | Discharge: 2011-05-17 | Disposition: A | Discharge status: Home / Self Care | Payer: Medicare Other | Source: Ambulatory Visit | Attending: Oncology | Admitting: Oncology

## 2011-05-17 DIAGNOSIS — E278 Other specified disorders of adrenal gland: Secondary | ICD-10-CM | POA: Insufficient documentation

## 2011-05-17 DIAGNOSIS — K6389 Other specified diseases of intestine: Secondary | ICD-10-CM | POA: Insufficient documentation

## 2011-05-17 DIAGNOSIS — N9489 Other specified conditions associated with female genital organs and menstrual cycle: Secondary | ICD-10-CM | POA: Insufficient documentation

## 2011-05-17 DIAGNOSIS — Z85820 Personal history of malignant melanoma of skin: Secondary | ICD-10-CM | POA: Insufficient documentation

## 2011-05-17 DIAGNOSIS — K7689 Other specified diseases of liver: Secondary | ICD-10-CM | POA: Insufficient documentation

## 2011-05-17 DIAGNOSIS — J90 Pleural effusion, not elsewhere classified: Secondary | ICD-10-CM | POA: Insufficient documentation

## 2011-05-17 DIAGNOSIS — C189 Malignant neoplasm of colon, unspecified: Secondary | ICD-10-CM | POA: Insufficient documentation

## 2011-05-17 DIAGNOSIS — Z90710 Acquired absence of both cervix and uterus: Secondary | ICD-10-CM | POA: Insufficient documentation

## 2011-05-17 MED ORDER — IOHEXOL 300 MG/ML  SOLN
100.0000 mL | Freq: Once | INTRAMUSCULAR | Status: AC | PRN
  Administered 2011-05-17: 100 mL via INTRAVENOUS

## 2011-06-02 ENCOUNTER — Telehealth: Payer: Self-pay | Admitting: Internal Medicine

## 2011-06-02 NOTE — Telephone Encounter (Signed)
[name] took a call from pt stating that her medication Fludrocortisone 0.1mg  is not working and pt states she actually getting worse, pt would like to know what to do? uses [employer].

## 2011-06-02 NOTE — Telephone Encounter (Signed)
Getting some sweats at night Thinks maybe she should stop it  I recommended she take it in the morning If persistent problems with it, would try trial without it again and see if her orthostasis worsens

## 2011-06-03 ENCOUNTER — Telehealth: Payer: Self-pay | Admitting: *Deleted

## 2011-06-03 NOTE — Telephone Encounter (Signed)
Pt's daughter called concerned about pt's ability to remember.  She gets confused about instructions regarding her meds.  Advised her to make appt to come in to discuss.  Appt made for 10/15.  Daughter will be coming in with the patient.  She wants memory testing done.

## 2011-06-05 NOTE — Telephone Encounter (Signed)
Molli Knock  Will review with them when they are here

## 2011-06-14 ENCOUNTER — Encounter: Payer: Self-pay | Admitting: Internal Medicine

## 2011-06-14 ENCOUNTER — Ambulatory Visit (INDEPENDENT_AMBULATORY_CARE_PROVIDER_SITE_OTHER): Payer: Medicare Other | Admitting: Internal Medicine

## 2011-06-14 VITALS — BP 99/58 | HR 78 | Temp 98.7°F | Wt 156.5 lb

## 2011-06-14 DIAGNOSIS — I951 Orthostatic hypotension: Secondary | ICD-10-CM

## 2011-06-14 DIAGNOSIS — R413 Other amnesia: Secondary | ICD-10-CM | POA: Insufficient documentation

## 2011-06-14 DIAGNOSIS — F411 Generalized anxiety disorder: Secondary | ICD-10-CM

## 2011-06-14 NOTE — Assessment & Plan Note (Signed)
Probably the reason for the AM nausea She relates her nausea to the fludrocortisone but this is happening in the morning when the med is due Limited insight about this---she stopped the med  Will add salt tablets Consider increasing fludrocortisone to bid

## 2011-06-14 NOTE — Assessment & Plan Note (Signed)
New problem Pattern is consistent with mild cognitive impairment Will check B12 and thyroid function Consider MRI No Rx for now

## 2011-06-14 NOTE — Progress Notes (Signed)
Subjective:    Patient ID: [name], female    DOB: 1927/10/03, 77 y.o.   MRN: [id number]  HPI Here with daughter again  Concerned about memory--daughter Forgetful about conversation elements--repeats herself some Having trouble using functions on car---daughter shows her how to turn on the headlights (or dim the brights) and she can't retain that info Not really driving in bad weather or at night now Independent with ADLs and instrumental ADLs. Has cleaning lady every 2 weeks Still pays her bills  Now has been sick in the mornings--esp when sitting on the side of the bed Nauseated every morning for past 9 days No vomiting Sounds weak to daughter Sounds like she is having chills Improves as the day goes on Stopped the fludrocortisone 2 days ago Has had some dizzy spells  Current Outpatient Prescriptions on File Prior to Visit  Medication Sig Dispense Refill  . aspirin 81 MG EC tablet Take 81 mg by mouth daily.        . fludrocortisone (FLORINEF) 0.1 MG tablet Take 0.1 mg by mouth every morning.       . ondansetron (ZOFRAN) 4 MG tablet Take 4 mg by mouth. 30 minutes prior to fludrocortisone as needed for nausea      . polyethylene glycol (MIRALAX / GLYCOLAX) packet Take 17 g by mouth daily.          Allergies  Allergen Reactions  . Erythromycin     REACTION: GI upset  . Sulfonamide Derivatives     REACTION: GI upset    Past Medical History  Diagnosis Date  . Colon cancer     h/o colon CA 2010- colectomy 2010  (Dr. Gaylyn Rong with oncology)  . Anxiety     H/o anxiety, would use as needed diazepam with relief  . Thyroid disease     H/o thyroid nodule with benign bx 2010  . Orthostasis     h/o orthostasis, didn't tolerate midodrine, prev eval by Dr. Anne Fu    Past Surgical History  Procedure Date  . US echocardiography [id number]    normal  . Abdominal hysterectomy   . Partial colectomy 2010    for colon CA    No family history on file.  History   Social  History  . Marital Status: Widowed    Spouse Name: N/A    Number of Children: 1  . Years of Education: N/A   Occupational History  . retired    Social History Main Topics  . Smoking status: Never Smoker   . Smokeless tobacco: Never Used  . Alcohol Use: Yes     rare  . Drug Use: No  . Sexually Active: Not on file   Other Topics Concern  . Not on file   Social History Narrative   Widow x2, lives alone. Drives, independent. 1 adopted daughter, leaves near patient. 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 She appears well-developed and well-nourished. No distress.  Neck: Normal range of motion. Neck supple. No thyromegaly present.  Cardiovascular: Normal rate, regular rhythm and normal heart sounds.  Exam reveals no gallop.   No murmur heard. Pulmonary/Chest: Effort normal and breath sounds normal. No respiratory distress. She  has no wheezes. She has no rales.  Musculoskeletal: She exhibits no edema and no tenderness.  Lymphadenopathy:    She has no cervical adenopathy.  Neurological: She is alert and oriented to person, place, and time.       President  "Obama, ......." 100-93-.Marland Kitchen... D-l-o-w Recall 0/3  Psychiatric: She has a normal mood and affect. Her behavior is normal.          Assessment & Plan:

## 2011-06-14 NOTE — Patient Instructions (Signed)
Please start 1 gm salt tablets ---available over the counter---twice a day. Call if the morning nausea continues Please restart the fludrocortisone

## 2011-06-14 NOTE — Assessment & Plan Note (Signed)
Bothered by some of her symptoms but not a big problem

## 2011-06-15 LAB — TSH: TSH: 0.19 u[IU]/mL — ABNORMAL LOW (ref 0.35–5.50)

## 2011-06-18 ENCOUNTER — Other Ambulatory Visit: Payer: Self-pay | Admitting: *Deleted

## 2011-06-18 MED ORDER — FLUDROCORTISONE ACETATE 0.1 MG PO TABS: 0.1000 mg | ORAL_TABLET | Freq: Two times a day (BID) | ORAL | Status: DC

## 2011-06-18 NOTE — Telephone Encounter (Signed)
RX sent to pharmacy and message left for daughter to return call

## 2011-06-18 NOTE — Telephone Encounter (Signed)
Okay to change the fludrocortisone to bid Should help within a week if it is going to help New Rx #60 x 12

## 2011-06-18 NOTE — Telephone Encounter (Signed)
Pt's daughter states pt continues to have nausea.  Says the salt tablets arent working and would like to stop these.  Also asks if ok to take fludrocortisone twice a day. How long should it take before pt sees an improvement with twice a day dosing?

## 2011-06-29 ENCOUNTER — Ambulatory Visit: Payer: Medicare Other | Admitting: Internal Medicine

## 2011-08-13 ENCOUNTER — Telehealth: Payer: Self-pay | Admitting: Oncology

## 2011-09-17 ENCOUNTER — Ambulatory Visit: Payer: Medicare Other | Admitting: Internal Medicine

## 2011-10-11 ENCOUNTER — Ambulatory Visit: Payer: Medicare Other | Admitting: Oncology

## 2011-10-11 ENCOUNTER — Other Ambulatory Visit: Payer: Medicare Other | Admitting: Lab

## 2011-10-12 NOTE — Progress Notes (Signed)
No show

## 2011-11-30 ENCOUNTER — Other Ambulatory Visit: Payer: Self-pay | Admitting: *Deleted

## 2011-11-30 NOTE — Telephone Encounter (Signed)
Faxed refill request   

## 2011-12-02 MED ORDER — DIAZEPAM 2 MG PO TABS: 2.0000 mg | ORAL_TABLET | Freq: Two times a day (BID) | ORAL | Status: DC | PRN

## 2011-12-02 NOTE — Telephone Encounter (Signed)
I am not listed as PCP and haven't seen pt since 2011.  Would defer to Dr Alphonsus Sias as he has seen patient in interval.

## 2011-12-02 NOTE — Telephone Encounter (Signed)
Please decrease this to 2mg   1 bid prn for nerves This med can build up and be dangerous at her age She should use it sparingly  #60 x 0

## 2011-12-02 NOTE — Telephone Encounter (Signed)
rx called into pharmacy Spoke with patient and advised results, I really don't think she understood and asked if there was someone else there I could speak to and there wasn't, I advised if she has questions when she picks it up to give [place] a call

## 2011-12-06 ENCOUNTER — Encounter: Payer: Self-pay | Admitting: Internal Medicine

## 2011-12-06 ENCOUNTER — Ambulatory Visit (INDEPENDENT_AMBULATORY_CARE_PROVIDER_SITE_OTHER): Payer: Medicare Other | Admitting: Internal Medicine

## 2011-12-06 VITALS — BP 150/90 | HR 72 | Temp 98.0°F | Wt 158.0 lb

## 2011-12-06 DIAGNOSIS — R55 Syncope and collapse: Secondary | ICD-10-CM

## 2011-12-06 DIAGNOSIS — I951 Orthostatic hypotension: Secondary | ICD-10-CM

## 2011-12-06 NOTE — Progress Notes (Signed)
Subjective:    Patient ID: [name], female    DOB: 16-Jun-1928, 78 y.o.   MRN: [id number]  HPI Here with daughter  Had been with friend at [employer] this afternoon Jumped quickly out of car----"I know I should take my time but I just go quickly" Went into store and passed out Lost conciousness for a few seconds Disoriented at [employer] acclimated EMS came Systolic BP 150 Refused ER eval  Had dizzy spell earlier in day---similar circumstances---but didn't pass  No chest pain No SOB Some ankle edema---she hadn't really realized  Generally taking her meds Daughter got a pill case and she has only missed occ Did well for a while--now back to AM nausea again  Current Outpatient Prescriptions on File Prior to Visit  Medication Sig Dispense Refill  . aspirin 81 MG EC tablet Take 81 mg by mouth daily.        . diazepam (VALIUM) 2 MG tablet Take 1 tablet (2 mg total) by mouth 2 (two) times daily as needed.  60 tablet  0  . fludrocortisone (FLORINEF) 0.1 MG tablet Take 1 tablet (0.1 mg total) by mouth 2 (two) times daily.  60 tablet  12  . ondansetron (ZOFRAN) 4 MG tablet Take 4 mg by mouth. 30 minutes prior to fludrocortisone as needed for nausea      . polyethylene glycol (MIRALAX / GLYCOLAX) packet Take 17 g by mouth daily.        . sodium chloride 1 G tablet Take 1 g by mouth 2 (two) times daily.          Allergies  Allergen Reactions  . Erythromycin     REACTION: GI upset  . Sulfonamide Derivatives     REACTION: GI upset    Past Medical History  Diagnosis Date  . Colon cancer     h/o colon CA 2010- colectomy 2010  (Dr. Gaylyn Rong with oncology)  . Anxiety     H/o anxiety, would use as needed diazepam with relief  . Thyroid disease     H/o thyroid nodule with benign bx 2010  . Orthostasis     h/o orthostasis, didn't tolerate midodrine, prev eval by Dr. Anne Fu    Past Surgical History  Procedure Date  . US echocardiography [id number]    normal  . Abdominal  hysterectomy   . Partial colectomy 2010    for colon CA    No family history on file.  History   Social History  . Marital Status: Widowed    Spouse Name: N/A    Number of Children: 1  . Years of Education: N/A   Occupational History  . retired    Social History Main Topics  . Smoking status: Never Smoker   . Smokeless tobacco: Never Used  . Alcohol Use: Yes     rare  . Drug Use: No  . Sexually Active: Not on file   Other Topics Concern  . Not on file   Social History Narrative   Widow x2, lives alone. Drives, independent. 1 adopted daughter, leaves near patient. 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 No distress.  Eyes: EOM are normal. Pupils are equal, round, and reactive to light.  Neck: Normal range of motion. Neck supple. No thyromegaly present.  Cardiovascular: Normal rate, regular rhythm and normal  heart sounds.  Exam reveals no gallop.   No murmur heard. Pulmonary/Chest: Effort normal and breath sounds normal. No respiratory distress. She has no wheezes. She has no rales.  Musculoskeletal: She exhibits edema.       Slight ankle edema  Lymphadenopathy:    She has no cervical adenopathy.  Neurological: She is alert. No cranial nerve deficit. Coordination and gait normal.  Psychiatric: She has a normal mood and affect. Her behavior is normal. Thought content normal.          Assessment & Plan:

## 2011-12-06 NOTE — Assessment & Plan Note (Signed)
Did well for a while with the fludrocortisone Now with AM nausea again Has been compliant  Will consider increase to tid if AM symptoms persist

## 2011-12-06 NOTE — Assessment & Plan Note (Signed)
Clearly seems to be orthostatic due to her postural hypotension Discussed not having salt restriction Must take her time getting up (has to try not to be in a rush) Gave Rx for knee high 20-43mm support hose  EKG shows sinus  ??voltage criteria for LVH (not clear cut) Non specific ST T changes----generally reassuring in view of history against ischemia

## 2011-12-24 ENCOUNTER — Telehealth: Payer: Self-pay

## 2011-12-24 DIAGNOSIS — H9190 Unspecified hearing loss, unspecified ear: Secondary | ICD-10-CM | POA: Insufficient documentation

## 2011-12-24 NOTE — Telephone Encounter (Signed)
[name] request referral to [facility] [place]. Needs referral due to medicare guideline faxed to [facility] [place] fax # [phone]. [name] can be reached [phone].

## 2011-12-28 NOTE — Telephone Encounter (Signed)
Referral form faxed to [facility], daughter notified of referral.

## 2012-01-27 ENCOUNTER — Encounter: Payer: Self-pay | Admitting: Internal Medicine

## 2012-02-01 ENCOUNTER — Telehealth: Payer: Self-pay | Admitting: *Deleted

## 2012-02-01 NOTE — Telephone Encounter (Signed)
Patient called requesting to schedule her bi-annual colonoscopy as instructed per hospital discharge two years ago.  Reviewed notes and noted Dr. Evette Cristal (GI) mentioned in September 2012 office note.  Gave patient this MD's office number with instructions to call to schedule colonoscopy.  She asked if this office would send results to our office.  I told her thy will.  Advised her to have her daughter on this call to make sure she can get her to the appt. To keep the appt.  Informed her of not showing up for last appts. @ [facility].

## 2012-02-03 ENCOUNTER — Ambulatory Visit: Payer: Medicare Other | Admitting: Internal Medicine

## 2012-02-07 ENCOUNTER — Ambulatory Visit (INDEPENDENT_AMBULATORY_CARE_PROVIDER_SITE_OTHER): Payer: Medicare Other | Admitting: Internal Medicine

## 2012-02-07 ENCOUNTER — Encounter: Payer: Self-pay | Admitting: Internal Medicine

## 2012-02-07 VITALS — BP 100/72 | HR 82 | Temp 98.8°F | Ht 64.0 in | Wt 149.0 lb

## 2012-02-07 DIAGNOSIS — R5383 Other fatigue: Secondary | ICD-10-CM

## 2012-02-07 DIAGNOSIS — I951 Orthostatic hypotension: Secondary | ICD-10-CM

## 2012-02-07 DIAGNOSIS — R5381 Other malaise: Secondary | ICD-10-CM

## 2012-02-07 LAB — CBC WITH DIFFERENTIAL/PLATELET
Basophils Relative: 0.4 % (ref 0.0–3.0)
Eosinophils Absolute: 0.1 10*3/uL (ref 0.0–0.7)
HCT: 38.8 % (ref 36.0–46.0)
Hemoglobin: 12.5 g/dL (ref 12.0–15.0)
Lymphs Abs: 2.1 10*3/uL (ref 0.7–4.0)
MCHC: 32.3 g/dL (ref 30.0–36.0)
MCV: 87.2 fl (ref 78.0–100.0)
Monocytes Absolute: 0.5 10*3/uL (ref 0.1–1.0)
Neutro Abs: 5 10*3/uL (ref 1.4–7.7)
RBC: 4.44 Mil/uL (ref 3.87–5.11)

## 2012-02-07 LAB — BASIC METABOLIC PANEL
CO2: 33 mEq/L — ABNORMAL HIGH (ref 19–32)
Chloride: 99 mEq/L (ref 96–112)
Creatinine, Ser: 1 mg/dL (ref 0.4–1.2)
Potassium: 3.1 mEq/L — ABNORMAL LOW (ref 3.5–5.1)

## 2012-02-07 LAB — HEPATIC FUNCTION PANEL
ALT: 11 U/L (ref 0–35)
Albumin: 3.7 g/dL (ref 3.5–5.2)
Total Protein: 6.5 g/dL (ref 6.0–8.3)

## 2012-02-07 MED ORDER — FLUDROCORTISONE ACETATE 0.1 MG PO TABS: ORAL_TABLET | ORAL | Status: DC

## 2012-02-07 MED ORDER — SODIUM CHLORIDE 1 G PO TABS: 1.0000 g | ORAL_TABLET | Freq: Two times a day (BID) | ORAL | Status: DC

## 2012-02-07 NOTE — Assessment & Plan Note (Signed)
Seems to be related to the orthostasis Weight is down 9#! Will check labs

## 2012-02-07 NOTE — Assessment & Plan Note (Signed)
Worse Will increase fludrocortisone to 1AM, 2 PM----since AM symptoms are worse Add salt tablet

## 2012-02-07 NOTE — Progress Notes (Signed)
Subjective:    Patient ID: [name], female    DOB: August 31, 1927, 78 y.o.   MRN: [id number]  HPI Here with friend "I'm having a time" Awakens nauseated and dizzy No syncope since last visit Has to sit down after walking a short distance---gets weak feeling like she is ready to pass out No chest pain No SOB  Brought in empty bottle of fludrocortisone--but she states she is taking regularly Takes it 6AM -then lies back down Still dizzy and weak later in AM though  Tries to eat but nausea and doesn't always eat well Doesn't feel depressed but friend thinks she may be Enjoys eating out and shpping with friend  Not on salt restriction  Current Outpatient Prescriptions on File Prior to Visit  Medication Sig Dispense Refill  . aspirin 81 MG EC tablet Take 81 mg by mouth daily.        . diazepam (VALIUM) 2 MG tablet Take 1 tablet (2 mg total) by mouth 2 (two) times daily as needed.  60 tablet  0  . fludrocortisone (FLORINEF) 0.1 MG tablet Take 1 tablet (0.1 mg total) by mouth 2 (two) times daily.  60 tablet  12  . ondansetron (ZOFRAN) 4 MG tablet Take 4 mg by mouth. 30 minutes prior to fludrocortisone as needed for nausea      . polyethylene glycol (MIRALAX / GLYCOLAX) packet Take 17 g by mouth daily.        . sodium chloride 1 G tablet Take 1 g by mouth 2 (two) times daily.          Allergies  Allergen Reactions  . Erythromycin     REACTION: GI upset  . Sulfonamide Derivatives     REACTION: GI upset    Past Medical History  Diagnosis Date  . Colon cancer     h/o colon CA 2010- colectomy 2010  (Dr. Gaylyn Rong with oncology)  . Anxiety     H/o anxiety, would use as needed diazepam with relief  . Thyroid disease     H/o thyroid nodule with benign bx 2010  . Orthostasis     h/o orthostasis, didn't tolerate midodrine, prev eval by Dr. Anne Fu    Past Surgical History  Procedure Date  . US echocardiography [id number]    normal  . Abdominal hysterectomy   . Partial colectomy  2010    for colon CA    No family history on file.  History   Social History  . Marital Status: Widowed    Spouse Name: N/A    Number of Children: 1  . Years of Education: N/A   Occupational History  . retired    Social History Main Topics  . Smoking status: Never Smoker   . Smokeless tobacco: Never Used  . Alcohol Use: Yes     rare  . Drug Use: No  . Sexually Active: Not on file   Other Topics Concern  . Not on file   Social History Narrative   Widow x2, lives alone. Drives, independent. 1 adopted daughter, leaves near patient. 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 No distress.  Neck: Normal range of motion. Neck supple. No thyromegaly present.  Cardiovascular: Normal rate, regular rhythm, normal heart sounds and intact distal pulses.  Exam reveals no gallop.  No murmur heard. Pulmonary/Chest: Effort normal and breath sounds normal. No respiratory distress. She has no wheezes. She has no rales.  Abdominal: Soft. There is no tenderness.  Musculoskeletal: She exhibits no edema and no tenderness.  Lymphadenopathy:    She has no cervical adenopathy.  Psychiatric: She has a normal mood and affect. Her behavior is normal.          Assessment & Plan:

## 2012-02-07 NOTE — Patient Instructions (Signed)
Please call Dr Luan Moore office to set up colonoscopy----please get number for her

## 2012-02-11 ENCOUNTER — Encounter: Payer: Self-pay | Admitting: *Deleted

## 2012-03-06 ENCOUNTER — Encounter: Payer: Self-pay | Admitting: Internal Medicine

## 2012-03-06 ENCOUNTER — Ambulatory Visit (INDEPENDENT_AMBULATORY_CARE_PROVIDER_SITE_OTHER): Payer: Medicare Other | Admitting: Internal Medicine

## 2012-03-06 VITALS — BP 100/60 | HR 74 | Temp 98.2°F | Ht 64.0 in | Wt 150.0 lb

## 2012-03-06 DIAGNOSIS — I951 Orthostatic hypotension: Secondary | ICD-10-CM

## 2012-03-06 NOTE — Assessment & Plan Note (Signed)
Still has persistent symptoms No clear vertigo though I wonder whether some of this is vestibular Will continue current meds and check met b again  Will consider adding meclizine 25 in the morning if symptoms worsen

## 2012-03-06 NOTE — Progress Notes (Signed)
Subjective:    Patient ID: [name], female    DOB: [birth year], 78 y.o.   MRN: [id number]  HPI Here with sister  Did increase fludrocortisone to 2 at bedtime Feels really well some days---bad others No clear reason for the difference Has been taking the salt tablets Generally feels better by about noon Sleeps 10PM till 6AM  No syncope Has to be very careful in hot weather--- will get dizzy  "something overwhelming me, taking me" No true vertigo. Has the AM nausea along with not feeling right Some help by sitting on side of bed and dangling feet before getting up  Current Outpatient Prescriptions on File Prior to Visit  Medication Sig Dispense Refill  . aspirin 81 MG EC tablet Take 81 mg by mouth daily.        . diazepam (VALIUM) 2 MG tablet Take 1 tablet (2 mg total) by mouth 2 (two) times daily as needed.  60 tablet  0  . fludrocortisone (FLORINEF) 0.1 MG tablet 1 tab in AM, 2 tabs in PM  90 tablet  12  . ondansetron (ZOFRAN) 4 MG tablet Take 4 mg by mouth. 30 minutes prior to fludrocortisone as needed for nausea      . polyethylene glycol (MIRALAX / GLYCOLAX) packet Take 17 g by mouth daily.        . sodium chloride 1 G tablet Take 1 tablet (1 g total) by mouth 2 (two) times daily.  60 tablet  5    Allergies  Allergen Reactions  . Erythromycin     REACTION: GI upset  . Sulfonamide Derivatives     REACTION: GI upset    Past Medical History  Diagnosis Date  . Colon cancer     h/o colon CA 2010- colectomy 2010  (Dr. Gaylyn Rong with oncology)  . Anxiety     H/o anxiety, would use as needed diazepam with relief  . Thyroid disease     H/o thyroid nodule with benign bx 2010  . Orthostasis     h/o orthostasis, didn't tolerate midodrine, prev eval by Dr. Anne Fu    Past Surgical History  Procedure Date  . US echocardiography [id number]    normal  . Abdominal hysterectomy   . Partial colectomy 2010    for colon CA    No family history on file.  History   Social  History  . Marital Status: Widowed    Spouse Name: N/A    Number of Children: 1  . Years of Education: N/A   Occupational History  . retired    Social History Main Topics  . Smoking status: Never Smoker   . Smokeless tobacco: Never Used  . Alcohol Use: Yes     rare  . Drug Use: No  . Sexually Active: Not on file   Other Topics Concern  . Not on file   Social History Narrative   Widow x2, lives alone. Drives, independent. 1 adopted daughter, leaves near patient. 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 Enjoys fresh squash, etc Weight is stable    Objective:   Physical Exam  Constitutional: She appears well-developed and well-nourished. No distress.  Neck: Normal range of motion. Neck supple.  Cardiovascular: Normal rate, regular rhythm and normal heart sounds.  Exam reveals no gallop.   No murmur heard. Pulmonary/Chest: Effort normal and breath sounds normal. No respiratory distress. She has no wheezes. She has  no rales.  Musculoskeletal: She exhibits no edema and no tenderness.  Lymphadenopathy:    She has no cervical adenopathy.  Neurological:       Normal gait Romberg absent No nystagmus  Psychiatric: She has a normal mood and affect. Her behavior is normal.          Assessment & Plan:

## 2012-03-06 NOTE — Patient Instructions (Signed)
Please consider taking meclizine 25mg  in the morning if you dizzy feelings get worse

## 2012-03-07 LAB — BASIC METABOLIC PANEL
Chloride: 100 mEq/L (ref 96–112)
GFR: 58.4 mL/min — ABNORMAL LOW (ref >60.00)
Potassium: 4.1 mEq/L (ref 3.5–5.1)

## 2012-03-09 ENCOUNTER — Encounter: Payer: Self-pay | Admitting: *Deleted

## 2012-03-16 ENCOUNTER — Telehealth: Payer: Self-pay

## 2012-03-16 NOTE — Telephone Encounter (Signed)
Those are over the counter I do send Rx for meclizine 25 tid prn  You can send the chewable and see if insurance pays #60 x 0

## 2012-03-16 NOTE — Telephone Encounter (Signed)
Pt left v/m requesting Bonine chewable tab for motion sickness to [facility].Please advise.

## 2012-03-17 MED ORDER — MECLIZINE HCL 25 MG PO CHEW: 1.0000 | CHEWABLE_TABLET | Freq: Three times a day (TID) | ORAL | Status: DC | PRN

## 2012-03-17 NOTE — Telephone Encounter (Signed)
Spoke with patient and advised results rx sent to pharmacy by e-script  

## 2012-04-17 ENCOUNTER — Other Ambulatory Visit: Payer: Self-pay | Admitting: *Deleted

## 2012-04-17 MED ORDER — MECLIZINE HCL 25 MG PO CHEW: 1.0000 | CHEWABLE_TABLET | Freq: Three times a day (TID) | ORAL | Status: DC | PRN

## 2012-05-11 ENCOUNTER — Other Ambulatory Visit: Payer: Self-pay | Admitting: Internal Medicine

## 2012-06-12 ENCOUNTER — Encounter (INDEPENDENT_AMBULATORY_CARE_PROVIDER_SITE_OTHER): Payer: Medicare Other | Admitting: Ophthalmology

## 2012-06-19 ENCOUNTER — Encounter (INDEPENDENT_AMBULATORY_CARE_PROVIDER_SITE_OTHER): Payer: Medicare Other | Admitting: Ophthalmology

## 2012-06-19 ENCOUNTER — Other Ambulatory Visit: Payer: Self-pay | Admitting: Gastroenterology

## 2012-06-21 ENCOUNTER — Encounter (INDEPENDENT_AMBULATORY_CARE_PROVIDER_SITE_OTHER): Payer: Medicare Other | Admitting: Ophthalmology

## 2012-06-21 DIAGNOSIS — H3530 Unspecified macular degeneration: Secondary | ICD-10-CM

## 2012-06-21 DIAGNOSIS — H353290 Exudative age-related macular degeneration, unspecified eye, stage unspecified: Secondary | ICD-10-CM

## 2012-06-21 DIAGNOSIS — H43819 Vitreous degeneration, unspecified eye: Secondary | ICD-10-CM

## 2012-06-23 ENCOUNTER — Encounter: Payer: Self-pay | Admitting: Internal Medicine

## 2012-06-23 ENCOUNTER — Ambulatory Visit (INDEPENDENT_AMBULATORY_CARE_PROVIDER_SITE_OTHER): Payer: Medicare Other | Admitting: Internal Medicine

## 2012-06-23 VITALS — BP 120/70 | HR 63 | Temp 98.5°F | Wt 147.0 lb

## 2012-06-23 DIAGNOSIS — G309 Alzheimer's disease, unspecified: Secondary | ICD-10-CM

## 2012-06-23 DIAGNOSIS — R413 Other amnesia: Secondary | ICD-10-CM

## 2012-06-23 DIAGNOSIS — F0280 Dementia in other diseases classified elsewhere without behavioral disturbance: Secondary | ICD-10-CM | POA: Insufficient documentation

## 2012-06-23 LAB — HEPATIC FUNCTION PANEL
AST: 11 U/L (ref 0–37)
Alkaline Phosphatase: 59 U/L (ref 39–117)
Bilirubin, Direct: 0.1 mg/dL (ref 0.0–0.3)
Total Bilirubin: 0.4 mg/dL (ref 0.3–1.2)

## 2012-06-23 LAB — CBC WITH DIFFERENTIAL/PLATELET
Basophils Absolute: 0 10*3/uL (ref 0.0–0.1)
Basophils Relative: 0 % (ref 0–1)
Eosinophils Absolute: 0.2 10*3/uL (ref 0.0–0.7)
HCT: 36.1 % (ref 36.0–46.0)
Hemoglobin: 11.7 g/dL — ABNORMAL LOW (ref 12.0–15.0)
MCH: 27.4 pg (ref 26.0–34.0)
MCHC: 32.4 g/dL (ref 30.0–36.0)
Monocytes Absolute: 0.6 10*3/uL (ref 0.1–1.0)
Monocytes Relative: 7 % (ref 3–12)
Neutro Abs: 4.6 10*3/uL (ref 1.7–7.7)
RDW: 14.9 % (ref 11.5–15.5)

## 2012-06-23 LAB — BASIC METABOLIC PANEL
CO2: 39 mEq/L — ABNORMAL HIGH (ref 19–32)
Calcium: 9 mg/dL (ref 8.4–10.5)
Chloride: 100 mEq/L (ref 96–112)
Creat: 1.01 mg/dL (ref 0.50–1.10)
Glucose, Bld: 81 mg/dL (ref 70–99)
Sodium: 145 mEq/L (ref 135–145)

## 2012-06-23 LAB — VITAMIN B12: Vitamin B-12: 288 pg/mL (ref 211–911)

## 2012-06-23 NOTE — Addendum Note (Signed)
Addended by: Alvina Chou on: 06/23/2012 05:16 PM   Modules accepted: Orders

## 2012-06-23 NOTE — Progress Notes (Signed)
Subjective:    Patient ID: [name], female    DOB: 07-21-1928, 78 y.o.   MRN: [id number]  HPI Daughter is here but not patient  She is really worried about her Increased confusion and forgetfulness Disoriented, can't find things  Had colonoscopy on Monday Advised to stay in but 30 minutes later drove down the road to daughter's house Apparently had hallucination that day---said "when did you get a dog" when pointing to pile of tennis shoes  Recent eye exam---bad macular degeneration in left eye Right eye is okay fortunately  May have had other hallucinations since Monday (when not medicated)  Daughter spoke to several close friends They have all noticed the confusion and memory issues Patient told friends not to tell daughter Daughter now has taken over finances,etc  Current Outpatient Prescriptions on File Prior to Visit  Medication Sig Dispense Refill  . ANTIVERT 25 MG tablet TAKE 1 TABLET 3 TIMES A DAY AS NEEDED.  60 each  11  . aspirin 81 MG EC tablet Take 81 mg by mouth daily.        . diazepam (VALIUM) 2 MG tablet Take 1 tablet (2 mg total) by mouth 2 (two) times daily as needed.  60 tablet  0  . fludrocortisone (FLORINEF) 0.1 MG tablet 1 tab in AM, 2 tabs in PM  90 tablet  12  . Meclizine HCl (BONINE) 25 MG CHEW Chew 1 tablet (25 mg total) by mouth 3 (three) times daily as needed.  60 tablet  0  . polyethylene glycol (MIRALAX / GLYCOLAX) packet Take 17 g by mouth daily.        . sodium chloride 1 G tablet Take 1 tablet (1 g total) by mouth 2 (two) times daily.  60 tablet  5    Allergies  Allergen Reactions  . Erythromycin     REACTION: GI upset  . Sulfonamide Derivatives     REACTION: GI upset    Past Medical History  Diagnosis Date  . Colon cancer     h/o colon CA 2010- colectomy 2010  (Dr. Gaylyn Rong with oncology)  . Anxiety     H/o anxiety, would use as needed diazepam with relief  . Thyroid disease     H/o thyroid nodule with benign bx 2010  .  Orthostasis     h/o orthostasis, didn't tolerate midodrine, prev eval by Dr. Anne Fu    Past Surgical History  Procedure Date  . US echocardiography [id number]    normal  . Abdominal hysterectomy   . Partial colectomy 2010    for colon CA    No family history on file.  History   Social History  . Marital Status: Widowed    Spouse Name: N/A    Number of Children: 1  . Years of Education: N/A   Occupational History  . retired    Social History Main Topics  . Smoking status: Never Smoker   . Smokeless tobacco: Never Used  . Alcohol Use: Yes     rare  . Drug Use: No  . Sexually Active: Not on file   Other Topics Concern  . Not on file   Social History Narrative   Widow x2, lives alone. Drives, independent. 1 adopted daughter, leaves near patient. 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

## 2012-06-23 NOTE — Progress Notes (Signed)
  Subjective:    Patient ID: [name], female    DOB: [birth year], 78 y.o.   MRN: [id number]  HPI See earlier note from daughter's visit She doesn't think her memory is worse---does note recall issues though Vision problems concern her  Macular degeneration ---daughter noted problems even with good eye Hearing is worse also---daughter will be getting   No depression Does get upset easily---like if she loses her keys Sleeping well  Did awaken with sensory experience last night---"trying to push up the door" Went to neighbor to get help (after the eye injection)  Current Outpatient Prescriptions on File Prior to Visit  Medication Sig Dispense Refill  . aspirin 81 MG EC tablet Take 81 mg by mouth daily.        . diazepam (VALIUM) 2 MG tablet Take 1 tablet (2 mg total) by mouth 2 (two) times daily as needed.  60 tablet  0  . fludrocortisone (FLORINEF) 0.1 MG tablet 1 tab in AM, 2 tabs in PM  90 tablet  12  . Meclizine HCl (BONINE) 25 MG CHEW Chew 1 tablet (25 mg total) by mouth 3 (three) times daily as needed.  60 tablet  0    Allergies  Allergen Reactions  . Erythromycin     REACTION: GI upset  . Sulfonamide Derivatives     REACTION: GI upset    Past Medical History  Diagnosis Date  . Colon cancer     h/o colon CA 2010- colectomy 2010  (Dr. Gaylyn Rong with oncology)  . Anxiety     H/o anxiety, would use as needed diazepam with relief  . Thyroid disease     H/o thyroid nodule with benign bx 2010  . Orthostasis     h/o orthostasis, didn't tolerate midodrine, prev eval by Dr. Anne Fu    Past Surgical History  Procedure Date  . US echocardiography [id number]    normal  . Abdominal hysterectomy   . Partial colectomy 2010    for colon CA    No family history on file.  History   Social History  . Marital Status: Widowed    Spouse Name: N/A    Number of Children: 1  . Years of Education: N/A   Occupational History  . retired    Social History Main Topics  . Smoking  status: Never Smoker   . Smokeless tobacco: Never Used  . Alcohol Use: Yes     rare  . Drug Use: No  . Sexually Active: Not on file   Other Topics Concern  . Not on file   Social History Narrative   Widow x2, lives alone. Drives, independent. 1 adopted daughter, leaves near patient. 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 No falls since she is careful No incontinence--does have frequency and lots of nocturia    Objective:   Physical Exam  Constitutional: She is oriented to person, place, and time.  Neurological: She is alert and oriented to person, place, and time.       Couldn't remember my name but otherwise oriented President -"Obama, ?" 100- "7 from 10 is 3, so 103" D-l-o-w Recall 2/3 Can tell the time on my watch          Assessment & Plan:

## 2012-06-23 NOTE — Assessment & Plan Note (Signed)
Clearly worse and seems to have crossed over into the realm of Alzheimer's The hallucinations may be from sensory deprivation with both vision and hearing problems but could be true psychotic symptoms from the dementia Will recheck labs Stop diazepam and meclizine MRI of brain  See back in 2 weeks

## 2012-06-23 NOTE — Assessment & Plan Note (Signed)
Unable to discuss in full due to lack of advanced beneficiary notice Daughter will try to bring her in  Sounds like she has now gone into early Alzheimers

## 2012-06-26 ENCOUNTER — Ambulatory Visit (INDEPENDENT_AMBULATORY_CARE_PROVIDER_SITE_OTHER): Payer: Medicare Other | Admitting: Ophthalmology

## 2012-06-26 DIAGNOSIS — H43819 Vitreous degeneration, unspecified eye: Secondary | ICD-10-CM

## 2012-06-26 DIAGNOSIS — H3530 Unspecified macular degeneration: Secondary | ICD-10-CM

## 2012-06-26 DIAGNOSIS — H353290 Exudative age-related macular degeneration, unspecified eye, stage unspecified: Secondary | ICD-10-CM

## 2012-06-28 ENCOUNTER — Other Ambulatory Visit: Payer: Medicare Other

## 2012-06-28 ENCOUNTER — Telehealth: Payer: Self-pay | Admitting: *Deleted

## 2012-06-28 MED ORDER — POTASSIUM CHLORIDE 20 MEQ PO PACK: 20.0000 meq | PACK | Freq: Two times a day (BID) | ORAL | Status: DC

## 2012-06-28 NOTE — Telephone Encounter (Signed)
Message copied by Sueanne Margarita on Wed Jun 28, 2012 10:29 AM ------      Message from: Tillman Abide I      Created: Sat Jun 24, 2012  4:06 PM       Please call      Blood work is all fine except for a low potassium level.      This may be related to the fludrocortisone      Please send Rx for KCl      1 daily       #30 x 5            Everything else was fine      We will recheck the potassium at her 2 week follow up appt

## 2012-06-28 NOTE — Telephone Encounter (Signed)
rx sent to pharmacy by e-script Spoke with daughter and advised results.  

## 2012-07-01 ENCOUNTER — Ambulatory Visit
Admission: RE | Admit: 2012-07-01 | Discharge: 2012-07-01 | Disposition: A | Discharge status: Home / Self Care | Payer: Medicare Other | Source: Ambulatory Visit | Attending: Internal Medicine | Admitting: Internal Medicine

## 2012-07-01 DIAGNOSIS — R413 Other amnesia: Secondary | ICD-10-CM

## 2012-07-01 MED ORDER — GADOBENATE DIMEGLUMINE 529 MG/ML IV SOLN
13.0000 mL | Freq: Once | INTRAVENOUS | Status: AC | PRN
  Administered 2012-07-01: 13 mL via INTRAVENOUS

## 2012-07-10 ENCOUNTER — Encounter: Payer: Self-pay | Admitting: Internal Medicine

## 2012-07-10 ENCOUNTER — Ambulatory Visit (INDEPENDENT_AMBULATORY_CARE_PROVIDER_SITE_OTHER): Payer: Medicare Other | Admitting: Internal Medicine

## 2012-07-10 VITALS — BP 110/60 | HR 75 | Temp 98.0°F | Wt 150.0 lb

## 2012-07-10 DIAGNOSIS — F0280 Dementia in other diseases classified elsewhere without behavioral disturbance: Secondary | ICD-10-CM

## 2012-07-10 DIAGNOSIS — G309 Alzheimer's disease, unspecified: Secondary | ICD-10-CM

## 2012-07-10 NOTE — Progress Notes (Signed)
  Subjective:    Patient ID: [name], female    DOB: Jun 09, 1928, 78 y.o.   MRN: [id number]  HPI Here with daughter Lisabeth Devoid she is better--- "you seem to have hit the spot with those medications" Daughter notes that the nausea is better--has been more compliant with the meds (she has done a better job with the backround so she can see the white pill" Now on the potassium  Daughter took her car away She is upset about that---discussed formal driver evaluation  Feels better in the day Daughter still notices the confusion Seems to have more problems in the evening--displaces things, wanders some, has mistaken 76 year old granddaughter with 53 year old Does have fluorescent lights   Daughter now pays bills and brings her to grocery store Not much cooking--has housekeeper  Current Outpatient Prescriptions on File Prior to Visit  Medication Sig Dispense Refill  . aspirin 81 MG EC tablet Take 81 mg by mouth daily.        . fludrocortisone (FLORINEF) 0.1 MG tablet 1 tab in AM, 2 tabs in PM  90 tablet  12  . potassium chloride SA (K-DUR,KLOR-CON) 20 MEQ tablet Take 20 mEq by mouth daily.         Allergies  Allergen Reactions  . Erythromycin     REACTION: GI upset  . Sulfonamide Derivatives     REACTION: GI upset    Past Medical History  Diagnosis Date  . Colon cancer     h/o colon CA 2010- colectomy 2010  (Dr. Gaylyn Rong with oncology)  . Anxiety     H/o anxiety, would use as needed diazepam with relief  . Thyroid disease     H/o thyroid nodule with benign bx 2010  . Orthostasis     h/o orthostasis, didn't tolerate midodrine, prev eval by Dr. Anne Fu    Past Surgical History  Procedure Date  . US echocardiography [id number]    normal  . Abdominal hysterectomy   . Partial colectomy 2010    for colon CA    No family history on file.  History   Social History  . Marital Status: Widowed    Spouse Name: N/A    Number of Children: 1  . Years of Education: N/A    Occupational History  . retired    Social History Main Topics  . Smoking status: Never Smoker   . Smokeless tobacco: Never Used  . Alcohol Use: Yes     Comment: rare  . Drug Use: No  . Sexually Active: Not on file   Other Topics Concern  . Not on file   Social History Narrative   Widow x2, lives alone. Drives, independent. 1 adopted daughter, leaves near patient. 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

## 2012-07-10 NOTE — Assessment & Plan Note (Signed)
Mild Seems to be better functionally since on the flurinef (no nausea) Still mild and does well till evening She is resistant to any meds Will just recheck in 3 months---if sig worsening, trial of donepezil

## 2012-07-11 LAB — BASIC METABOLIC PANEL
BUN: 24 mg/dL — ABNORMAL HIGH (ref 6–23)
CO2: 34 mEq/L — ABNORMAL HIGH (ref 19–32)
Chloride: 101 mEq/L (ref 96–112)
Creatinine, Ser: 1 mg/dL (ref 0.4–1.2)
Glucose, Bld: 89 mg/dL (ref 70–99)
Potassium: 3.5 mEq/L (ref 3.5–5.1)

## 2012-07-21 ENCOUNTER — Encounter (INDEPENDENT_AMBULATORY_CARE_PROVIDER_SITE_OTHER): Payer: Medicare Other | Admitting: Ophthalmology

## 2012-07-21 DIAGNOSIS — H43819 Vitreous degeneration, unspecified eye: Secondary | ICD-10-CM

## 2012-07-21 DIAGNOSIS — H353290 Exudative age-related macular degeneration, unspecified eye, stage unspecified: Secondary | ICD-10-CM

## 2012-07-21 DIAGNOSIS — H3530 Unspecified macular degeneration: Secondary | ICD-10-CM

## 2012-08-18 ENCOUNTER — Encounter (INDEPENDENT_AMBULATORY_CARE_PROVIDER_SITE_OTHER): Payer: Medicare Other | Admitting: Ophthalmology

## 2012-08-18 DIAGNOSIS — H43819 Vitreous degeneration, unspecified eye: Secondary | ICD-10-CM

## 2012-08-18 DIAGNOSIS — H353290 Exudative age-related macular degeneration, unspecified eye, stage unspecified: Secondary | ICD-10-CM

## 2012-08-18 DIAGNOSIS — H3530 Unspecified macular degeneration: Secondary | ICD-10-CM

## 2012-09-08 ENCOUNTER — Encounter (INDEPENDENT_AMBULATORY_CARE_PROVIDER_SITE_OTHER): Payer: Medicare Other | Admitting: Ophthalmology

## 2012-09-08 DIAGNOSIS — H353290 Exudative age-related macular degeneration, unspecified eye, stage unspecified: Secondary | ICD-10-CM

## 2012-09-08 DIAGNOSIS — H3530 Unspecified macular degeneration: Secondary | ICD-10-CM

## 2012-09-08 DIAGNOSIS — H43819 Vitreous degeneration, unspecified eye: Secondary | ICD-10-CM

## 2012-09-28 ENCOUNTER — Telehealth: Payer: Self-pay

## 2012-09-28 NOTE — Telephone Encounter (Signed)
Pt wanted to verify appt date and time with Dr Alphonsus Sias; advised pt 10/02/12 Mon at 3:45 pm. Pt voiced understanding.

## 2012-10-02 ENCOUNTER — Ambulatory Visit (INDEPENDENT_AMBULATORY_CARE_PROVIDER_SITE_OTHER): Payer: Medicare Other | Admitting: Internal Medicine

## 2012-10-02 ENCOUNTER — Encounter (INDEPENDENT_AMBULATORY_CARE_PROVIDER_SITE_OTHER): Payer: Medicare Other | Admitting: Ophthalmology

## 2012-10-02 ENCOUNTER — Encounter: Payer: Self-pay | Admitting: Internal Medicine

## 2012-10-02 VITALS — BP 152/80 | HR 68 | Temp 98.0°F | Wt 152.0 lb

## 2012-10-02 DIAGNOSIS — H353290 Exudative age-related macular degeneration, unspecified eye, stage unspecified: Secondary | ICD-10-CM

## 2012-10-02 DIAGNOSIS — H43819 Vitreous degeneration, unspecified eye: Secondary | ICD-10-CM

## 2012-10-02 DIAGNOSIS — H3530 Unspecified macular degeneration: Secondary | ICD-10-CM

## 2012-10-02 DIAGNOSIS — F0280 Dementia in other diseases classified elsewhere without behavioral disturbance: Secondary | ICD-10-CM

## 2012-10-02 DIAGNOSIS — H26499 Other secondary cataract, unspecified eye: Secondary | ICD-10-CM

## 2012-10-02 DIAGNOSIS — I951 Orthostatic hypotension: Secondary | ICD-10-CM

## 2012-10-02 DIAGNOSIS — G309 Alzheimer's disease, unspecified: Secondary | ICD-10-CM

## 2012-10-02 DIAGNOSIS — F411 Generalized anxiety disorder: Secondary | ICD-10-CM

## 2012-10-02 NOTE — Assessment & Plan Note (Signed)
Still mild with no additional functional decline Doesn't drive and is upset about this--mostly due to visual loss Not ready for meds and is stable so will just observe May need aide (if daughter has to go out of town for work)

## 2012-10-02 NOTE — Assessment & Plan Note (Signed)
Gets upset with memory issues Seems to be mostly self limited Could consider SSRI

## 2012-10-02 NOTE — Progress Notes (Signed)
  Subjective:    Patient ID: [name], female    DOB: [birth year], 79 y.o.   MRN: [id number]  HPI Here with daughter and grandson  Doing better Not having the AM dizziness Not walking into the walls Not having the nausea Daughter does have a system to make sure she takes her meds  Has some hallucinations Seems to be related to poor vision--pile of shoes looks like an animal  Memory is still not good Not driving at our insistence---not happy about this Some struggles with thermostat but no other apraxia Loses things easily--then gets very upset  Does have chronic anxiety---she denies but daughter notes long term worrying Cries if she loses little things, like pocket book or cell phone No clear depression Not anhedonic  Current Outpatient Prescriptions on File Prior to Visit  Medication Sig Dispense Refill  . aspirin 81 MG EC tablet Take 81 mg by mouth daily.        . fludrocortisone (FLORINEF) 0.1 MG tablet 1 tab in AM, 2 tabs in PM  90 tablet  12    Allergies  Allergen Reactions  . Erythromycin     REACTION: GI upset  . Sulfonamide Derivatives     REACTION: GI upset    Past Medical History  Diagnosis Date  . Colon cancer     h/o colon CA 2010- colectomy 2010  (Dr. Gaylyn Rong with oncology)  . Anxiety     H/o anxiety, would use as needed diazepam with relief  . Thyroid disease     H/o thyroid nodule with benign bx 2010  . Orthostasis     h/o orthostasis, didn't tolerate midodrine, prev eval by Dr. Anne Fu  . Alzheimer's dementia     Past Surgical History  Procedure Date  . US echocardiography [id number]    normal  . Abdominal hysterectomy   . Partial colectomy 2010    for colon CA    No family history on file.   Review of Systems Vision is not great, but no sig change Sleeping well Appetite is good Weight up 2#     Objective:   Physical Exam  Constitutional: She appears well-developed and well-nourished. No distress.  Neck: Normal range of motion.  Neck supple. No thyromegaly present.  Cardiovascular: Normal rate, regular rhythm and normal heart sounds.  Exam reveals no gallop.   No murmur heard. Pulmonary/Chest: Effort normal and breath sounds normal. No respiratory distress. She has no wheezes. She has no rales.  Musculoskeletal: She exhibits no edema and no tenderness.  Lymphadenopathy:    She has no cervical adenopathy.  Psychiatric: She has a normal mood and affect. Her behavior is normal.          Assessment & Plan:

## 2012-10-02 NOTE — Assessment & Plan Note (Signed)
Doing better now with consistent use of the fludrocortisone BP up some---acceptable No edema

## 2012-11-01 ENCOUNTER — Encounter (INDEPENDENT_AMBULATORY_CARE_PROVIDER_SITE_OTHER): Payer: Medicare Other | Admitting: Ophthalmology

## 2012-11-02 ENCOUNTER — Encounter (INDEPENDENT_AMBULATORY_CARE_PROVIDER_SITE_OTHER): Payer: Medicare Other | Admitting: Ophthalmology

## 2012-11-02 DIAGNOSIS — H353290 Exudative age-related macular degeneration, unspecified eye, stage unspecified: Secondary | ICD-10-CM

## 2012-11-02 DIAGNOSIS — H3530 Unspecified macular degeneration: Secondary | ICD-10-CM

## 2012-11-02 DIAGNOSIS — H26499 Other secondary cataract, unspecified eye: Secondary | ICD-10-CM

## 2012-12-07 ENCOUNTER — Encounter (INDEPENDENT_AMBULATORY_CARE_PROVIDER_SITE_OTHER): Payer: Medicare Other | Admitting: Ophthalmology

## 2012-12-07 DIAGNOSIS — H3530 Unspecified macular degeneration: Secondary | ICD-10-CM

## 2012-12-07 DIAGNOSIS — H43819 Vitreous degeneration, unspecified eye: Secondary | ICD-10-CM

## 2012-12-07 DIAGNOSIS — H353290 Exudative age-related macular degeneration, unspecified eye, stage unspecified: Secondary | ICD-10-CM

## 2013-01-12 ENCOUNTER — Ambulatory Visit (INDEPENDENT_AMBULATORY_CARE_PROVIDER_SITE_OTHER): Payer: Medicare Other | Admitting: Internal Medicine

## 2013-01-12 ENCOUNTER — Encounter: Payer: Self-pay | Admitting: Internal Medicine

## 2013-01-12 VITALS — BP 100/60 | HR 60 | Temp 97.8°F | Ht 64.0 in | Wt 143.8 lb

## 2013-01-12 DIAGNOSIS — G309 Alzheimer's disease, unspecified: Secondary | ICD-10-CM

## 2013-01-12 DIAGNOSIS — F329 Major depressive disorder, single episode, unspecified: Secondary | ICD-10-CM | POA: Insufficient documentation

## 2013-01-12 DIAGNOSIS — F0280 Dementia in other diseases classified elsewhere without behavioral disturbance: Secondary | ICD-10-CM

## 2013-01-12 DIAGNOSIS — I951 Orthostatic hypotension: Secondary | ICD-10-CM

## 2013-01-12 DIAGNOSIS — F3289 Other specified depressive episodes: Secondary | ICD-10-CM

## 2013-01-12 NOTE — Progress Notes (Signed)
  Subjective:    Patient ID: [name], female    DOB: 02/21/1928, 79 y.o.   MRN: [id number]  HPI Here with daughter  Has felt "rotten" most of this week Worried that her blood pressure had dropped When she gets up in the morning-- will come close to falling soon after (has to hold onto something) Has fallen 4 times in the past few weeks and is shaky (did get a hematoma on side of head with one fall) Feels weak  Limited ability to walk Daughter tracking medications--- seems to be getting 3 a day  Hasn't been drinking a lot Not eating well  Ready to get some help Forgetfulness and disorientation have worsened--but not terrible Mood has been off  Still on the macular degeneration treatment  Current Outpatient Prescriptions on File Prior to Visit  Medication Sig Dispense Refill  . aspirin 81 MG EC tablet Take 81 mg by mouth daily.        . fludrocortisone (FLORINEF) 0.1 MG tablet 1 tab in AM, 2 tabs in PM  90 tablet  12   No current facility-administered medications on file prior to visit.    Allergies  Allergen Reactions  . Erythromycin     REACTION: GI upset  . Sulfonamide Derivatives     REACTION: GI upset    Past Medical History  Diagnosis Date  . Colon cancer     h/o colon CA 2010- colectomy 2010  (Dr. Gaylyn Rong with oncology)  . Anxiety     H/o anxiety, would use as needed diazepam with relief  . Thyroid disease     H/o thyroid nodule with benign bx 2010  . Orthostasis     h/o orthostasis, didn't tolerate midodrine, prev eval by Dr. Anne Fu  . Alzheimer's dementia     Past Surgical History  Procedure Laterality Date  . US echocardiography  [id number]    normal  . Abdominal hysterectomy    . Partial colectomy  2010    for colon CA    No family history on file.  History   Social History  . Marital Status: Widowed    Spouse Name: N/A    Number of Children: 1  . Years of Education: N/A   Occupational History  . retired    Social History Main  Topics  . Smoking status: Never Smoker   . Smokeless tobacco: Never Used  . Alcohol Use: Yes     Comment: rare  . Drug Use: No  . Sexually Active: Not on file   Other Topics Concern  . Not on file   Social History Narrative   Widow x2, lives alone.     1 adopted daughter, leaves near patient.    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 No distress.  Psychiatric:  Seems depressed and flat          Assessment & Plan:

## 2013-01-12 NOTE — Patient Instructions (Signed)
Please take 1-2 quarts of pedialyte daily.

## 2013-01-12 NOTE — Assessment & Plan Note (Signed)
Mild but worse Along with her vision loss, she either needs a daily aide or should live in assisted living Discussed with her daughter

## 2013-01-12 NOTE — Assessment & Plan Note (Addendum)
Worse but may be due to not taking in any salt or water Recommended 1-2 quarts of pedialyte a day (or other salt and fluids) Might have to consider trying midodrine again (didn't seem to help in the past)

## 2013-01-12 NOTE — Assessment & Plan Note (Signed)
Seems to be related to loss of vision and function Probably some loneliness as well Will wait for now--- if daily aides don't perk her up, I would start a SSRI

## 2013-01-18 ENCOUNTER — Encounter: Payer: Self-pay | Admitting: Internal Medicine

## 2013-01-25 ENCOUNTER — Encounter (INDEPENDENT_AMBULATORY_CARE_PROVIDER_SITE_OTHER): Payer: Medicare Other | Admitting: Ophthalmology

## 2013-01-25 DIAGNOSIS — H3530 Unspecified macular degeneration: Secondary | ICD-10-CM

## 2013-01-25 DIAGNOSIS — H353290 Exudative age-related macular degeneration, unspecified eye, stage unspecified: Secondary | ICD-10-CM

## 2013-01-25 DIAGNOSIS — H2700 Aphakia, unspecified eye: Secondary | ICD-10-CM

## 2013-01-25 DIAGNOSIS — H43819 Vitreous degeneration, unspecified eye: Secondary | ICD-10-CM

## 2013-02-02 ENCOUNTER — Ambulatory Visit: Payer: Medicare Other | Admitting: Internal Medicine

## 2013-02-09 ENCOUNTER — Ambulatory Visit (INDEPENDENT_AMBULATORY_CARE_PROVIDER_SITE_OTHER): Payer: Medicare Other | Admitting: Internal Medicine

## 2013-02-09 ENCOUNTER — Encounter: Payer: Self-pay | Admitting: Internal Medicine

## 2013-02-09 VITALS — BP 128/70 | HR 75 | Temp 98.6°F | Wt 139.0 lb

## 2013-02-09 DIAGNOSIS — F329 Major depressive disorder, single episode, unspecified: Secondary | ICD-10-CM

## 2013-02-09 DIAGNOSIS — F0280 Dementia in other diseases classified elsewhere without behavioral disturbance: Secondary | ICD-10-CM

## 2013-02-09 DIAGNOSIS — I951 Orthostatic hypotension: Secondary | ICD-10-CM

## 2013-02-09 MED ORDER — DONEPEZIL HCL 5 MG PO TABS: 5.0000 mg | ORAL_TABLET | Freq: Every day | ORAL | Status: DC

## 2013-02-09 NOTE — Assessment & Plan Note (Signed)
Better since she has aides in daily Not socially isolated now

## 2013-02-09 NOTE — Patient Instructions (Signed)
Please start the donepezil 5mg  daily---can be at any time in the day. If she has no problems after 2-3 weeks, try increasing the dose to 10mg  daily.

## 2013-02-09 NOTE — Progress Notes (Signed)
  Subjective:    Patient ID: [name], female    DOB: [birth year], 79 y.o.   MRN: [id number]  HPI Here with daughter Physically she seems to be better They have aides 8AM-1PM----helping around house and assuring breakfast and lunch Daughter checks on her in the evening Still can only walk for short distances and has to rest--- not clear if dizziness or weakness or both Doesn't feel well in the morning but better all day  Inconsistent with pedialyte-- resists this to some degree Aides do try to increase salt  Doesn't seem to be depressed  Reviewed the hallucination Sees "adults" out the window Don't talk to her--not threatening Also has seen cat up on the windowsill Vision is okay  When she had gone to dentist, daughter dropped her off and came back to visit At first, she didn't recognize her when she came back  Current Outpatient Prescriptions on File Prior to Visit  Medication Sig Dispense Refill  . aspirin 81 MG EC tablet Take 81 mg by mouth daily.        . fludrocortisone (FLORINEF) 0.1 MG tablet 1 tab in AM, 2 tabs in PM  90 tablet  12   No current facility-administered medications on file prior to visit.    Allergies  Allergen Reactions  . Erythromycin     REACTION: GI upset  . Sulfonamide Derivatives     REACTION: GI upset    Past Medical History  Diagnosis Date  . Colon cancer     h/o colon CA 2010- colectomy 2010  (Dr. Gaylyn Rong with oncology)  . Anxiety     H/o anxiety, would use as needed diazepam with relief  . Thyroid disease     H/o thyroid nodule with benign bx 2010  . Orthostasis     h/o orthostasis, didn't tolerate midodrine, prev eval by Dr. Anne Fu  . Alzheimer's dementia     Past Surgical History  Procedure Laterality Date  . US echocardiography  [id number]    normal  . Abdominal hysterectomy    . Partial colectomy  2010    for colon CA    No family history on file.  History   Social History  . Marital Status: Widowed    Spouse Name:  N/A    Number of Children: 1  . Years of Education: N/A   Occupational History  . retired    Social History Main Topics  . Smoking status: Never Smoker   . Smokeless tobacco: Never Used  . Alcohol Use: Yes     Comment: rare  . Drug Use: No  . Sexually Active: Not on file   Other Topics Concern  . Not on file   Social History Narrative   Widow x2, lives alone.     1 adopted daughter, leaves near patient.    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 No distress.  Neurological:  Relates hallucinations No delusions now Not paranoid  Psychiatric:  Calm More conversant today Neutral mood and appropriate affect          Assessment & Plan:

## 2013-02-09 NOTE — Assessment & Plan Note (Signed)
Continued progression Will try donepezil Some hallucinations---not paranoia and not threatening Will not treat this

## 2013-02-09 NOTE — Assessment & Plan Note (Signed)
Still symptomatic in the morning but better as the day goes on No need for med changes now Will continue to encourage salt intake

## 2013-03-11 ENCOUNTER — Other Ambulatory Visit: Payer: Self-pay | Admitting: Internal Medicine

## 2013-03-22 ENCOUNTER — Encounter (INDEPENDENT_AMBULATORY_CARE_PROVIDER_SITE_OTHER): Payer: Medicare Other | Admitting: Ophthalmology

## 2013-03-22 DIAGNOSIS — H3530 Unspecified macular degeneration: Secondary | ICD-10-CM

## 2013-03-22 DIAGNOSIS — H353290 Exudative age-related macular degeneration, unspecified eye, stage unspecified: Secondary | ICD-10-CM

## 2013-03-22 DIAGNOSIS — H43819 Vitreous degeneration, unspecified eye: Secondary | ICD-10-CM

## 2013-03-27 ENCOUNTER — Telehealth: Payer: Self-pay

## 2013-03-27 NOTE — Telephone Encounter (Signed)
Have them stop the donepezil---the dementia pill Add it to allergy list for adverse reaction--- increased hallucinations

## 2013-03-27 NOTE — Telephone Encounter (Signed)
[name] pts daughter said for 2 weeks pt has been taking memory med(cannot remember name) one tab at night. Pt averages seeing people who are not there 3 x a week but 03/26/13 pt saw men cutting bushes in pts yard and neighbors yard(no men were in yard) continuously and pt continues to see men this morning. Pt having trouble resting at night since 03/25/13. On 03/26/13 pt brought back trash can from road to house and for few seconds had difficulty breathing but that resolved after pt rested. No CP, H/A or dizziness. Today pt is physically OK, no complaints but still seeing people that are not there; Pts care giver will stay with pt today. Difficult for [name] to bring pt to office today and scheduled appt on 03/28/13. [name] will cb if pts condition changes or worsens.

## 2013-03-27 NOTE — Telephone Encounter (Signed)
Left message for daughter to return my call, left message on vm.   Allergy list updated

## 2013-03-28 ENCOUNTER — Ambulatory Visit (INDEPENDENT_AMBULATORY_CARE_PROVIDER_SITE_OTHER): Payer: Medicare Other | Admitting: Internal Medicine

## 2013-03-28 ENCOUNTER — Encounter: Payer: Self-pay | Admitting: Internal Medicine

## 2013-03-28 VITALS — BP 120/60 | HR 75 | Temp 98.4°F | Wt 137.0 lb

## 2013-03-28 DIAGNOSIS — H5316 Psychophysical visual disturbances: Secondary | ICD-10-CM

## 2013-03-28 DIAGNOSIS — G309 Alzheimer's disease, unspecified: Secondary | ICD-10-CM

## 2013-03-28 DIAGNOSIS — I951 Orthostatic hypotension: Secondary | ICD-10-CM

## 2013-03-28 DIAGNOSIS — F0280 Dementia in other diseases classified elsewhere without behavioral disturbance: Secondary | ICD-10-CM

## 2013-03-28 DIAGNOSIS — R441 Visual hallucinations: Secondary | ICD-10-CM | POA: Insufficient documentation

## 2013-03-28 NOTE — Progress Notes (Signed)
Subjective:    Patient ID: [name], female    DOB: 1928-02-11, 79 y.o.   MRN: [id number]  HPI Here with daughter  Visual hallucinations have worsened Not clear if worse till Monday---possibly got 2 of the donepezil Was focused on the people she was seeing--- till yesterday (the next day) Now coming back to her baseline  Had only been on it for 2.5 weeks No clear cognitive changes  Doing better with the sitters in general Getting the flurinef bid from the sitters Has feeling in AM---"like I can't get up" Then does better by about 11 AM and feels better the rest of the day  2 days ago, had feeling of SOB for "just a second" while retrieving the garbage cans No chest pain Wasn't dizzy then Came back to house and sat down--resolved quickly  Current Outpatient Prescriptions on File Prior to Visit  Medication Sig Dispense Refill  . fludrocortisone (FLORINEF) 0.1 MG tablet TAKE 1 TABLET IN THE MORNING AND 2 TABLETS IN THE EVENING.  90 tablet  0   No current facility-administered medications on file prior to visit.    Allergies  Allergen Reactions  . Aricept (Donepezil Hcl)     Increased hallucinations   . Erythromycin     REACTION: GI upset  . Sulfonamide Derivatives     REACTION: GI upset    Past Medical History  Diagnosis Date  . Colon cancer     h/o colon CA 2010- colectomy 2010  (Dr. Gaylyn Rong with oncology)  . Anxiety     H/o anxiety, would use as needed diazepam with relief  . Thyroid disease     H/o thyroid nodule with benign bx 2010  . Orthostasis     h/o orthostasis, didn't tolerate midodrine, prev eval by Dr. Anne Fu  . Alzheimer's dementia     Past Surgical History  Procedure Laterality Date  . US echocardiography  [id number]    normal  . Abdominal hysterectomy    . Partial colectomy  2010    for colon CA    No family history on file.  History   Social History  . Marital Status: Widowed    Spouse Name: N/A    Number of Children: 1  . Years of  Education: N/A   Occupational History  . retired    Social History Main Topics  . Smoking status: Never Smoker   . Smokeless tobacco: Never Used  . Alcohol Use: Yes     Comment: rare  . Drug Use: No  . Sexually Active: Not on file   Other Topics Concern  . Not on file   Social History Narrative   Widow x2, lives alone.     1 adopted daughter, leaves near patient.    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 No distress.  Neck: Normal range of motion. Neck supple. No thyromegaly present.  Cardiovascular: Normal rate, regular rhythm and normal heart sounds.  Exam reveals no gallop.   No murmur heard. Pulmonary/Chest: Effort normal and breath sounds normal. No respiratory distress. She has no wheezes. She has no rales.  Musculoskeletal: She exhibits no edema and no tenderness.  Lymphadenopathy:    She has no cervical adenopathy.  Psychiatric:  Calm and engages appropriately          Assessment & Plan:

## 2013-03-28 NOTE — Assessment & Plan Note (Signed)
Doing okay with the flurinef Brief dyspnea 2 days ago---no signs of cardiopulmonary problems now. May be related to the side effects of med

## 2013-03-28 NOTE — Assessment & Plan Note (Signed)
Mild and stable Doing better with the increased supervision Discussed considering increasing the time

## 2013-03-28 NOTE — Assessment & Plan Note (Signed)
Worse with the donepezil Will stop  Doesn't need Rx for this otherwise

## 2013-04-04 ENCOUNTER — Other Ambulatory Visit: Payer: Self-pay

## 2013-04-06 ENCOUNTER — Other Ambulatory Visit: Payer: Self-pay | Admitting: *Deleted

## 2013-04-06 MED ORDER — FLUDROCORTISONE ACETATE 0.1 MG PO TABS: ORAL_TABLET | ORAL | Status: DC

## 2013-04-10 ENCOUNTER — Telehealth: Payer: Self-pay | Admitting: *Deleted

## 2013-04-10 NOTE — Telephone Encounter (Signed)
daughter calling asking for advice on what to do with her mother having hallucinations? Per daughter and caregiver this only used to be at night and now it's during the day and started yesterday afternoon and still going on this morning. Per daughter the pt had a old prescription of diazepam and she gave her one this morning and it helped to calm her down. Her daughter wants advice on what to do or what medication to give her when she gets like this. Pt uses [facility]

## 2013-04-10 NOTE — Telephone Encounter (Signed)
Spoke with daughter and pt has 2 mg of diazepam take up to 1 tablet by mouth every 6 hours as needed. Daughter scheduled appt for Friday 04/13/2013.

## 2013-04-10 NOTE — Telephone Encounter (Signed)
We may need to consider an antipsychotic like seroquel but I would like to hold off since these are associated with some cardiovascular problems when used in people with dementia  It is okay to once in a while use the diazepam if it seems to help. Try the lowest effective dose (like a half tab)--find out what dose she has Hopefully this will settle down but I will prescribe the antipsychotic if it continues to worsen  Not clear that this is related to the donepezil since worse after stopping it

## 2013-04-11 ENCOUNTER — Ambulatory Visit: Payer: Medicare Other | Admitting: Internal Medicine

## 2013-04-11 MED ORDER — DIAZEPAM 2 MG PO TABS: 2.0000 mg | ORAL_TABLET | Freq: Four times a day (QID) | ORAL | Status: DC | PRN

## 2013-04-11 NOTE — Telephone Encounter (Signed)
rx called into pharmacy Spoke with daughter and advised results.  

## 2013-04-11 NOTE — Telephone Encounter (Signed)
Ok to refill 

## 2013-04-11 NOTE — Telephone Encounter (Signed)
Yes, #30 x 0 for now

## 2013-04-11 NOTE — Telephone Encounter (Signed)
They can try 1/2-1 of the 2mg  for now. No meds for agitation or hallucinations are completely safe---if this helps, we might just stick with it

## 2013-04-12 ENCOUNTER — Encounter: Payer: Self-pay | Admitting: Radiology

## 2013-04-13 ENCOUNTER — Ambulatory Visit (INDEPENDENT_AMBULATORY_CARE_PROVIDER_SITE_OTHER): Payer: Medicare Other | Admitting: Internal Medicine

## 2013-04-13 ENCOUNTER — Encounter: Payer: Self-pay | Admitting: Internal Medicine

## 2013-04-13 VITALS — BP 128/70 | HR 65 | Temp 98.3°F | Wt 141.0 lb

## 2013-04-13 DIAGNOSIS — R441 Visual hallucinations: Secondary | ICD-10-CM

## 2013-04-13 DIAGNOSIS — H5316 Psychophysical visual disturbances: Secondary | ICD-10-CM

## 2013-04-13 MED ORDER — QUETIAPINE FUMARATE 25 MG PO TABS: 25.0000 mg | ORAL_TABLET | Freq: Every day | ORAL | Status: DC

## 2013-04-13 NOTE — Progress Notes (Signed)
  Subjective:    Patient ID: [name], female    DOB: 10/21/1927, 79 y.o.   MRN: [id number]  HPI Here with daughter Has had ongoing visual hallucinations--- people in and out of the house Noone she knows No auditory component Notes upon awakening but daughter states it is daily  Daughter gives history Usually starts after lunch People in house and working in yard Seems tense and fearful when she sees them--though she doesn't remember that now Calling her neighbors and friends with her concerns May be some better with daughter trying to placate her  Did stop the donepezil No other changes  "I would like for this not to happen again"  Current Outpatient Prescriptions on File Prior to Visit  Medication Sig Dispense Refill  . aspirin 81 MG tablet Take 81 mg by mouth daily.      . diazepam (VALIUM) 2 MG tablet Take 1 tablet (2 mg total) by mouth every 6 (six) hours as needed.  30 tablet  0  . fludrocortisone (FLORINEF) 0.1 MG tablet TAKE 1 TABLET IN THE MORNING AND 2 TABLETS IN THE EVENING.  90 tablet  3   No current facility-administered medications on file prior to visit.    Allergies  Allergen Reactions  . Aricept [Donepezil Hcl]     Increased hallucinations   . Erythromycin     REACTION: GI upset  . Sulfonamide Derivatives     REACTION: GI upset    Past Medical History  Diagnosis Date  . Colon cancer     h/o colon CA 2010- colectomy 2010  (Dr. Gaylyn Rong with oncology)  . Anxiety     H/o anxiety, would use as needed diazepam with relief  . Thyroid disease     H/o thyroid nodule with benign bx 2010  . Orthostasis     h/o orthostasis, didn't tolerate midodrine, prev eval by Dr. Anne Fu  . Alzheimer's dementia     Past Surgical History  Procedure Laterality Date  . US echocardiography  [id number]    normal  . Abdominal hysterectomy    . Partial colectomy  2010    for colon CA    No family history on file.  History   Social History  . Marital Status: Widowed     Spouse Name: N/A    Number of Children: 1  . Years of Education: N/A   Occupational History  . retired    Social History Main Topics  . Smoking status: Never Smoker   . Smokeless tobacco: Never Used  . Alcohol Use: Yes     Comment: rare  . Drug Use: No  . Sexual Activity: Not on file   Other Topics Concern  . Not on file   Social History Narrative   Widow x2, lives alone.     1 adopted daughter, leaves near patient.    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 Her behavior is normal.  Calm and normal interaction          Assessment & Plan:

## 2013-04-13 NOTE — Patient Instructions (Signed)
Please try the quetiapine 25mg  at bedtime for 3 days. If the hallucinations continue, increase to 50mg  at bedtime.

## 2013-04-13 NOTE — Assessment & Plan Note (Signed)
Worse and causing some distress Clearly doesn't seem to be from the donepezil ---worse now since off Will try low dose seroquel Discussed side effects and concerns about CV mortality with the daughter

## 2013-05-07 ENCOUNTER — Encounter: Payer: Self-pay | Admitting: Radiology

## 2013-05-08 ENCOUNTER — Ambulatory Visit: Payer: Medicare Other | Admitting: Internal Medicine

## 2013-05-08 DIAGNOSIS — Z0289 Encounter for other administrative examinations: Secondary | ICD-10-CM

## 2013-05-31 ENCOUNTER — Encounter (INDEPENDENT_AMBULATORY_CARE_PROVIDER_SITE_OTHER): Payer: Medicare Other | Admitting: Ophthalmology

## 2013-05-31 DIAGNOSIS — H43819 Vitreous degeneration, unspecified eye: Secondary | ICD-10-CM

## 2013-05-31 DIAGNOSIS — H3530 Unspecified macular degeneration: Secondary | ICD-10-CM

## 2013-05-31 DIAGNOSIS — H353290 Exudative age-related macular degeneration, unspecified eye, stage unspecified: Secondary | ICD-10-CM

## 2013-07-05 ENCOUNTER — Other Ambulatory Visit: Payer: Self-pay

## 2013-08-03 ENCOUNTER — Emergency Department (HOSPITAL_COMMUNITY): Payer: Medicare Other

## 2013-08-03 ENCOUNTER — Encounter (HOSPITAL_COMMUNITY): Payer: Self-pay | Admitting: Emergency Medicine

## 2013-08-03 ENCOUNTER — Inpatient Hospital Stay (HOSPITAL_COMMUNITY)
Admission: EM | Admit: 2013-08-03 | Discharge: 2013-08-07 | DRG: 312 | Disposition: A | Discharge status: Skilled Nursing Facility | Payer: Medicare Other | Attending: Internal Medicine | Admitting: Internal Medicine

## 2013-08-03 ENCOUNTER — Other Ambulatory Visit: Payer: Self-pay

## 2013-08-03 DIAGNOSIS — F0630 Mood disorder due to known physiological condition, unspecified: Secondary | ICD-10-CM | POA: Diagnosis present

## 2013-08-03 DIAGNOSIS — F329 Major depressive disorder, single episode, unspecified: Secondary | ICD-10-CM | POA: Diagnosis present

## 2013-08-03 DIAGNOSIS — F03918 Unspecified dementia, unspecified severity, with other behavioral disturbance: Secondary | ICD-10-CM | POA: Diagnosis present

## 2013-08-03 DIAGNOSIS — H5316 Psychophysical visual disturbances: Secondary | ICD-10-CM | POA: Diagnosis present

## 2013-08-03 DIAGNOSIS — Z9049 Acquired absence of other specified parts of digestive tract: Secondary | ICD-10-CM

## 2013-08-03 DIAGNOSIS — Z7982 Long term (current) use of aspirin: Secondary | ICD-10-CM

## 2013-08-03 DIAGNOSIS — E876 Hypokalemia: Secondary | ICD-10-CM | POA: Diagnosis present

## 2013-08-03 DIAGNOSIS — R55 Syncope and collapse: Principal | ICD-10-CM

## 2013-08-03 DIAGNOSIS — F3289 Other specified depressive episodes: Secondary | ICD-10-CM | POA: Diagnosis present

## 2013-08-03 DIAGNOSIS — F0391 Unspecified dementia with behavioral disturbance: Secondary | ICD-10-CM | POA: Diagnosis present

## 2013-08-03 DIAGNOSIS — R441 Visual hallucinations: Secondary | ICD-10-CM | POA: Diagnosis present

## 2013-08-03 DIAGNOSIS — F411 Generalized anxiety disorder: Secondary | ICD-10-CM

## 2013-08-03 DIAGNOSIS — F0280 Dementia in other diseases classified elsewhere without behavioral disturbance: Secondary | ICD-10-CM | POA: Diagnosis present

## 2013-08-03 DIAGNOSIS — I951 Orthostatic hypotension: Secondary | ICD-10-CM | POA: Diagnosis present

## 2013-08-03 DIAGNOSIS — Z85038 Personal history of other malignant neoplasm of large intestine: Secondary | ICD-10-CM

## 2013-08-03 DIAGNOSIS — G309 Alzheimer's disease, unspecified: Secondary | ICD-10-CM | POA: Diagnosis present

## 2013-08-03 DIAGNOSIS — R404 Transient alteration of awareness: Secondary | ICD-10-CM | POA: Diagnosis present

## 2013-08-03 DIAGNOSIS — R9431 Abnormal electrocardiogram [ECG] [EKG]: Secondary | ICD-10-CM

## 2013-08-03 DIAGNOSIS — R0681 Apnea, not elsewhere classified: Secondary | ICD-10-CM | POA: Diagnosis present

## 2013-08-03 DIAGNOSIS — R42 Dizziness and giddiness: Secondary | ICD-10-CM | POA: Diagnosis present

## 2013-08-03 DIAGNOSIS — IMO0002 Reserved for concepts with insufficient information to code with codable children: Secondary | ICD-10-CM

## 2013-08-03 HISTORY — DX: Other specified postprocedural states: R11.2

## 2013-08-03 HISTORY — DX: Other specified postprocedural states: Z98.890

## 2013-08-03 LAB — COMPREHENSIVE METABOLIC PANEL
AST: 14 U/L (ref 0–37)
Alkaline Phosphatase: 62 U/L (ref 39–117)
BUN: 16 mg/dL (ref 6–23)
CO2: 33 mEq/L — ABNORMAL HIGH (ref 19–32)
Calcium: 8.7 mg/dL (ref 8.4–10.5)
Creatinine, Ser: 0.9 mg/dL (ref 0.50–1.10)
GFR calc Af Amer: 67 mL/min — ABNORMAL LOW (ref >90)
GFR calc non Af Amer: 58 mL/min — ABNORMAL LOW (ref >90)
Glucose, Bld: 114 mg/dL — ABNORMAL HIGH (ref 70–99)
Sodium: 140 mEq/L (ref 135–145)
Total Protein: 6.3 g/dL (ref 6.0–8.3)

## 2013-08-03 LAB — URINE MICROSCOPIC-ADD ON

## 2013-08-03 LAB — TSH: TSH: 1.157 u[IU]/mL (ref 0.350–4.500)

## 2013-08-03 LAB — URINALYSIS, ROUTINE W REFLEX MICROSCOPIC
Nitrite: NEGATIVE
Protein, ur: NEGATIVE mg/dL
Specific Gravity, Urine: 1.006 (ref 1.005–1.030)
Urobilinogen, UA: 0.2 mg/dL (ref 0.0–1.0)
pH: 7 (ref 5.0–8.0)

## 2013-08-03 LAB — CBC WITH DIFFERENTIAL/PLATELET
Basophils Absolute: 0 10*3/uL (ref 0.0–0.1)
Eosinophils Absolute: 0.1 10*3/uL (ref 0.0–0.7)
Eosinophils Relative: 1 % (ref 0–5)
HCT: 34.5 % — ABNORMAL LOW (ref 36.0–46.0)
Lymphocytes Relative: 20 % (ref 12–46)
MCH: 28.4 pg (ref 26.0–34.0)
MCV: 87.6 fL (ref 78.0–100.0)
Monocytes Absolute: 0.4 10*3/uL (ref 0.1–1.0)
Monocytes Relative: 7 % (ref 3–12)
Platelets: 198 10*3/uL (ref 150–400)
RDW: 14.7 % (ref 11.5–15.5)
WBC: 5.8 10*3/uL (ref 4.0–10.5)

## 2013-08-03 LAB — POCT I-STAT TROPONIN I: Troponin i, poc: 0.01 ng/mL (ref 0.00–0.08)

## 2013-08-03 LAB — TROPONIN I
Troponin I: <0.3 ng/mL (ref <0.30)
Troponin I: <0.3 ng/mL (ref <0.30)

## 2013-08-03 MED ORDER — DIAZEPAM 2 MG PO TABS
2.0000 mg | ORAL_TABLET | Freq: Four times a day (QID) | ORAL | Status: DC | PRN
  Administered 2013-08-04: 04:00:00 2 mg via ORAL
  Filled 2013-08-03: qty 1

## 2013-08-03 MED ORDER — ENOXAPARIN SODIUM 40 MG/0.4ML ~~LOC~~ SOLN
40.0000 mg | SUBCUTANEOUS | Status: DC
  Administered 2013-08-03 – 2013-08-06 (×4): 40 mg via SUBCUTANEOUS
  Filled 2013-08-03 (×5): qty 0.4

## 2013-08-03 MED ORDER — FLUDROCORTISONE ACETATE 0.1 MG PO TABS: 0.1000 mg | ORAL_TABLET | Freq: Every day | ORAL | Status: DC

## 2013-08-03 MED ORDER — FLUDROCORTISONE ACETATE 0.1 MG PO TABS
0.2000 mg | ORAL_TABLET | Freq: Every day | ORAL | Status: DC
  Administered 2013-08-03 – 2013-08-06 (×3): 0.2 mg via ORAL
  Filled 2013-08-03 (×5): qty 2

## 2013-08-03 MED ORDER — SODIUM CHLORIDE 0.9 % IV BOLUS (SEPSIS)
1000.0000 mL | Freq: Once | INTRAVENOUS | Status: AC
  Administered 2013-08-03: 1000 mL via INTRAVENOUS

## 2013-08-03 MED ORDER — ASPIRIN 81 MG PO CHEW
81.0000 mg | CHEWABLE_TABLET | Freq: Every day | ORAL | Status: DC
  Administered 2013-08-05 – 2013-08-07 (×3): 81 mg via ORAL
  Filled 2013-08-03 (×4): qty 1

## 2013-08-03 MED ORDER — POTASSIUM CHLORIDE 10 MEQ/100ML IV SOLN
10.0000 meq | Freq: Once | INTRAVENOUS | Status: AC
Start: 2013-08-03 — End: 2013-08-03
  Administered 2013-08-03: 10 meq via INTRAVENOUS
  Filled 2013-08-03: qty 100

## 2013-08-03 MED ORDER — SODIUM CHLORIDE 0.9 % IJ SOLN
3.0000 mL | Freq: Two times a day (BID) | INTRAMUSCULAR | Status: DC
  Administered 2013-08-03 – 2013-08-07 (×4): 3 mL via INTRAVENOUS

## 2013-08-03 MED ORDER — SODIUM CHLORIDE 0.9 % IV SOLN
INTRAVENOUS | Status: DC
  Administered 2013-08-03 – 2013-08-05 (×5): via INTRAVENOUS

## 2013-08-03 MED ORDER — FLUDROCORTISONE ACETATE 0.1 MG PO TABS
0.1000 mg | ORAL_TABLET | Freq: Every day | ORAL | Status: DC
  Administered 2013-08-05 – 2013-08-07 (×3): 0.1 mg via ORAL
  Filled 2013-08-03 (×4): qty 1

## 2013-08-03 MED ORDER — SODIUM CHLORIDE 0.9 % IV SOLN
INTRAVENOUS | Status: AC
  Administered 2013-08-03: 19:00:00 via INTRAVENOUS

## 2013-08-03 MED ORDER — POTASSIUM CHLORIDE CRYS ER 20 MEQ PO TBCR
40.0000 meq | EXTENDED_RELEASE_TABLET | Freq: Once | ORAL | Status: AC
  Administered 2013-08-03: 40 meq via ORAL
  Filled 2013-08-03: qty 2

## 2013-08-03 NOTE — ED Provider Notes (Addendum)
CSN: [id number]     Arrival date & time 08/03/13  1152 History   First MD Initiated Contact with Patient 08/03/13 1220     Chief Complaint  Patient presents with  . Loss of Consciousness   (Consider location/radiation/quality/duration/timing/severity/associated sxs/prior Treatment) The history is provided by the patient and a caregiver.  [name] is a 79 y.o. female hx of colon Ca, dementia here with AMS. This morning she woke up with some nausea. Denies any chest pain. Her aide came in and she fell a little lightheaded and dizzy. She then passed out but did not fall or hit her head. States that she stopped breathing for several minutes and her lips turned blue but they did not have to initiate chest compressions. She has episodes of passing out before and is on Florinef at home. However she doesn't usually get apnea from passing out. No seizure activity. Also had some visual hallucinations today, but has been having them intermittently.   Level V caveat- AMS   Past Medical History  Diagnosis Date  . Colon cancer     h/o colon CA 2010- colectomy 2010  (Dr. Gaylyn [name] with oncology)  . Anxiety     H/o anxiety, would use as needed diazepam with relief  . Thyroid disease     H/o thyroid nodule with benign bx 2010  . Orthostasis     h/o orthostasis, didn't tolerate midodrine, prev eval by Dr. Anne [name]  . Alzheimer's dementia    Past Surgical History  Procedure Laterality Date  . US echocardiography  [id number]    normal  . Abdominal hysterectomy    . Partial colectomy  2010    for colon CA   No family history on file. History  Substance Use Topics  . Smoking status: Never Smoker   . Smokeless tobacco: Never Used  . Alcohol Use: Yes     Comment: rare   OB History   Grav Para Term Preterm Abortions TAB SAB Ect Mult Living                 Review of Systems  Cardiovascular: Positive for syncope.  Neurological: Positive for syncope.  All other systems reviewed and are  negative.    Allergies  Aricept; Erythromycin; and Sulfonamide derivatives  Home Medications   Current Outpatient Rx  Name  Route  Sig  Dispense  Refill  . aspirin 81 MG tablet   Oral   Take 81 mg by mouth daily.         . diazepam (VALIUM) 2 MG tablet   Oral   Take 2 mg by mouth every 6 (six) hours as needed for anxiety.         . fludrocortisone (FLORINEF) 0.1 MG tablet   Oral   Take 0.1-0.2 mg by mouth daily. 1 tablet in the morning and 2 tablets in the evening         . QUEtiapine (SEROQUEL) 25 MG tablet   Oral   Take 1 tablet (25 mg total) by mouth at bedtime.   60 tablet   3    BP 113/54  Pulse 71  Temp(Src) 97.6 F (36.4 C) (Oral)  SpO2 100% Physical Exam  Nursing note and vitals reviewed. Constitutional: She is oriented to person, place, and time.  Chronically ill, NAD   HENT:  Head: Normocephalic.  Mouth/Throat: Oropharynx is clear and moist.  Eyes: Conjunctivae are normal. Pupils are equal, round, and reactive to light.  Neck: Normal range of motion.  Neck supple.  Cardiovascular: Normal rate, regular rhythm and normal heart sounds.   Pulmonary/Chest: Effort normal and breath sounds normal. No respiratory distress. She has no wheezes. She has no rales.  Abdominal: Soft. Bowel sounds are normal. She exhibits no distension. There is no tenderness. There is no rebound and no guarding.  Musculoskeletal: Normal range of motion.  Neurological: She is alert and oriented to person, place, and time. No cranial nerve deficit. Coordination normal.  Nl strength and sensation throughout   Skin: Skin is warm and dry.  Psychiatric: She has a normal mood and affect. Her behavior is normal. Judgment and thought content normal.    ED Course  Procedures (including critical care time) Labs Review Labs Reviewed  CBC WITH DIFFERENTIAL - Abnormal; Notable for the following:    Hemoglobin 11.2 (*)    HCT 34.5 (*)    All other components within normal limits   COMPREHENSIVE METABOLIC PANEL - Abnormal; Notable for the following:    Potassium 2.9 (*)    CO2 33 (*)    Glucose, Bld 114 (*)    Albumin 3.2 (*)    GFR calc non Af Amer 58 (*)    GFR calc Af Amer 67 (*)    All other components within normal limits  URINALYSIS, ROUTINE W REFLEX MICROSCOPIC - Abnormal; Notable for the following:    Hgb urine dipstick TRACE (*)    All other components within normal limits  URINE MICROSCOPIC-ADD ON  POCT I-STAT TROPONIN I   Imaging Review Dg Chest 2 View  08/03/2013   CLINICAL DATA:  Loss of consciousness  CHEST  2 VIEW  COMPARISON:  05/17/2011  FINDINGS: Cardiomediastinal silhouette is stable. Mild hyperinflation. No acute infiltrate or pulmonary edema. Minimal degenerative changes thoracic spine.  IMPRESSION: No active disease.  Mild hyperinflation.   Electronically Signed   By: [name] M.D.   On: 08/03/2013 13:44   Ct Head Wo Contrast  08/03/2013   CLINICAL DATA:  Loss of consciousness.  EXAM: CT HEAD WITHOUT CONTRAST  TECHNIQUE: Contiguous axial images were obtained from the base of the skull through the vertex without intravenous contrast.  COMPARISON:  CT scan of February 01, 2011.  FINDINGS: Bony calvarium appears intact. Diffuse cortical atrophy is noted. Chronic ischemic white matter disease is noted. No mass effect or midline shift is noted. Ventricular size is within normal limits. There is no evidence of mass lesion, hemorrhage or acute infarction.  IMPRESSION: Diffuse cortical atrophy. Chronic ischemic white matter disease. No acute intracranial abnormality seen.   Electronically Signed   By: [name] M.D.   On: 08/03/2013 12:42    EKG Interpretation   None       Date: 08/03/2013  Rate: 68  Rhythm: normal sinus rhythm  QRS Axis: normal  Intervals: normal  ST/T Wave abnormalities: nonspecific ST changes  Conduction Disutrbances:none  Narrative Interpretation: QTc prolonged   Old EKG Reviewed: none available    MDM  No diagnosis  found. [name] is a 79 y.o. female here with syncope and was briefly apneic. Will get labs, trop. QTc prolonged, concerned for possible arrhythmias. Will admit to tele for monitoring.   2:55 PM K 2.9, supplemented. Infectious workup done for visual hallucinations. No obvious UTI or pneumonia. Will admit to tele under observation under Dr. Wyonia Hough.     Richardean Canal, MD 08/03/13 1455  Richardean Canal, MD 08/03/13 [phone]

## 2013-08-03 NOTE — ED Notes (Signed)
Per EMS: Two witnesses, one states pt lost consciousness and was apneic for a 2-3 minutes; other witness states pt had near-syncopal episode and no apnea. No fall, no trauma. Pt states she remembers people talking to her and she was unable to respond. Pt now A&O x 4, was conscious when EMS arrived on scene. Pt denies chest pain. Pt has chronic R hip pain.

## 2013-08-03 NOTE — H&P (Signed)
[facility] History and Physical  [name]:[id number] DOB: [birth year] DOA: 08/03/2013  Referring physician: Dr. Silverio Lay PCP: [name] Abide, MD  Specialists: none  Chief Complaint: Syncope  HPI: [name] is a 79 y.o. female has a past medical history significant for colon cancer 4 years ago status post colectomy in 2010, in remission, anxiety, Alzheimer dementia, recurrent syncopal episode on home Florinef, presents to the emergency room with a syncopal episode. Patient can contribute some to the story however her daughter provides most of the information. She was at home today with one of the her aids, when patient fell lightheaded and dizzy, and then passed out did not fall or hit her head. The aid states that she stopped breathing for several minutes and her lips turned blue. There was no reported loss of pulse. She has her blood pressure checked, and it was "very low". There is no reported seizure-like activity, however patient was very confused after this episode. She has on and off for visual hallucinations, and her dementia has been worsening in the past year. Her daughter feels like hallucinations were more prominent this week. She was  started on low-dose Seroquel in August, however she took couple of doses at that time and then the patient refused. Recently in the past week her daughter started trying to give her back her Seroquel and she has been taking that for the past few days except for last night. There are no reports of fever, chills, chest pain, palpitations. Patient denies any symptoms currently.  Review of Systems: As per history of present illness, otherwise negative, however patient with mild dementia.  Past Medical History  Diagnosis Date  . Colon cancer     h/o colon CA 2010- colectomy 2010  (Dr. Gaylyn [name] with oncology)  . Anxiety     H/o anxiety, would use as needed diazepam with relief  . Thyroid disease     H/o thyroid nodule with benign bx 2010  .  Orthostasis     h/o orthostasis, didn't tolerate midodrine, prev eval by Dr. Anne Fu  . Alzheimer's dementia   . PONV (postoperative nausea and vomiting)    Past Surgical History  Procedure Laterality Date  . US echocardiography  [id number]    normal  . Abdominal hysterectomy    . Partial colectomy  2010    for colon CA   Social History:  reports that she has never smoked. She has never used smokeless tobacco. She reports that she drinks alcohol. She reports that she does not use illicit drugs.  Allergies  Allergen Reactions  . Aricept [Donepezil Hcl]     Increased hallucinations   . Erythromycin     REACTION: GI upset  . Sulfonamide Derivatives     REACTION: GI upset   History reviewed. No pertinent family history.   Prior to Admission medications   Medication Sig Start Date End Date Taking? Authorizing Provider  aspirin 81 MG tablet Take 81 mg by mouth daily.   Yes Historical Provider, MD  diazepam (VALIUM) 2 MG tablet Take 2 mg by mouth every 6 (six) hours as needed for anxiety.   Yes Historical Provider, MD  fludrocortisone (FLORINEF) 0.1 MG tablet Take 0.1-0.2 mg by mouth daily. 1 tablet in the morning and 2 tablets in the evening   Yes Historical Provider, MD  QUEtiapine (SEROQUEL) 25 MG tablet Take 1 tablet (25 mg total) by mouth at bedtime. 04/13/13  Yes [name] Schwalbe, MD   Physical Exam: Filed Vitals:  08/03/13 1204 08/03/13 1314 08/03/13 1322 08/03/13 1323  BP: 123/54 172/75 158/73 113/54  Pulse: 72 69 65 71  Temp: 97.6 F (36.4 C)     TempSrc: Oral     SpO2: 100%        General:  No apparent distress  Eyes: PERRL, EOMI, no scleral icterus  ENT: moist oropharynx  Neck: supple, no JVD  Cardiovascular: regular rate without MRG; 2+ peripheral pulses  Respiratory: CTA biL, good air movement without wheezing, rhonchi or crackled  Abdomen: soft, non tender to palpation, positive bowel sounds, no guarding, no rebound  Skin: no rashes  Musculoskeletal:  no peripheral edema  Psychiatric: normal mood and affect  Neurologic: CN 2-12 grossly intact, MS 5/5 in all 4  Labs on Admission:  Basic Metabolic Panel:  Recent Labs Lab 08/03/13 1255  NA 140  K 2.9*  CL 100  CO2 33*  GLUCOSE 114*  BUN 16  CREATININE 0.90  CALCIUM 8.7   Liver Function Tests:  Recent Labs Lab 08/03/13 1255  AST 14  ALT 6  ALKPHOS 62  BILITOT 0.3  PROT 6.3  ALBUMIN 3.2*   CBC:  Recent Labs Lab 08/03/13 1255  WBC 5.8  NEUTROABS 4.1  HGB 11.2*  HCT 34.5*  MCV 87.6  PLT 198   Radiological Exams on Admission: Dg Chest 2 View  08/03/2013   CLINICAL DATA:  Loss of consciousness  CHEST  2 VIEW  COMPARISON:  05/17/2011  FINDINGS: Cardiomediastinal silhouette is stable. Mild hyperinflation. No acute infiltrate or pulmonary edema. Minimal degenerative changes thoracic spine.  IMPRESSION: No active disease.  Mild hyperinflation.   Electronically Signed   By: [name] M.D.   On: 08/03/2013 13:44   Ct Head Wo Contrast  08/03/2013   CLINICAL DATA:  Loss of consciousness.  EXAM: CT HEAD WITHOUT CONTRAST  TECHNIQUE: Contiguous axial images were obtained from the base of the skull through the vertex without intravenous contrast.  COMPARISON:  CT scan of February 01, 2011.  FINDINGS: Bony calvarium appears intact. Diffuse cortical atrophy is noted. Chronic ischemic white matter disease is noted. No mass effect or midline shift is noted. Ventricular size is within normal limits. There is no evidence of mass lesion, hemorrhage or acute infarction.  IMPRESSION: Diffuse cortical atrophy. Chronic ischemic white matter disease. No acute intracranial abnormality seen.   Electronically Signed   By: [name] M.D.   On: 08/03/2013 12:42    EKG: Independently reviewed.  Assessment/Plan Principal Problem:   Syncope Active Problems:   POSTURAL HYPOTENSION   Alzheimer's dementia   Depression   Visual hallucinations   Syncope - patient has a long-standing history of  syncopal episodes, however this one was different in the fact that she had a period of apnea. Given somewhat a possible postictal state, we'll obtain an EEG. Admit on telemetry, obtain 2-D echo. Initial troponin in the ED negative we'll obtain 2 more x6 hours. Obtain TSH. Gentle hydration overnight, and repeat orthostatic vital signs in the morning. Continue Florinef. - Her QTC on the EKG is prolonged at 488, and I would favor stopping the Seroquel for now, especially given persistent hypokalemia. I doubt that she had a cardiac arrhythmia of that nature at home given the fact that she reportedly never lost the pulse. Hypokalemia - possibly due to Florinef, she was supposed to be on supplements, however patient is not able to tolerate oral potassium because "the pills are too big". Will replete and monitor.  Alzheimer dementia -  seems like she has been progressing in the past year with more hallucinations.   Diet: heart healthy Fluids: NS 75 cc/h DVT Prophylaxis: Lovenox  Code Status: Full code  Family Communication: Daughter at bedside  Disposition Plan: Observation  Time spent: 64  Dianca Owensby M. Elvera Lennox, MD [facility] Pager [phone]  If 7PM-7AM, please contact night-coverage [email or web address] Password [facility] 08/03/2013, 4:26 PM

## 2013-08-03 NOTE — ED Notes (Signed)
H/o hallucinations and hypotension. Pt takes Seroquel and fludrocortisone.

## 2013-08-03 NOTE — ED Notes (Signed)
Pt in xray

## 2013-08-04 DIAGNOSIS — I059 Rheumatic mitral valve disease, unspecified: Secondary | ICD-10-CM

## 2013-08-04 DIAGNOSIS — F411 Generalized anxiety disorder: Secondary | ICD-10-CM

## 2013-08-04 DIAGNOSIS — F0280 Dementia in other diseases classified elsewhere without behavioral disturbance: Secondary | ICD-10-CM

## 2013-08-04 LAB — CBC
HCT: 34.8 % — ABNORMAL LOW (ref 36.0–46.0)
Hemoglobin: 10.9 g/dL — ABNORMAL LOW (ref 12.0–15.0)
MCH: 27.6 pg (ref 26.0–34.0)
MCHC: 31.3 g/dL (ref 30.0–36.0)
MCV: 88.1 fL (ref 78.0–100.0)
RDW: 15 % (ref 11.5–15.5)
WBC: 6.3 10*3/uL (ref 4.0–10.5)

## 2013-08-04 LAB — BASIC METABOLIC PANEL
BUN: 15 mg/dL (ref 6–23)
Chloride: 104 mEq/L (ref 96–112)
Creatinine, Ser: 0.83 mg/dL (ref 0.50–1.10)
Glucose, Bld: 91 mg/dL (ref 70–99)
Potassium: 3.5 mEq/L (ref 3.5–5.1)
Sodium: 141 mEq/L (ref 135–145)

## 2013-08-04 LAB — TROPONIN I: Troponin I: <0.3 ng/mL (ref <0.30)

## 2013-08-04 MED ORDER — LORAZEPAM 2 MG/ML IJ SOLN
1.0000 mg | INTRAMUSCULAR | Status: DC | PRN
  Administered 2013-08-04 (×2): 1 mg via INTRAVENOUS
  Filled 2013-08-04 (×2): qty 1

## 2013-08-04 MED ORDER — DIAZEPAM 2 MG PO TABS
2.0000 mg | ORAL_TABLET | Freq: Once | ORAL | Status: AC
  Administered 2013-08-04: 2 mg via ORAL
  Filled 2013-08-04: qty 1

## 2013-08-04 MED ORDER — LORAZEPAM 2 MG/ML IJ SOLN
0.5000 mg | INTRAMUSCULAR | Status: DC | PRN
  Administered 2013-08-04: 09:00:00 0.5 mg via INTRAVENOUS
  Filled 2013-08-04: qty 1

## 2013-08-04 NOTE — Progress Notes (Signed)
Utilization Review completed.  

## 2013-08-04 NOTE — Progress Notes (Signed)
Pt after IV Ativan now calmer and resting. Will maintain all safety measures and monitor Pt closely

## 2013-08-04 NOTE — Progress Notes (Signed)
[facility] PROGRESS NOTE      [name] ZOX:[id number] DOB: 23-Aug-1928 DOA: 08/03/2013 PCP: Tillman Abide, MD  Assessment/Plan: Dementia with psychosis and hallucinations - severe this morning and patient very agitated. I am worried that with her prolonged QTC and recent syncope might not be safe to use QT prolonging agents which include most antipsychotics. I have consulted psychiatry to assist in medication selection and dose, appreciate input.  Syncope - patient has a long-standing history of syncopal episodes, however this one was different in the fact that she had a period of apnea.  - given severity of #1, unable to further work this up. QT prolongation Hypokalemia - possibly due to Florinef, she was supposed to be on supplements, however patient is not able to tolerate oral potassium because "the pills are too big".  - better this morning.   Diet: heart Fluids: NS DVT Prophylaxis: Lovenox  Code Status: Full Family Communication: daughter  Disposition Plan: inpatient   Consultants:  Psychiatry  Procedures:  none   Antibiotics  Anti-infectives   None     Antibiotics Given (last 72 hours)   None      HPI/Subjective: - very confused, agitated and psychotic.   Objective: Filed Vitals:   08/03/13 2135 08/04/13 0500 08/04/13 0501 08/04/13 0502  BP: 159/76 166/83 182/76 140/65  Pulse: 69 67 64 70  Temp: 98.2 F (36.8 C) 98.1 F (36.7 C)    TempSrc: Oral Oral    Resp: 16 18    Height:      Weight:      SpO2: 96% 98%      Intake/Output Summary (Last 24 hours) at 08/04/13 1224 Last data filed at 08/04/13 1123  Gross per 24 hour  Intake   2120 ml  Output   1780 ml  Net    340 ml   Filed Weights   08/03/13 1650  Weight: 68.2 kg (150 lb 5.7 oz)    Exam: Unable to do psychical exam due to severe agitation.   Data Reviewed: Basic Metabolic Panel:  Recent Labs Lab 08/03/13 1255 08/04/13 0415  NA 140 141  K 2.9* 3.5  CL 100  104  CO2 33* 26  GLUCOSE 114* 91  BUN 16 15  CREATININE 0.90 0.83  CALCIUM 8.7 9.1   Liver Function Tests:  Recent Labs Lab 08/03/13 1255  AST 14  ALT 6  ALKPHOS 62  BILITOT 0.3  PROT 6.3  ALBUMIN 3.2*   CBC:  Recent Labs Lab 08/03/13 1255 08/04/13 0415  WBC 5.8 6.3  NEUTROABS 4.1  --   HGB 11.2* 10.9*  HCT 34.5* 34.8*  MCV 87.6 88.1  PLT 198 209   Cardiac Enzymes:  Recent Labs Lab 08/03/13 1642 08/03/13 2258 08/04/13 0415  TROPONINI <0.30 <0.30 <0.30   Studies: Dg Chest 2 View  08/03/2013   CLINICAL DATA:  Loss of consciousness  CHEST  2 VIEW  COMPARISON:  05/17/2011  FINDINGS: Cardiomediastinal silhouette is stable. Mild hyperinflation. No acute infiltrate or pulmonary edema. Minimal degenerative changes thoracic spine.  IMPRESSION: No active disease.  Mild hyperinflation.   Electronically Signed   By: Natasha Mead M.D.   On: 08/03/2013 13:44   Ct Head Wo Contrast  08/03/2013   CLINICAL DATA:  Loss of consciousness.  EXAM: CT HEAD WITHOUT CONTRAST  TECHNIQUE: Contiguous axial images were obtained from the base of the skull through the vertex without intravenous contrast.  COMPARISON:  CT scan of February 01, 2011.  FINDINGS:  Bony calvarium appears intact. Diffuse cortical atrophy is noted. Chronic ischemic white matter disease is noted. No mass effect or midline shift is noted. Ventricular size is within normal limits. There is no evidence of mass lesion, hemorrhage or acute infarction.  IMPRESSION: Diffuse cortical atrophy. Chronic ischemic white matter disease. No acute intracranial abnormality seen.   Electronically Signed   By: Roque Lias M.D.   On: 08/03/2013 12:42    Scheduled Meds: . aspirin  81 mg Oral Daily  . enoxaparin (LOVENOX) injection  40 mg Subcutaneous Q24H  . fludrocortisone  0.1 mg Oral Daily   And  . fludrocortisone  0.2 mg Oral q1800  . sodium chloride  3 mL Intravenous Q12H   Continuous Infusions: . sodium chloride 75 mL/hr at 08/04/13 0019     Principal Problem:   Syncope Active Problems:   POSTURAL HYPOTENSION   Alzheimer's dementia   Depression   Visual hallucinations  Time spent: 25  Pamella Pert, MD [facility] Pager [phone]. If 7 PM - 7 AM, please contact night-coverage at [email or web address], password [facility] 08/04/2013, 12:24 PM  LOS: 1 day

## 2013-08-04 NOTE — Consult Note (Signed)
[facility] Face-to-Face Psychiatry Consult   Reason for Consult:  Worsening agitation Referring Physician:  Dr. Starr [place] [name] is an 79 y.o. female.  Assessment: AXIS I:  Dementia worsening AXIS II:  deferred AXIS III:   Past Medical History  Diagnosis Date  . Colon cancer     h/o colon CA 2010- colectomy 2010  (Dr. Gaylyn Rong with oncology)  . Anxiety     H/o anxiety, would use as needed diazepam with relief  . Thyroid disease     H/o thyroid nodule with benign bx 2010  . Orthostasis     h/o orthostasis, didn't tolerate midodrine, prev eval by Dr. Anne Fu  . Alzheimer's dementia   . PONV (postoperative nausea and vomiting)    AXIS IV:  other psychosocial or environmental problems AXIS V:  31-40 impairment in reality testing  Plan:  Patient does not meet criteria for psychiatric inpatient admission. She will need a higher level of care upon discharge. Recommend SNF  Or Alzheimer's unit.   Subjective:   [name] is a 79 y.o. female patient admitted with a report of syncopal episode. She has a history of colon cancer in remission, anxiety, Alzheimer's dementia, orthostasis, and thyroid disease. She lives in her own home and has the assistance of [facility] aides. She is reported to have fallen after reporting that she was feeling lightheaded and dizzy. There is no report of head injury. The patient was said to have lost consciousness and stopped breathing to the point of having her lips turn blue, but there was no loss of pulse. There was no seizure activity and she regained consciousness but had some confusion afterward. Her dementia is being reported as worsening over the past year and her PCP placed her on Seroquel due to worsening auditory or visual hallucinations. She only took it a few times then refused. Her daughter provides the history and sequence of the events as the patient is not able to provide any information.       The daughter states that over the past two months her mother's  mental status and behaviors have been worsening with symptoms of auditory and visual hallucinations.     Her daughter has restarted her on it over the past two nights.       Upon evaluation the patient is combative with nursing staff and resistant to being placed in the bed after toilleting. She is unable to provide any information.  HPI:  HPI Elements:   Location:  In patient [facility]. Quality:  poor. Severity:  severe. Timing:  worsening over the past 2 months. Duration:  past year. Context:  patient has suffered a syncopal episode with loss of conciousness but no loss of pulse has worsening dementia with agitation and prolonged QTc of 488.  Past Psychiatric History: Past Medical History  Diagnosis Date  . Colon cancer     h/o colon CA 2010- colectomy 2010  (Dr. Gaylyn Rong with oncology)  . Anxiety     H/o anxiety, would use as needed diazepam with relief  . Thyroid disease     H/o thyroid nodule with benign bx 2010  . Orthostasis     h/o orthostasis, didn't tolerate midodrine, prev eval by Dr. Anne Fu  . Alzheimer's dementia   . PONV (postoperative nausea and vomiting)     reports that she has never smoked. She has never used smokeless tobacco. She reports that she drinks alcohol. She reports that she does not use illicit drugs. History reviewed. No pertinent  family history.   Living Arrangements: Alone   Abuse/Neglect [facility]) Physical Abuse: Denies Verbal Abuse: Denies Sexual Abuse: Denies Allergies:   Allergies  Allergen Reactions  . Aricept [Donepezil Hcl]     Increased hallucinations   . Erythromycin     REACTION: GI upset  . Sulfonamide Derivatives     REACTION: GI upset    ACT Assessment Complete:  No:   Past Psychiatric History: Diagnosis:  Worsening dementia with agitation  Hospitalizations:  No psych  Outpatient Care:  PCP  Substance Abuse Care:  NA  Self-Mutilation:  NA  Suicidal Attempts:  unknown  Homicidal Behaviors:  no   Violent Behaviors:  Combative due to  dementia   Place of Residence:  Her home Marital Status:  Widowed Employed/Unemployed:   Education:   Family Supports:  Daughter Objective: Blood pressure 116/76, pulse 110, temperature 98.1 F (36.7 C), temperature source Oral, resp. rate 18, height 5\' 7"  (1.702 m), weight 68.2 kg (150 lb 5.7 oz), SpO2 94.00%.Body mass index is 23.54 kg/(m^2). Results for orders placed during the hospital encounter of 08/03/13 (from the past 72 hour(s))  CBC WITH DIFFERENTIAL     Status: Abnormal   Collection Time    08/03/13 12:55 PM      Result Value Range   WBC 5.8  4.0 - 10.5 K/uL   RBC 3.94  3.87 - 5.11 MIL/uL   Hemoglobin 11.2 (*) 12.0 - 15.0 g/dL   HCT 45.4 (*) 09.8 - 11.9 %   MCV 87.6  78.0 - 100.0 fL   MCH 28.4  26.0 - 34.0 pg   MCHC 32.5  30.0 - 36.0 g/dL   RDW 14.7  82.9 - 56.2 %   Platelets 198  150 - 400 K/uL   Neutrophils Relative % 72  43 - 77 %   Neutro Abs 4.1  1.7 - 7.7 K/uL   Lymphocytes Relative 20  12 - 46 %   Lymphs Abs 1.1  0.7 - 4.0 K/uL   Monocytes Relative 7  3 - 12 %   Monocytes Absolute 0.4  0.1 - 1.0 K/uL   Eosinophils Relative 1  0 - 5 %   Eosinophils Absolute 0.1  0.0 - 0.7 K/uL   Basophils Relative 0  0 - 1 %   Basophils Absolute 0.0  0.0 - 0.1 K/uL  COMPREHENSIVE METABOLIC PANEL     Status: Abnormal   Collection Time    08/03/13 12:55 PM      Result Value Range   Sodium 140  135 - 145 mEq/L   Potassium 2.9 (*) 3.5 - 5.1 mEq/L   Chloride 100  96 - 112 mEq/L   CO2 33 (*) 19 - 32 mEq/L   Glucose, Bld 114 (*) 70 - 99 mg/dL   BUN 16  6 - 23 mg/dL   Creatinine, Ser 1.30  0.50 - 1.10 mg/dL   Calcium 8.7  8.4 - 86.5 mg/dL   Total Protein 6.3  6.0 - 8.3 g/dL   Albumin 3.2 (*) 3.5 - 5.2 g/dL   AST 14  0 - 37 U/L   ALT 6  0 - 35 U/L   Alkaline Phosphatase 62  39 - 117 U/L   Total Bilirubin 0.3  0.3 - 1.2 mg/dL   GFR calc non Af Amer 58 (*) >90 mL/min   GFR calc Af Amer 67 (*) >90 mL/min   Comment: (NOTE)     The eGFR has been calculated using the CKD EPI  equation.     This calculation has not been validated in all clinical situations.     eGFR's persistently <90 mL/min signify possible Chronic Kidney     Disease.     Performed at [facility]  POCT I-STAT TROPONIN I     Status: None   Collection Time    08/03/13  1:09 PM      Result Value Range   Troponin i, poc 0.01  0.00 - 0.08 ng/mL   Comment 3            Comment: Due to the release kinetics of cTnI,     a negative result within the first hours     of the onset of symptoms does not rule out     myocardial infarction with certainty.     If myocardial infarction is still suspected,     repeat the test at appropriate intervals.  URINALYSIS, ROUTINE W REFLEX MICROSCOPIC     Status: Abnormal   Collection Time    08/03/13  1:21 PM      Result Value Range   Color, Urine YELLOW  YELLOW   APPearance CLEAR  CLEAR   Specific Gravity, Urine 1.006  1.005 - 1.030   pH 7.0  5.0 - 8.0   Glucose, UA NEGATIVE  NEGATIVE mg/dL   Hgb urine dipstick TRACE (*) NEGATIVE   Bilirubin Urine NEGATIVE  NEGATIVE   Ketones, ur NEGATIVE  NEGATIVE mg/dL   Protein, ur NEGATIVE  NEGATIVE mg/dL   Urobilinogen, UA 0.2  0.0 - 1.0 mg/dL   Nitrite NEGATIVE  NEGATIVE   Leukocytes, UA NEGATIVE  NEGATIVE  URINE MICROSCOPIC-ADD ON     Status: None   Collection Time    08/03/13  1:21 PM      Result Value Range   RBC / HPF 0-2  <3 RBC/hpf  TROPONIN I     Status: None   Collection Time    08/03/13  4:42 PM      Result Value Range   Troponin I <0.30  <0.30 ng/mL   Comment:            Due to the release kinetics of cTnI,     a negative result within the first hours     of the onset of symptoms does not rule out     myocardial infarction with certainty.     If myocardial infarction is still suspected,     repeat the test at appropriate intervals.  TSH     Status: None   Collection Time    08/03/13  4:42 PM      Result Value Range   TSH 1.157  0.350 - 4.500 uIU/mL   Comment: Performed at [facility]  TROPONIN I     Status: None   Collection Time    08/03/13 10:58 PM      Result Value Range   Troponin I <0.30  <0.30 ng/mL   Comment:            Due to the release kinetics of cTnI,     a negative result within the first hours     of the onset of symptoms does not rule out     myocardial infarction with certainty.     If myocardial infarction is still suspected,     repeat the test at appropriate intervals.  TROPONIN I     Status: None   Collection Time    08/04/13  4:15  AM      Result Value Range   Troponin I <0.30  <0.30 ng/mL   Comment:            Due to the release kinetics of cTnI,     a negative result within the first hours     of the onset of symptoms does not rule out     myocardial infarction with certainty.     If myocardial infarction is still suspected,     repeat the test at appropriate intervals.  BASIC METABOLIC PANEL     Status: Abnormal   Collection Time    08/04/13  4:15 AM      Result Value Range   Sodium 141  135 - 145 mEq/L   Potassium 3.5  3.5 - 5.1 mEq/L   Comment: DELTA CHECK NOTED     NO VISIBLE HEMOLYSIS   Chloride 104  96 - 112 mEq/L   CO2 26  19 - 32 mEq/L   Glucose, Bld 91  70 - 99 mg/dL   BUN 15  6 - 23 mg/dL   Creatinine, Ser 4.09  0.50 - 1.10 mg/dL   Calcium 9.1  8.4 - 81.1 mg/dL   GFR calc non Af Amer 63 (*) >90 mL/min   GFR calc Af Amer 74 (*) >90 mL/min   Comment: (NOTE)     The eGFR has been calculated using the CKD EPI equation.     This calculation has not been validated in all clinical situations.     eGFR's persistently <90 mL/min signify possible Chronic Kidney     Disease.  CBC     Status: Abnormal   Collection Time    08/04/13  4:15 AM      Result Value Range   WBC 6.3  4.0 - 10.5 K/uL   RBC 3.95  3.87 - 5.11 MIL/uL   Hemoglobin 10.9 (*) 12.0 - 15.0 g/dL   HCT 91.4 (*) 78.2 - 95.6 %   MCV 88.1  78.0 - 100.0 fL   MCH 27.6  26.0 - 34.0 pg   MCHC 31.3  30.0 - 36.0 g/dL   RDW 21.3  08.6 - 57.8 %   Platelets  209  150 - 400 K/uL   Labs are reviewed and are pertinent for hypokalemia.  Current Facility-Administered Medications  Medication Dose Route Frequency Provider Last Rate Last Dose  . 0.9 %  sodium chloride infusion   Intravenous Continuous Leatha Gilding, MD 75 mL/hr at 08/04/13 1332    . aspirin chewable tablet 81 mg  81 mg Oral Daily Costin Otelia Sergeant, MD      . enoxaparin (LOVENOX) injection 40 mg  40 mg Subcutaneous Q24H Leatha Gilding, MD   40 mg at 08/03/13 2152  . fludrocortisone (FLORINEF) tablet 0.1 mg  0.1 mg Oral Daily Costin Otelia Sergeant, MD       And  . fludrocortisone (FLORINEF) tablet 0.2 mg  0.2 mg Oral q1800 Leatha Gilding, MD   0.2 mg at 08/03/13 1841  . LORazepam (ATIVAN) injection 1 mg  1 mg Intravenous Q4H PRN Leatha Gilding, MD   1 mg at 08/04/13 1332  . sodium chloride 0.9 % injection 3 mL  3 mL Intravenous Q12H Leatha Gilding, MD   3 mL at 08/03/13 2152    Psychiatric Specialty Exam:     Blood pressure 116/76, pulse 110, temperature 98.1 F (36.7 C), temperature source Oral, resp. rate 18, height 5\' 7"  (1.702  m), weight 68.2 kg (150 lb 5.7 oz), SpO2 94.00%.Body mass index is 23.54 kg/(m^2).  General Appearance: Disheveled  Eye Solicitor::  None  Speech:  Garbled  Volume:  Increased  Mood:  Angry and Irritable  Affect:  Non-Congruent  Thought Process:  Disorganized  Orientation:  NA  Thought Content:  NA  Suicidal Thoughts:  No  Homicidal Thoughts:  No  Memory:  NA  Judgement:  NA  Insight:  NA  Psychomotor Activity:  Agitated and combative  Concentration:  NA  Recall:  NA  Akathisia:  No  Handed:    AIMS (if indicated):     Assets:  Social Support  Sleep:      Treatment Plan Summary: 1. Continue Ativan as written for agitation. 2. Repeat EKG when K+ is re-pleated, could re-consider psychotropics if QTc is lower. 3. Recommend pharmacy consult with daughter regarding the use of psychotropics in the elderly and have daughter sign consent      Before using psychotropics. 4. Recommend Social work consult for alzheimer's unit or SNF  For higher level of care. Thank you for allowing [place] to assist you in caring for this nice lady and her daughter. Rona Ravens. [place] RPAC 5:48 PM 08/04/2013  I agreed with the findings, treatment and disposition plan of this patient. Kathryne Sharper, MD

## 2013-08-04 NOTE — Progress Notes (Signed)
Pharmacy - Brief Note  Psychiatry has asked pharmacy to speak to daughter regarding atypical antipsychotic use.  Attempted to go to patient's room x 2 but daughter unavailable (and informed on 2nd visit she left for the day).  Plan to follow-up tomorrow.  32 YOF with worsening agitation, appears was on seroquel but patient began refusing but resumed it x 2 nights prior to admission.  Her QTc is and seroquel has been held and psychiatry was consulted.  [name] called pharmacy asking for pharmacist to speak to daughter re: possible atypical antipsychotic options.    Aripiprazole (abilify) will have the least effects of QTc as well as low anticholingeric, low metabolic, and low EPS risk as compared with other antipsychotics.  It is not as well studied as some of the other meds for agitation associated with dementia in elderly. As with other antipsychotic use in this setting, black box warning exists for  Increased risk of death.  If metabolic and other possible factors for increased agitation ruled out, weighing risks vs benefits, aripiprazole may be potential option, would start with low dose such as 2mg  or 5mg  daily  [name], PharmD, BCPS.   Pager: [phone]  08/04/2013,7:39 PM

## 2013-08-04 NOTE — Progress Notes (Signed)
Pt this am noted to be very restless and oriented to first name only. Pt sitting up at desk with NT for closer monitoring at shift change. NT placed Pt back in room and breakfast ordered. Pt continues to attempt to climb out of the bed "I need to get my pocketbook from upstairs" All safety measures maintained for Pt safety and will monitor closely

## 2013-08-04 NOTE — Progress Notes (Signed)
Echo Tech in room to try to do Echo Pt again very agitated and attempting to climb out of bed. Daughter unable to calm Pt. Pt does not recognize daughter. Echo Tech will attempt later to finish echo.

## 2013-08-04 NOTE — Progress Notes (Signed)
Pt very agitated with Recruitment consultant. Pt unable to reorient and unable to calm Pt. Pt yelling out and crying. MD made aware and new orders given

## 2013-08-04 NOTE — Progress Notes (Signed)
Pt very agitated and upset. Yelling out "Please take me to the porch". Unable to reorient Pt or calm Pt MD on floor and making rounds. MD to see Pt

## 2013-08-05 MED ORDER — POTASSIUM CHLORIDE 20 MEQ/15ML (10%) PO LIQD
40.0000 meq | ORAL | Status: AC
  Administered 2013-08-05 (×2): 40 meq via ORAL
  Filled 2013-08-05 (×2): qty 30

## 2013-08-05 MED ORDER — ENSURE PUDDING PO PUDG
1.0000 | Freq: Two times a day (BID) | ORAL | Status: DC
  Administered 2013-08-05 – 2013-08-07 (×4): 1 via ORAL
  Filled 2013-08-05 (×5): qty 1

## 2013-08-05 NOTE — Progress Notes (Signed)
PHARMACY NOTE:  As requested by psychiatry, met with patient's daughter [name]) and reviewed use of atypical antipsychotic agents for treating severe agitation in elderly patients with dementia.  Patient handout on Abilify was provided (see pharmacist note from 08/04/13), but explained that the major potential adverse effects (including cardiac arrhythmias, as well as an increased risk of death due to cardiovascular events and infection) are common to the entire medication class.    Reviewed the fact that risks and benefits of using these medications in an individual patient are best assessed by the physician in consultation with the patient and family. The daughter expressed understanding and stated appreciation for the information.  Elie Goody, PharmD, BCPS Pager: [phone] 08/05/2013  2:32 PM

## 2013-08-05 NOTE — Progress Notes (Signed)
  Echocardiogram 2D Echocardiogram has been performed.  Arvil Chaco 08/05/2013, 11:18 AM

## 2013-08-05 NOTE — Progress Notes (Signed)
[facility] PROGRESS NOTE      [name] ZOX:[id number] DOB: 09-Sep-1927 DOA: 08/03/2013 PCP: Tillman Abide, MD  Assessment/Plan: Dementia with psychosis and hallucinations - severe 12/6 morning and patient very agitated. I am worried that with her prolonged QTC and recent syncope might not be safe to use QT prolonging agents which include most antipsychotics. Psychiatry consulted 08/04/2013. - Patient improved on 08/05/2013 [facility] consult social work for placement to dementia unit. Family is not sure at this point if he'll be able to continue to take care of her at home. Syncope - patient has a long-standing history of syncopal episodes, however this one was different in the fact that she had a period of apnea.  - Unable to do the 2-D echo yesterday, was done today QT prolongation Hypokalemia - possibly due to Florinef, she was supposed to be on supplements, however patient is not able to tolerate oral potassium because "the pills are too big".  - Continues to need replacement, suspect that she will need liquid potassium supplementation on discharge.  Diet: heart Fluids: NS DVT Prophylaxis: Lovenox  Code Status: Full Family Communication: daughter  Disposition Plan: inpatient   Consultants:  Psychiatry  Procedures:  none   Antibiotics  Anti-infectives   None     Antibiotics Given (last 72 hours)   None      HPI/Subjective: - Continues to be very confused, however calmer this morning.  Objective: Filed Vitals:   08/04/13 0501 08/04/13 0502 08/04/13 1521 08/05/13 0710  BP: 182/76 140/65 116/76 178/72  Pulse: 64 70 110 69  Temp:   98.1 F (36.7 C) 97.8 F (36.6 C)  TempSrc:   Oral Oral  Resp:   18 20  Height:      Weight:      SpO2:   94% 100%    Intake/Output Summary (Last 24 hours) at 08/05/13 1409 Last data filed at 08/05/13 1314  Gross per 24 hour  Intake   1380 ml  Output   2200 ml  Net   -820 ml   Filed Weights   08/03/13 1650   Weight: 68.2 kg (150 lb 5.7 oz)    Exam: Gen: NAD CV: RRR Lungs: CTA biL Abd: Soft, NTTP Neuro: Grossly nonfocal  Data Reviewed: Basic Metabolic Panel:  Recent Labs Lab 08/03/13 1255 08/04/13 0415 08/05/13 0509  NA 140 141  --   K 2.9* 3.5 3.2*  CL 100 104  --   CO2 33* 26  --   GLUCOSE 114* 91  --   BUN 16 15  --   CREATININE 0.90 0.83  --   CALCIUM 8.7 9.1  --    Liver Function Tests:  Recent Labs Lab 08/03/13 1255  AST 14  ALT 6  ALKPHOS 62  BILITOT 0.3  PROT 6.3  ALBUMIN 3.2*   CBC:  Recent Labs Lab 08/03/13 1255 08/04/13 0415  WBC 5.8 6.3  NEUTROABS 4.1  --   HGB 11.2* 10.9*  HCT 34.5* 34.8*  MCV 87.6 88.1  PLT 198 209   Cardiac Enzymes:  Recent Labs Lab 08/03/13 1642 08/03/13 2258 08/04/13 0415  TROPONINI <0.30 <0.30 <0.30   Studies: No results found.  Scheduled Meds: . aspirin  81 mg Oral Daily  . enoxaparin (LOVENOX) injection  40 mg Subcutaneous Q24H  . feeding supplement (ENSURE)  1 Container Oral BID BM  . fludrocortisone  0.1 mg Oral Daily   And  . fludrocortisone  0.2 mg Oral q1800  .  sodium chloride  3 mL Intravenous Q12H   Continuous Infusions: . sodium chloride 75 mL/hr at 08/05/13 1610    Principal Problem:   Syncope Active Problems:   POSTURAL HYPOTENSION   Alzheimer's dementia   Depression   Visual hallucinations  Time spent: 25  Pamella Pert, MD [facility] Pager [phone]. If 7 PM - 7 AM, please contact night-coverage at [email or web address], password [facility] 08/05/2013, 2:09 PM  LOS: 2 days

## 2013-08-05 NOTE — Progress Notes (Addendum)
INITIAL NUTRITION ASSESSMENT  DOCUMENTATION CODES Per approved criteria  -Not Applicable   INTERVENTION: - Ensure Pudding BID, each serving provides 170 kcal, 4 g protein - Encourage intake of meals - Patient's family educated briefly on heart healthy choices from the menu.   NUTRITION DIAGNOSIS: Inadequate oral intake related to decreased appetite as evidenced by <20% intake of meals.   Goal: Patient will meet >/=90% of estimated nutrition needs  Monitor:  PO intake, weight, labs, I/Os  Reason for Assessment: Malnutrition screening tool  79 y.o. female  Admitting Dx: Syncope  ASSESSMENT: Patient with history of colon cancer in 2010 s/p colectomy, with worsening dementia, admitted with syncopal episode. Per daughter, patient not eating well over the last few days, but ate better at breakfast this morning. She had lost about 25-35 pounds over 4 months, but weight is now back up to her usual weight. Poor intake and weight loss likely due to macular degeneration, difficulty seeing prevented her from preparing meals. Daughter has gotten a caretaker who helps prepare foods.   Height: Ht Readings from Last 1 Encounters:  08/03/13 5\' 7"  (1.702 m)    Weight: Wt Readings from Last 1 Encounters:  08/03/13 150 lb 5.7 oz (68.2 kg)    Ideal Body Weight: 135 pounds  % Ideal Body Weight: 111%  Wt Readings from Last 10 Encounters:  08/03/13 150 lb 5.7 oz (68.2 kg)  04/13/13 141 lb (63.957 kg)  03/28/13 137 lb (62.143 kg)  02/09/13 139 lb (63.05 kg)  01/12/13 143 lb 12 oz (65.205 kg)  10/02/12 152 lb (68.947 kg)  07/10/12 150 lb (68.04 kg)  06/23/12 147 lb (66.679 kg)  03/06/12 150 lb (68.04 kg)  02/07/12 149 lb (67.586 kg)    Usual Body Weight: 150 pounds  % Usual Body Weight: 100%  BMI:  Body mass index is 23.54 kg/(m^2). Patient is normal weight  Estimated Nutritional Needs: Kcal: 1400-1550 kcal Protein: 80-95 g Fluid: >2.1 L/day  Skin: Intact  Diet Order:  Cardiac  EDUCATION NEEDS: -Education needs addressed   Intake/Output Summary (Last 24 hours) at 08/05/13 1154 Last data filed at 08/05/13 1149  Gross per 24 hour  Intake   1140 ml  Output   2000 ml  Net   -860 ml    Last BM: PTA   Labs:   Recent Labs Lab 08/03/13 1255 08/04/13 0415 08/05/13 0509  NA 140 141  --   K 2.9* 3.5 3.2*  CL 100 104  --   CO2 33* 26  --   BUN 16 15  --   CREATININE 0.90 0.83  --   CALCIUM 8.7 9.1  --   GLUCOSE 114* 91  --     CBG (last 3)  No results found for this basename: GLUCAP,  in the last 72 hours  Scheduled Meds: . aspirin  81 mg Oral Daily  . enoxaparin (LOVENOX) injection  40 mg Subcutaneous Q24H  . fludrocortisone  0.1 mg Oral Daily   And  . fludrocortisone  0.2 mg Oral q1800  . sodium chloride  3 mL Intravenous Q12H    Continuous Infusions: . sodium chloride 75 mL/hr at 08/05/13 0102    Past Medical History  Diagnosis Date  . Colon cancer     h/o colon CA 2010- colectomy 2010  (Dr. Gaylyn Rong with oncology)  . Anxiety     H/o anxiety, would use as needed diazepam with relief  . Thyroid disease     H/o thyroid nodule with  benign bx 2010  . Orthostasis     h/o orthostasis, didn't tolerate midodrine, prev eval by Dr. Anne Fu  . Alzheimer's dementia   . PONV (postoperative nausea and vomiting)     Past Surgical History  Procedure Laterality Date  . US echocardiography  [id number]    normal  . Abdominal hysterectomy    . Partial colectomy  2010    for colon CA    Linnell Fulling, RD, LDN Pager #: [phone] After-Hours Pager #: [phone]

## 2013-08-06 ENCOUNTER — Telehealth: Payer: Self-pay | Admitting: Internal Medicine

## 2013-08-06 MED ORDER — POTASSIUM CHLORIDE 20 MEQ/15ML (10%) PO LIQD
40.0000 meq | Freq: Once | ORAL | Status: AC
  Administered 2013-08-06: 08:00:00 40 meq via ORAL
  Filled 2013-08-06: qty 30

## 2013-08-06 NOTE — Telephone Encounter (Signed)
Left message Will try to reach her later to get specifics about where she is going, etc

## 2013-08-06 NOTE — Telephone Encounter (Signed)
Patient was taken to [facility] on 08/03/13 after she collapsed.  Patient's daughter called to cancel her f/u appointment with you on 08/07/13.  Patient's daughter said patient will be going to a facility after she's released from the hospital, so she didn't reschedule the appointment.

## 2013-08-06 NOTE — Progress Notes (Signed)
Clinical Social Work Department BRIEF PSYCHOSOCIAL ASSESSMENT 08/06/2013  Patient:  [name], [name]     Account Number:  [id number]     Admit date:  08/03/2013  Clinical Social Worker:  Orpah Greek  Date/Time:  08/06/2013 02:40 PM  Referred by:  Physician  Date Referred:  08/06/2013 Referred for  SNF Placement   Other Referral:   Interview type:  Family Other interview type:   patient's daughter, [name] at bedside    PSYCHOSOCIAL DATA Living Status:  ALONE Admitted from facility:   Level of care:   Primary support name:  [name] (daughter) h#: [phone] c#: [phone] Primary support relationship to patient:  CHILD, ADULT Degree of support available:   good    CURRENT CONCERNS Current Concerns  Post-Acute Placement   Other Concerns:    SOCIAL WORK ASSESSMENT / PLAN CSW received referral that patient will need SNF placement at discharge.   Assessment/plan status:  Information/Referral to [facility] Other assessment/ plan:   Information/referral to community resources:   CSW completed FL2 and faxed information out to [facility] SNFs - called both [facility], per daughters request - but no bed offers made. CSW provided bed offers to daughter & she plans to tour [facility] & let CSW know.    PATIENT'S/FAMILY'S RESPONSE TO PLAN OF CARE: Patient's daughter states that she has private duty caregivers at home, but feels that she would benefit from going to a SNF then possibly straight into a locked memory ALF.       Unice Bailey, LCSW [facility] Clinical Social Worker cell #: [phone]

## 2013-08-06 NOTE — Clinical Social Work Note (Signed)
Psych consult received at [facility].  Pt currently at [facility].  Psych CSW contacted [facility] Psych CSW to ensure consult was received.  Vickii Penna, LCSWA [phone]  Clinical Social Work

## 2013-08-06 NOTE — Progress Notes (Signed)
Clinical Social Work Department CLINICAL SOCIAL WORK PLACEMENT NOTE 08/06/2013  Patient:  [name], [name]  Account Number:  [id number] Admit date:  08/03/2013  Clinical Social Worker:  Orpah Greek  Date/time:  08/06/2013 02:47 PM  Clinical Social Work is seeking post-discharge placement for this patient at the following level of care:   SKILLED NURSING   (*CSW will update this form in Epic as items are completed)   08/06/2013  Patient/family provided with [facility] Department of Clinical Social Work's list of facilities offering this level of care within the geographic area requested by the patient (or if unable, by the patient's family).  08/06/2013  Patient/family informed of their freedom to choose among providers that offer the needed level of care, that participate in Medicare, Medicaid or managed care program needed by the patient, have an available bed and are willing to accept the patient.  08/06/2013  Patient/family informed of [facility]' ownership interest in [facility], as well as of the fact that they are under no obligation to receive care at this facility.  PASARR submitted to EDS on 08/06/2013 PASARR number received from EDS on 08/06/2013  FL2 transmitted to all facilities in geographic area requested by pt/family on  08/06/2013 FL2 transmitted to all facilities within larger geographic area on 08/06/2013  Patient informed that his/her managed care company has contracts with or will negotiate with  certain facilities, including the following:     Patient/family informed of bed offers received:  08/06/2013 Patient chooses bed at  Physician recommends and patient chooses bed at    Patient to be transferred to  on   Patient to be transferred to facility by   The following physician request were entered in Epic:   Additional Comments: Unice Bailey, LCSW [facility] Clinical Social Worker cell #: [phone]

## 2013-08-06 NOTE — Progress Notes (Signed)
[facility] PROGRESS NOTE      [name] ZOX:[id number] DOB: [birth year] DOA: 08/03/2013 PCP: Tillman Abide, MD  Assessment/Plan: Dementia with psychosis and hallucinations - severe 12/6 morning and patient very agitated, improved since. I am worried that with her prolonged QTC and recent syncope might not be safe to use QT prolonging agents which include most antipsychotics. Psychiatry consulted 08/04/2013. Maryclare Labrador consult social work for placement to dementia unit. Family is not sure at this point if he'll be able to continue to take care of her at home. - bed placement pending today Syncope - patient has a long-standing history of syncopal episodes, however this one was different in the fact that she had a period of apnea.  - 2D echo as below. No events on telemetry.  QT prolongation Hypokalemia - possibly due to Florinef, she was supposed to be on supplements, however patient is not able to tolerate oral potassium because "the pills are too big".  - Continues to need replacement, suspect that she will need liquid potassium supplementation on discharge.  Diet: heart Fluids: NS DVT Prophylaxis: Lovenox  Code Status: Full Family Communication: daughter  Disposition Plan: inpatient   Consultants:  Psychiatry  Procedures:  2D echo Study Conclusions  - Left ventricle: The cavity size was normal. There was mild concentric hypertrophy. Systolic function was normal. Wall motion was normal; there were no regional wall motion abnormalities. There was an increased relative contribution of atrial contraction to ventricular filling. Doppler parameters are consistent with abnormal left ventricular relaxation (grade 1 diastolic dysfunction). - Mitral valve: Mild regurgitation. - Right atrium: The atrium was mildly dilated. - Pulmonary arteries: PA peak pressure: 39mm Hg (S).    Antibiotics  Anti-infectives   None     Antibiotics Given (last 72 hours)   None       HPI/Subjective: - confused, calm  Objective: Filed Vitals:   08/05/13 2024 08/05/13 2340 08/06/13 0632 08/06/13 1300  BP: 108/35 156/82 126/61 164/66  Pulse: 78 73 71 69  Temp: 98 F (36.7 C)  98.4 F (36.9 C) 97.3 F (36.3 C)  TempSrc: Oral  Oral Oral  Resp: 20  16 20   Height:      Weight:      SpO2: 98%  98% 98%    Intake/Output Summary (Last 24 hours) at 08/06/13 1748 Last data filed at 08/06/13 1747  Gross per 24 hour  Intake   1560 ml  Output    700 ml  Net    860 ml   Filed Weights   08/03/13 1650  Weight: 68.2 kg (150 lb 5.7 oz)    Exam: Gen: NAD CV: RRR Lungs: CTA biL Abd: Soft, NTTP Neuro: Grossly nonfocal  Data Reviewed: Basic Metabolic Panel:  Recent Labs Lab 08/03/13 1255 08/04/13 0415 08/05/13 0509 08/06/13 0446  NA 140 141  --   --   K 2.9* 3.5 3.2* 3.4*  CL 100 104  --   --   CO2 33* 26  --   --   GLUCOSE 114* 91  --   --   BUN 16 15  --   --   CREATININE 0.90 0.83  --   --   CALCIUM 8.7 9.1  --   --    Liver Function Tests:  Recent Labs Lab 08/03/13 1255  AST 14  ALT 6  ALKPHOS 62  BILITOT 0.3  PROT 6.3  ALBUMIN 3.2*   CBC:  Recent Labs Lab 08/03/13 1255 08/04/13 0415  WBC 5.8 6.3  NEUTROABS 4.1  --   HGB 11.2* 10.9*  HCT 34.5* 34.8*  MCV 87.6 88.1  PLT 198 209   Cardiac Enzymes:  Recent Labs Lab 08/03/13 1642 08/03/13 2258 08/04/13 0415  TROPONINI <0.30 <0.30 <0.30   Studies: No results found.  Scheduled Meds: . aspirin  81 mg Oral Daily  . enoxaparin (LOVENOX) injection  40 mg Subcutaneous Q24H  . feeding supplement (ENSURE)  1 Container Oral BID BM  . fludrocortisone  0.1 mg Oral Daily   And  . fludrocortisone  0.2 mg Oral q1800  . sodium chloride  3 mL Intravenous Q12H   Continuous Infusions:    Principal Problem:   Syncope Active Problems:   POSTURAL HYPOTENSION   Alzheimer's dementia   Depression   Visual hallucinations  Time spent: 25  Pamella Pert, MD [facility] Pager [phone]. If 7 PM - 7 AM, please contact night-coverage at [email or web address], password [facility] 08/06/2013, 5:48 PM  LOS: 3 days

## 2013-08-07 ENCOUNTER — Ambulatory Visit: Payer: Medicare Other | Admitting: Internal Medicine

## 2013-08-07 DIAGNOSIS — R55 Syncope and collapse: Secondary | ICD-10-CM

## 2013-08-07 DIAGNOSIS — H5316 Psychophysical visual disturbances: Secondary | ICD-10-CM

## 2013-08-07 MED ORDER — POTASSIUM CHLORIDE 20 MEQ/15ML (10%) PO LIQD: 30.0000 meq | Freq: Every day | ORAL | Status: DC

## 2013-08-07 MED ORDER — LORAZEPAM 1 MG PO TABS
1.0000 mg | ORAL_TABLET | Freq: Once | ORAL | Status: AC
  Administered 2013-08-07: 16:00:00 1 mg via ORAL
  Filled 2013-08-07: qty 1

## 2013-08-07 MED ORDER — HYDRALAZINE HCL 20 MG/ML IJ SOLN
5.0000 mg | Freq: Once | INTRAMUSCULAR | Status: AC
  Administered 2013-08-07: 06:00:00 5 mg via INTRAVENOUS
  Filled 2013-08-07: qty 1

## 2013-08-07 MED ORDER — DIAZEPAM 2 MG PO TABS: 2.0000 mg | ORAL_TABLET | Freq: Four times a day (QID) | ORAL | Status: DC | PRN

## 2013-08-07 MED ORDER — POTASSIUM CHLORIDE 20 MEQ/15ML (10%) PO LIQD
40.0000 meq | ORAL | Status: AC
  Administered 2013-08-07 (×2): 40 meq via ORAL
  Filled 2013-08-07 (×2): qty 30

## 2013-08-07 NOTE — Progress Notes (Signed)
Called [facility] and gave report to nurse. Patient is dressed and ready to go.  Ernesta Amble, RN

## 2013-08-07 NOTE — Progress Notes (Signed)
Patient's daughter called CSW to confirm SNF decision is [facility]. Patient is set to discharge today. Discharge packet in [facility] - RN, Susie aware. [facility] called for transport.   Clinical Social Work Department CLINICAL SOCIAL WORK PLACEMENT NOTE 08/07/2013  Patient:  [name], [name]  Account Number:  [id number] Admit date:  08/03/2013  Clinical Social Worker:  Orpah Greek  Date/time:  08/06/2013 02:47 PM  Clinical Social Work is seeking post-discharge placement for this patient at the following level of care:   SKILLED NURSING   (*CSW will update this form in Epic as items are completed)   08/06/2013  Patient/family provided with [facility] Department of Clinical Social Work's list of facilities offering this level of care within the geographic area requested by the patient (or if unable, by the patient's family).  08/06/2013  Patient/family informed of their freedom to choose among providers that offer the needed level of care, that participate in Medicare, Medicaid or managed care program needed by the patient, have an available bed and are willing to accept the patient.  08/06/2013  Patient/family informed of [facility]' ownership interest in [facility], as well as of the fact that they are under no obligation to receive care at this facility.  PASARR submitted to EDS on 08/06/2013 PASARR number received from EDS on 08/06/2013  FL2 transmitted to all facilities in geographic area requested by pt/family on  08/06/2013 FL2 transmitted to all facilities within larger geographic area on 08/06/2013  Patient informed that his/her managed care company has contracts with or will negotiate with  certain facilities, including the following:     Patient/family informed of bed offers received:  08/06/2013 Patient [facility] at [facility] Physician recommends and patient chooses bed at    Patient to be transferred [facility] on  08/07/2013 Patient to be transferred to facility by [facility]  The following physician request were entered in Epic:   Additional Comments:   Unice Bailey, LCSW [facility] Clinical Social Worker cell #: [phone]

## 2013-08-07 NOTE — Progress Notes (Signed)
Pt's manual BP 200/80.  Pt in NAD, with no complaints.  Notified NP on call, K. Craige Cotta.  Received orders to give one time dose of Hydralazine 5mg  IV.  Will continue to monitor pt.

## 2013-08-07 NOTE — Discharge Summary (Signed)
Physician Discharge Summary  [name] ZOX:[id number] DOB: Dec 19, 1927 DOA: 08/03/2013  PCP: Tillman Abide, MD  Admit date: 08/03/2013 Discharge date: 08/07/2013  Time spent: 35 minutes  Recommendations for Outpatient Follow-up:  1. Follow up with PCP in 1 week   Recommendations for primary care physician for things to follow:  Repeat BMP for K check Monitor BP  Discharge Diagnoses:  Principal Problem:   Syncope Active Problems:   POSTURAL HYPOTENSION   Alzheimer's dementia   Depression   Visual hallucinations  Discharge Condition: stable  Diet recommendation: regular  Filed Weights   08/03/13 1650  Weight: 68.2 kg (150 lb 5.7 oz)   History of present illness:  [name] is a 79 y.o. female has a past medical history significant for colon cancer 4 years ago status post colectomy in 2010, in remission, anxiety, Alzheimer dementia, recurrent syncopal episode on home Florinef, presents to the emergency room with a syncopal episode. Patient can contribute some to the story however her daughter provides most of the information. She was at home today with one of the her aids, when patient fell lightheaded and dizzy, and then passed out did not fall or hit her head. The aid states that she stopped breathing for several minutes and her lips turned blue. There was no reported loss of pulse. She has her blood pressure checked, and it was "very low". There is no reported seizure-like activity, however patient was very confused after this episode. She has on and off for visual hallucinations, and her dementia has been worsening in the past year. Her daughter feels like hallucinations were more prominent this week. She was started on low-dose Seroquel in August, however she took couple of doses at that time and then the patient refused. Recently in the past week her daughter started trying to give her back her Seroquel and she has been taking that for the past few days except for last  night. There are no reports of fever, chills, chest pain, palpitations. Patient denies any symptoms currently.  Hospital Course:  Dementia with psychosis and hallucinations - severe 12/6 morning and patient very agitated, improved since. I am worried that with her prolonged QTC and recent syncope might not be safe to use QT prolonging agents which include most antipsychotics. Psychiatry also evaluated patient while hospitalized. After discussions with psychiatry and pharmacy, Abilify could potentially be used as it will have the lest effect on QTc when compared to the other antipsychotics. Since her symptoms have improved while hospitalized I did not start this medication here, but this can be further investigated if clinically indicated.  Syncope - patient has a long-standing history of syncopal episodes, however this one was different in the fact that she had a period of apnea. 2D echo as below. No events on telemetry. Discontinue Seroquel as it may cause arrhythmias especially when associated with hypokalemia.  QT prolongation - QTC 488 Hypokalemia - possibly due to Florinef, she was supposed to be on supplements, however patient is not able to tolerate oral potassium because "the pills are too big". Tolerating liquid potassium. Would recommend that she follows up with her PCP in few days up to a week to have her K levels re-checked. Potassium has been repleted the day of discharge and she will be given a potassium prescription.  Procedures:  2D echo Study Conclusions - Left ventricle: The cavity size was normal. There was mild concentric hypertrophy. Systolic function was normal. Wall motion was normal; there were no regional  wall motion abnormalities. There was an increased relative contribution of atrial contraction to ventricular filling. Doppler parameters are consistent with abnormal left ventricular relaxation (grade 1 diastolic dysfunction). - Mitral valve: Mild regurgitation. - Right atrium:  The atrium was mildly dilated. - Pulmonary arteries: PA peak pressure: 39mm Hg (S).  Impressions: - The right ventricular systolic pressure was increased consistent with mild pulmonary hypertension.  Consultations:  None   Discharge Exam: Filed Vitals:   08/06/13 2029 08/07/13 0451 08/07/13 0510 08/07/13 0657  BP: 147/76 173/83 200/80 150/74  Pulse: 76 69    Temp: 98.2 F (36.8 C) 98.4 F (36.9 C)    TempSrc: Oral Oral    Resp: 20 20    Height:      Weight:      SpO2: 97% 98%     General: NAD, consfused Cardiovascular: RRR Respiratory: CTA biL  Discharge Instructions   Future Appointments Provider Department Dept Phone   09/06/2013 8:00 AM Sherrie George, MD [facility] [phone]       Medication List    STOP taking these medications       QUEtiapine 25 MG tablet  Commonly known as:  SEROQUEL      TAKE these medications       aspirin 81 MG tablet  Take 81 mg by mouth daily.     diazepam 2 MG tablet  Commonly known as:  VALIUM  Take 2 mg by mouth every 6 (six) hours as needed for anxiety.     fludrocortisone 0.1 MG tablet  Commonly known as:  FLORINEF  Take 0.1-0.2 mg by mouth daily. 1 tablet in the morning and 2 tablets in the evening     potassium chloride 20 MEQ/15ML (10%) solution  Take 22.5 mLs (30 mEq total) by mouth daily.           Follow-up Information   Follow up with Tillman Abide, MD. Schedule an appointment as soon as possible for a visit in 1 week.   Specialties:  Internal Medicine, Pediatrics   Contact information:   [address] [place] [address] [phone]      The results of significant diagnostics from this hospitalization (including imaging, microbiology, ancillary and laboratory) are listed below for reference.    Significant Diagnostic Studies: Dg Chest 2 View  08/03/2013   CLINICAL DATA:  Loss of consciousness  CHEST  2 VIEW  COMPARISON:  05/17/2011  FINDINGS: Cardiomediastinal  silhouette is stable. Mild hyperinflation. No acute infiltrate or pulmonary edema. Minimal degenerative changes thoracic spine.  IMPRESSION: No active disease.  Mild hyperinflation.   Electronically Signed   By: Natasha Mead M.D.   On: 08/03/2013 13:44   Ct Head Wo Contrast  08/03/2013   CLINICAL DATA:  Loss of consciousness.  EXAM: CT HEAD WITHOUT CONTRAST  TECHNIQUE: Contiguous axial images were obtained from the base of the skull through the vertex without intravenous contrast.  COMPARISON:  CT scan of February 01, 2011.  FINDINGS: Bony calvarium appears intact. Diffuse cortical atrophy is noted. Chronic ischemic white matter disease is noted. No mass effect or midline shift is noted. Ventricular size is within normal limits. There is no evidence of mass lesion, hemorrhage or acute infarction.  IMPRESSION: Diffuse cortical atrophy. Chronic ischemic white matter disease. No acute intracranial abnormality seen.   Electronically Signed   By: Roque Lias M.D.   On: 08/03/2013 12:42   Labs: Basic Metabolic Panel:  Recent Labs Lab 08/03/13  1255 08/04/13 0415 08/05/13 0509 08/06/13 0446 08/07/13 0945  NA 140 141  --   --   --   K 2.9* 3.5 3.2* 3.4* 2.9*  CL 100 104  --   --   --   CO2 33* 26  --   --   --   GLUCOSE 114* 91  --   --   --   BUN 16 15  --   --   --   CREATININE 0.90 0.83  --   --   --   CALCIUM 8.7 9.1  --   --   --    Liver Function Tests:  Recent Labs Lab 08/03/13 1255  AST 14  ALT 6  ALKPHOS 62  BILITOT 0.3  PROT 6.3  ALBUMIN 3.2*   CBC:  Recent Labs Lab 08/03/13 1255 08/04/13 0415  WBC 5.8 6.3  NEUTROABS 4.1  --   HGB 11.2* 10.9*  HCT 34.5* 34.8*  MCV 87.6 88.1  PLT 198 209   Cardiac Enzymes:  Recent Labs Lab 08/03/13 1642 08/03/13 2258 08/04/13 0415  TROPONINI <0.30 <0.30 <0.30    Signed:  Pamella Pert  [facility] 08/07/2013, 11:00 AM

## 2013-08-31 ENCOUNTER — Telehealth: Payer: Self-pay

## 2013-08-31 NOTE — Telephone Encounter (Signed)
The Rx for Ativan and orders have been faxed to [facility]

## 2013-08-31 NOTE — Telephone Encounter (Signed)
[name] left v/m requesting cb for pt needing prescription for medications; I called [name] at [phone] and cannot get anyone to answer phone and am not sure what extension to choose because [name] did not leave ext on v/m. [name] said faxed request had come in from [name].Please advise.

## 2013-09-05 ENCOUNTER — Telehealth: Payer: Self-pay

## 2013-09-05 NOTE — Telephone Encounter (Signed)
Amy PT with [facility] left v/m requesting verbal order for home health PT.Please advise.

## 2013-09-06 ENCOUNTER — Encounter (INDEPENDENT_AMBULATORY_CARE_PROVIDER_SITE_OTHER): Payer: 59 | Admitting: Ophthalmology

## 2013-09-06 ENCOUNTER — Other Ambulatory Visit: Payer: Self-pay | Admitting: *Deleted

## 2013-09-06 MED ORDER — LORAZEPAM 0.5 MG PO TABS: 0.5000 mg | ORAL_TABLET | Freq: Three times a day (TID) | ORAL | Status: DC | PRN

## 2013-09-06 NOTE — Telephone Encounter (Signed)
Axanna with [facility] left v/m request response to clarification of diazepam and ativan. refaxed form under media tab that addressed this to [phone].

## 2013-09-06 NOTE — Addendum Note (Signed)
Addended by: Despina Hidden on: 09/06/2013 02:51 PM   Modules accepted: Orders, Medications

## 2013-09-06 NOTE — Telephone Encounter (Signed)
Faxed hard copy of rx to [facility], directions are correct.

## 2013-09-06 NOTE — Addendum Note (Signed)
Addended by: Helene Shoe on: 09/06/2013 02:07 PM   Modules accepted: Orders

## 2013-09-06 NOTE — Telephone Encounter (Signed)
Left message and advised results

## 2013-09-06 NOTE — Telephone Encounter (Addendum)
[name] left v/m requesting Ativan rx; Ativan rx that was previously faxed was  For Ativan 1 mg; instructions take 1 tab q8h prn. Sending request to Dr Silvio Pate request from [name] is for Lorazepam 0.5 mg po tid prn anxiety/sleep disturbance. Lorazepam 0.5 mg added to pts med list and diazepam discontinued per order form from [employer]) on 09/03/13. In meds and orders please note quantity and refills pt should have.

## 2013-09-06 NOTE — Telephone Encounter (Signed)
Other orders addended Please fax again

## 2013-09-06 NOTE — Telephone Encounter (Signed)
Okay for them to evaluate and treat if appropriate

## 2013-09-10 ENCOUNTER — Telehealth: Payer: Self-pay

## 2013-09-10 NOTE — Telephone Encounter (Signed)
Roxana with [facility] left v/m that pt told her pt fell last night but did not tell anyone; this AM pt complains with rt knee pain, slight swelling, pt is able to walk but Roxana request faxed order for tylenol to [phone]. Roxana said that needs specific instructions for tylenol included in fax order.

## 2013-09-10 NOTE — Telephone Encounter (Signed)
Order faxed over to [facility]

## 2013-09-13 ENCOUNTER — Encounter (INDEPENDENT_AMBULATORY_CARE_PROVIDER_SITE_OTHER): Payer: Medicare Other | Admitting: Ophthalmology

## 2013-09-13 DIAGNOSIS — H353290 Exudative age-related macular degeneration, unspecified eye, stage unspecified: Secondary | ICD-10-CM

## 2013-09-13 DIAGNOSIS — H43819 Vitreous degeneration, unspecified eye: Secondary | ICD-10-CM

## 2013-09-13 DIAGNOSIS — H3530 Unspecified macular degeneration: Secondary | ICD-10-CM

## 2013-09-25 ENCOUNTER — Telehealth: Payer: Self-pay | Admitting: Internal Medicine

## 2013-09-25 NOTE — Telephone Encounter (Signed)
Let her know the patient has known orthostatic hypotension Make sure they are giving the full dose of the fludrocortisone If BP low, she should sit for a while and if she feels better, they can resume the therapy

## 2013-09-25 NOTE — Telephone Encounter (Signed)
[name] from [facility] Pt's b/p is 70/42. Pt is very dizzy when standing and moving around. She wanted to let Dr. Silvio Pate know. Please advise  If Dr. Silvio Pate had any questions for [name] please call [phone]. She says she will have to have an order if anything needs to be done or changed.

## 2013-09-25 NOTE — Telephone Encounter (Signed)
Spoke with [employer] and advised results, she will call back if any questions.

## 2013-10-25 ENCOUNTER — Emergency Department: Payer: Self-pay | Admitting: Emergency Medicine

## 2013-10-27 ENCOUNTER — Observation Stay: Payer: Self-pay | Admitting: Internal Medicine

## 2013-10-27 LAB — CBC
HCT: 32.5 % — AB (ref 35.0–47.0)
HGB: 10.4 g/dL — ABNORMAL LOW (ref 12.0–16.0)
MCH: 28.1 pg (ref 26.0–34.0)
MCHC: 32.1 g/dL (ref 32.0–36.0)
MCV: 88 fL (ref 80–100)
Platelet: 226 10*3/uL (ref 150–440)
RBC: 3.71 10*6/uL — ABNORMAL LOW (ref 3.80–5.20)
RDW: 15.5 % — ABNORMAL HIGH (ref 11.5–14.5)
WBC: 15.2 10*3/uL — AB (ref 3.6–11.0)

## 2013-10-27 LAB — URINALYSIS, COMPLETE
BILIRUBIN, UR: NEGATIVE
Blood: NEGATIVE
Hyaline Cast: 25
Ketone: NEGATIVE
NITRITE: NEGATIVE
Ph: 5 (ref 4.5–8.0)
Protein: 30
SPECIFIC GRAVITY: 1.021 (ref 1.003–1.030)
WBC UR: 8 /HPF (ref 0–5)

## 2013-10-27 LAB — COMPREHENSIVE METABOLIC PANEL
ALT: 13 U/L (ref 12–78)
ANION GAP: 3 — AB (ref 7–16)
AST: 16 U/L (ref 15–37)
Albumin: 3.3 g/dL — ABNORMAL LOW (ref 3.4–5.0)
Alkaline Phosphatase: 63 U/L
BILIRUBIN TOTAL: 0.4 mg/dL (ref 0.2–1.0)
BUN: 27 mg/dL — AB (ref 7–18)
CO2: 29 mmol/L (ref 21–32)
Calcium, Total: 8.3 mg/dL — ABNORMAL LOW (ref 8.5–10.1)
Chloride: 106 mmol/L (ref 98–107)
Creatinine: 1.39 mg/dL — ABNORMAL HIGH (ref 0.60–1.30)
EGFR (Non-African Amer.): 35 — ABNORMAL LOW
GFR CALC AF AMER: 41 — AB
Glucose: 133 mg/dL — ABNORMAL HIGH (ref 65–99)
OSMOLALITY: 283 (ref 275–301)
POTASSIUM: 4.3 mmol/L (ref 3.5–5.1)
SODIUM: 138 mmol/L (ref 136–145)
Total Protein: 6.3 g/dL — ABNORMAL LOW (ref 6.4–8.2)

## 2013-10-27 LAB — TROPONIN I: Troponin-I: <0.02 ng/mL

## 2013-10-28 LAB — CBC WITH DIFFERENTIAL/PLATELET
Basophil #: 0 10*3/uL (ref 0.0–0.1)
Basophil %: 0.3 %
EOS ABS: 0 10*3/uL (ref 0.0–0.7)
Eosinophil %: 0.2 %
HCT: 28.3 % — AB (ref 35.0–47.0)
HGB: 9.1 g/dL — AB (ref 12.0–16.0)
Lymphocyte #: 1.9 10*3/uL (ref 1.0–3.6)
Lymphocyte %: 19.4 %
MCH: 28.4 pg (ref 26.0–34.0)
MCHC: 32.3 g/dL (ref 32.0–36.0)
MCV: 88 fL (ref 80–100)
MONO ABS: 0.8 x10 3/mm (ref 0.2–0.9)
Monocyte %: 7.9 %
NEUTROS ABS: 7 10*3/uL — AB (ref 1.4–6.5)
NEUTROS PCT: 72.2 %
Platelet: 193 10*3/uL (ref 150–440)
RBC: 3.22 10*6/uL — ABNORMAL LOW (ref 3.80–5.20)
RDW: 15.8 % — AB (ref 11.5–14.5)
WBC: 9.7 10*3/uL (ref 3.6–11.0)

## 2013-10-28 LAB — BASIC METABOLIC PANEL
ANION GAP: 5 — AB (ref 7–16)
BUN: 30 mg/dL — ABNORMAL HIGH (ref 7–18)
CO2: 27 mmol/L (ref 21–32)
Calcium, Total: 7.9 mg/dL — ABNORMAL LOW (ref 8.5–10.1)
Chloride: 109 mmol/L — ABNORMAL HIGH (ref 98–107)
Creatinine: 1.31 mg/dL — ABNORMAL HIGH (ref 0.60–1.30)
EGFR (Non-African Amer.): 38 — ABNORMAL LOW
GFR CALC AF AMER: 44 — AB
Glucose: 87 mg/dL (ref 65–99)
Osmolality: 287 (ref 275–301)
Potassium: 4 mmol/L (ref 3.5–5.1)
SODIUM: 141 mmol/L (ref 136–145)

## 2013-10-29 LAB — BASIC METABOLIC PANEL
ANION GAP: 4 — AB (ref 7–16)
BUN: 20 mg/dL — ABNORMAL HIGH (ref 7–18)
CALCIUM: 7.9 mg/dL — AB (ref 8.5–10.1)
CHLORIDE: 110 mmol/L — AB (ref 98–107)
CO2: 26 mmol/L (ref 21–32)
CREATININE: 1.02 mg/dL (ref 0.60–1.30)
EGFR (Non-African Amer.): 51 — ABNORMAL LOW
GFR CALC AF AMER: 59 — AB
GLUCOSE: 104 mg/dL — AB (ref 65–99)
Osmolality: 282 (ref 275–301)
Potassium: 3.6 mmol/L (ref 3.5–5.1)
Sodium: 140 mmol/L (ref 136–145)

## 2013-10-29 LAB — URINE CULTURE

## 2013-11-07 ENCOUNTER — Encounter (INDEPENDENT_AMBULATORY_CARE_PROVIDER_SITE_OTHER): Payer: Medicare Other | Admitting: Ophthalmology

## 2013-11-14 ENCOUNTER — Encounter (INDEPENDENT_AMBULATORY_CARE_PROVIDER_SITE_OTHER): Payer: Medicare Other | Admitting: Ophthalmology

## 2013-12-17 ENCOUNTER — Encounter (INDEPENDENT_AMBULATORY_CARE_PROVIDER_SITE_OTHER): Payer: Medicare Other | Admitting: Ophthalmology

## 2013-12-19 ENCOUNTER — Encounter (INDEPENDENT_AMBULATORY_CARE_PROVIDER_SITE_OTHER): Payer: Medicare Other | Admitting: Ophthalmology

## 2013-12-19 DIAGNOSIS — H353290 Exudative age-related macular degeneration, unspecified eye, stage unspecified: Secondary | ICD-10-CM

## 2014-01-02 ENCOUNTER — Ambulatory Visit (INDEPENDENT_AMBULATORY_CARE_PROVIDER_SITE_OTHER): Payer: Medicare Other | Admitting: Ophthalmology

## 2014-01-02 DIAGNOSIS — H43819 Vitreous degeneration, unspecified eye: Secondary | ICD-10-CM

## 2014-01-02 DIAGNOSIS — H3530 Unspecified macular degeneration: Secondary | ICD-10-CM

## 2014-02-25 ENCOUNTER — Encounter (INDEPENDENT_AMBULATORY_CARE_PROVIDER_SITE_OTHER): Payer: Medicare Other | Admitting: Ophthalmology

## 2014-02-25 DIAGNOSIS — H3530 Unspecified macular degeneration: Secondary | ICD-10-CM

## 2014-02-25 DIAGNOSIS — H43819 Vitreous degeneration, unspecified eye: Secondary | ICD-10-CM

## 2014-02-27 ENCOUNTER — Encounter (INDEPENDENT_AMBULATORY_CARE_PROVIDER_SITE_OTHER): Payer: Medicare Other | Admitting: Ophthalmology

## 2014-04-15 ENCOUNTER — Encounter (INDEPENDENT_AMBULATORY_CARE_PROVIDER_SITE_OTHER): Payer: Medicare Other | Admitting: Ophthalmology

## 2014-04-15 DIAGNOSIS — H43819 Vitreous degeneration, unspecified eye: Secondary | ICD-10-CM

## 2014-04-15 DIAGNOSIS — H3530 Unspecified macular degeneration: Secondary | ICD-10-CM

## 2014-04-17 ENCOUNTER — Encounter (INDEPENDENT_AMBULATORY_CARE_PROVIDER_SITE_OTHER): Payer: Medicare Other | Admitting: Ophthalmology

## 2014-04-22 ENCOUNTER — Encounter (INDEPENDENT_AMBULATORY_CARE_PROVIDER_SITE_OTHER): Payer: Medicare Other | Admitting: Ophthalmology

## 2014-05-16 ENCOUNTER — Emergency Department: Payer: Self-pay | Admitting: Emergency Medicine

## 2014-05-16 LAB — CBC WITH DIFFERENTIAL/PLATELET
Basophil #: 0 10*3/uL (ref 0.0–0.1)
Basophil %: 0.6 %
EOS ABS: 0.2 10*3/uL (ref 0.0–0.7)
EOS PCT: 3.6 %
HCT: 34.3 % — ABNORMAL LOW (ref 35.0–47.0)
HGB: 11.1 g/dL — AB (ref 12.0–16.0)
LYMPHS ABS: 2.2 10*3/uL (ref 1.0–3.6)
LYMPHS PCT: 34 %
MCH: 28.7 pg (ref 26.0–34.0)
MCHC: 32.4 g/dL (ref 32.0–36.0)
MCV: 89 fL (ref 80–100)
MONO ABS: 0.5 x10 3/mm (ref 0.2–0.9)
MONOS PCT: 7.4 %
Neutrophil #: 3.5 10*3/uL (ref 1.4–6.5)
Neutrophil %: 54.4 %
Platelet: 188 10*3/uL (ref 150–440)
RBC: 3.86 10*6/uL (ref 3.80–5.20)
RDW: 15.8 % — ABNORMAL HIGH (ref 11.5–14.5)
WBC: 6.4 10*3/uL (ref 3.6–11.0)

## 2014-05-16 LAB — COMPREHENSIVE METABOLIC PANEL
Albumin: 3 g/dL — ABNORMAL LOW (ref 3.4–5.0)
Alkaline Phosphatase: 78 U/L
Anion Gap: 8 (ref 7–16)
BUN: 24 mg/dL — AB (ref 7–18)
Bilirubin,Total: 0.3 mg/dL (ref 0.2–1.0)
CREATININE: 1.07 mg/dL (ref 0.60–1.30)
Calcium, Total: 8.4 mg/dL — ABNORMAL LOW (ref 8.5–10.1)
Chloride: 107 mmol/L (ref 98–107)
Co2: 27 mmol/L (ref 21–32)
GFR CALC AF AMER: 55 — AB
GFR CALC NON AF AMER: 48 — AB
GLUCOSE: 88 mg/dL (ref 65–99)
Osmolality: 287 (ref 275–301)
Potassium: 4.2 mmol/L (ref 3.5–5.1)
SGOT(AST): 21 U/L (ref 15–37)
SGPT (ALT): 14 U/L
Sodium: 142 mmol/L (ref 136–145)
Total Protein: 6.6 g/dL (ref 6.4–8.2)

## 2014-05-16 LAB — URINALYSIS, COMPLETE
Bacteria: NONE SEEN
Bilirubin,UR: NEGATIVE
Glucose,UR: NEGATIVE mg/dL (ref 0–75)
Ketone: NEGATIVE
Leukocyte Esterase: NEGATIVE
NITRITE: NEGATIVE
Ph: 5 (ref 4.5–8.0)
Protein: NEGATIVE
Specific Gravity: 1.017 (ref 1.003–1.030)

## 2014-05-16 LAB — VALPROIC ACID LEVEL: VALPROIC ACID: 30 ug/mL — AB

## 2014-05-16 LAB — CK TOTAL AND CKMB (NOT AT ARMC)
CK, TOTAL: 47 U/L
CK-MB: 0.8 ng/mL (ref 0.5–3.6)

## 2014-05-16 LAB — TROPONIN I

## 2014-05-28 ENCOUNTER — Emergency Department: Payer: Self-pay | Admitting: Emergency Medicine

## 2014-06-10 ENCOUNTER — Encounter (INDEPENDENT_AMBULATORY_CARE_PROVIDER_SITE_OTHER): Payer: Medicare Other | Admitting: Ophthalmology

## 2014-06-12 ENCOUNTER — Encounter (INDEPENDENT_AMBULATORY_CARE_PROVIDER_SITE_OTHER): Payer: Medicare Other | Admitting: Ophthalmology

## 2014-06-14 ENCOUNTER — Other Ambulatory Visit: Payer: Self-pay

## 2014-06-18 ENCOUNTER — Emergency Department: Payer: Self-pay | Admitting: Emergency Medicine

## 2014-06-18 LAB — COMPREHENSIVE METABOLIC PANEL
ANION GAP: 6 — AB (ref 7–16)
Albumin: 3.3 g/dL — ABNORMAL LOW (ref 3.4–5.0)
Alkaline Phosphatase: 91 U/L
BUN: 34 mg/dL — AB (ref 7–18)
Bilirubin,Total: 0.2 mg/dL (ref 0.2–1.0)
CALCIUM: 8.2 mg/dL — AB (ref 8.5–10.1)
CO2: 29 mmol/L (ref 21–32)
Chloride: 105 mmol/L (ref 98–107)
Creatinine: 1.49 mg/dL — ABNORMAL HIGH (ref 0.60–1.30)
EGFR (African American): 43 — ABNORMAL LOW
EGFR (Non-African Amer.): 35 — ABNORMAL LOW
Glucose: 99 mg/dL (ref 65–99)
OSMOLALITY: 287 (ref 275–301)
Potassium: 4.7 mmol/L (ref 3.5–5.1)
SGOT(AST): 15 U/L (ref 15–37)
SGPT (ALT): 17 U/L
SODIUM: 140 mmol/L (ref 136–145)
Total Protein: 6.9 g/dL (ref 6.4–8.2)

## 2014-06-18 LAB — CBC
HCT: 37.5 % (ref 35.0–47.0)
HGB: 11.4 g/dL — ABNORMAL LOW (ref 12.0–16.0)
MCH: 27.4 pg (ref 26.0–34.0)
MCHC: 30.5 g/dL — ABNORMAL LOW (ref 32.0–36.0)
MCV: 90 fL (ref 80–100)
PLATELETS: 198 10*3/uL (ref 150–440)
RBC: 4.16 10*6/uL (ref 3.80–5.20)
RDW: 14.9 % — ABNORMAL HIGH (ref 11.5–14.5)
WBC: 7.5 10*3/uL (ref 3.6–11.0)

## 2014-06-18 LAB — TROPONIN I: Troponin-I: <0.02 ng/mL

## 2014-06-19 ENCOUNTER — Encounter (INDEPENDENT_AMBULATORY_CARE_PROVIDER_SITE_OTHER): Payer: Medicare Other | Admitting: Ophthalmology

## 2014-06-28 ENCOUNTER — Encounter (INDEPENDENT_AMBULATORY_CARE_PROVIDER_SITE_OTHER): Payer: Medicare Other | Admitting: Ophthalmology

## 2014-07-05 ENCOUNTER — Encounter (INDEPENDENT_AMBULATORY_CARE_PROVIDER_SITE_OTHER): Payer: Medicare Other | Admitting: Ophthalmology

## 2014-07-05 DIAGNOSIS — H43813 Vitreous degeneration, bilateral: Secondary | ICD-10-CM

## 2014-07-05 DIAGNOSIS — H3531 Nonexudative age-related macular degeneration: Secondary | ICD-10-CM

## 2014-08-02 ENCOUNTER — Emergency Department: Payer: Self-pay | Admitting: Emergency Medicine

## 2014-08-02 LAB — CBC
HCT: 35.7 % (ref 35.0–47.0)
HGB: 11.3 g/dL — ABNORMAL LOW (ref 12.0–16.0)
MCH: 28.5 pg (ref 26.0–34.0)
MCHC: 31.6 g/dL — ABNORMAL LOW (ref 32.0–36.0)
MCV: 90 fL (ref 80–100)
PLATELETS: 194 10*3/uL (ref 150–440)
RBC: 3.95 10*6/uL (ref 3.80–5.20)
RDW: 15 % — AB (ref 11.5–14.5)
WBC: 7.6 10*3/uL (ref 3.6–11.0)

## 2014-08-02 LAB — URINALYSIS, COMPLETE
Bacteria: NONE SEEN
Bilirubin,UR: NEGATIVE
Blood: NEGATIVE
Glucose,UR: NEGATIVE mg/dL (ref 0–75)
Hyaline Cast: 20
LEUKOCYTE ESTERASE: NEGATIVE
NITRITE: NEGATIVE
Ph: 5 (ref 4.5–8.0)
Protein: 30
Specific Gravity: 1.025 (ref 1.003–1.030)
WBC UR: 1 /HPF (ref 0–5)

## 2014-08-02 LAB — BASIC METABOLIC PANEL
ANION GAP: 8 (ref 7–16)
BUN: 26 mg/dL — AB (ref 7–18)
CALCIUM: 8.4 mg/dL — AB (ref 8.5–10.1)
CO2: 30 mmol/L (ref 21–32)
CREATININE: 1.31 mg/dL — AB (ref 0.60–1.30)
Chloride: 103 mmol/L (ref 98–107)
GFR CALC AF AMER: 50 — AB
GFR CALC NON AF AMER: 41 — AB
GLUCOSE: 70 mg/dL (ref 65–99)
OSMOLALITY: 284 (ref 275–301)
POTASSIUM: 4.5 mmol/L (ref 3.5–5.1)
SODIUM: 141 mmol/L (ref 136–145)

## 2014-08-02 LAB — TROPONIN I

## 2014-08-13 ENCOUNTER — Emergency Department: Payer: Self-pay | Admitting: Emergency Medicine

## 2014-09-14 ENCOUNTER — Emergency Department: Payer: Self-pay | Admitting: Emergency Medicine

## 2014-10-04 ENCOUNTER — Encounter (INDEPENDENT_AMBULATORY_CARE_PROVIDER_SITE_OTHER): Payer: Medicare Other | Admitting: Ophthalmology

## 2014-10-25 ENCOUNTER — Encounter (INDEPENDENT_AMBULATORY_CARE_PROVIDER_SITE_OTHER): Payer: Medicare Other | Admitting: Ophthalmology

## 2014-11-06 ENCOUNTER — Encounter (INDEPENDENT_AMBULATORY_CARE_PROVIDER_SITE_OTHER): Payer: Medicare Other | Admitting: Ophthalmology

## 2014-11-06 DIAGNOSIS — H3531 Nonexudative age-related macular degeneration: Secondary | ICD-10-CM | POA: Diagnosis not present

## 2014-11-06 DIAGNOSIS — H43813 Vitreous degeneration, bilateral: Secondary | ICD-10-CM | POA: Diagnosis not present

## 2014-12-21 NOTE — H&P (Signed)
PATIENT NAME:  [name], [name] MR#:  [id number] DATE OF BIRTH:  Jan 26, 1928  DATE OF ADMISSION:  10/27/2013  REFERRING PHYSICIAN:  Dr. Lenise Arena  PRIMARY CARE PHYSICIAN: Nonlocal   CHIEF COMPLAINT: Passing out.  HISTORY OF PRESENT ILLNESS: An 81 year old Caucasian female with past medical history of dementia, hypotension, presenting with syncope while at her nursing facility. She had an originally unwitnessed syncopal episode. Then as they were attempting to stand her up, she subsequently passed out 3 more times. Each episode of loss of consciousness very brief. No loss of bowel or bladder function. No noted seizure activity. While in the Emergency Department they attempted a urine sample. When having her sit from the stretcher, she had one further episode of syncope. Currently she has no complaints, though she is an extremely poor historian.   REVIEW OF SYSTEMS:  Unable to fully obtain secondary to patient's baseline mental status.   PAST MEDICAL HISTORY: Dementia and orthostatic hypotension.   SOCIAL HISTORY: Denies alcohol, tobacco or drug use. Lives at a nursing facility.   FAMILY HISTORY: Positive for dementia. No known cardiovascular diseases.   ALLERGIES: CODEINE, DONEPEZIL, ERYTHROMYCIN and SULFA DRUGS.   HOME MEDICATIONS: Include Florinef 0.1 mg p.o. daily, aspirin 81 mg p.o. daily, 325 mg 2 tablets p.o. 3 times daily as needed for pain, lorazepam 0.5 mg p.o. 3 times a day as needed for anxiety, buspirone 7.5 mg p.o. b.i.d., potassium 20 mEq per 15 mL 22.5 mL daily.   VITAL SIGNS: Temperature 97, heart rate 80, respirations 18, blood pressure 124/81, saturating 96% on room air. Weight 81.6 kg, BMI 30.9.   PHYSICAL EXAMINATION: GENERAL: A well-nourished, well-developed, Caucasian female, currently in no acute distress.  HEAD: Normocephalic, atraumatic.  EYES: Pupils equal, round, react to light. Extraocular movements intact. No scleral icterus.  MOUTH: Dry mucosal  membrane. Dentition intact.  EARS, NOSE, THROAT:  no exudates No external lesions.  NECK: Supple. No thyromegaly. No nodules. No JVD.  PULMONARY: Clear to auscultation bilaterally, without wheezes, rales or rhonchi. No use of accessory muscles. Good respiratory effort bilaterally.  CHEST: Nontender to palpation.  CARDIOVASCULAR: S1, S2. Regular rate and rhythm. No murmurs, rubs, gallops. No edema. Pedal pulse 2+ bilaterally. GASTROINTESTINAL:  Soft, nontender, nondistended. No masses. Positive  bowel sounds. No hepatosplenomegaly.  MUSCULOSKELETAL: No swelling, clubbing, edema. Range of motion full in all extremities.  NEUROLOGIC: Cranial nerves II through XII intact. No gross focal neurologic deficits.  Sensation and reflexes intact.  SKIN: No ulcerations, lesions, rashes, cyanosis. Skin warm and dry. Skin turgor poor.  PSYCHIATRIC: Mood and affect pleasantly confused. She is awake; however, she is oriented only to person. She is unable to provide any meaningful information. Insight and judgment poor.   LABORATORY DATA: Sodium 138, potassium 4.3, chloride 106, bicarb 29, BUN 27, creatinine 1.39, glucose 133, albumin 3.3. Troponin I less than 0.22. WBC 15.2, hemoglobin 10.4, platelets 226. Urinalysis: WBCs 8, RBCs 9, leukocyte esterase trace. CT head performed shows no acute intracranial process. Chest x-ray performed shows no acute cardiopulmonary process. EKG performed:  Normal sinus rhythm, heart rate 73, with LVH.    ASSESSMENT AND PLAN: An 81 year old Caucasian female with past medical history of dementia, hypertension, presenting with what appears to be orthostatic syncope. He had 4 episodes, 3 of them were witnessed.   1.  Syncope. Orthostatic in etiology. IV fluid hydration. Continue with home dose of Florinef. Follow blood pressure. Also place on fall precautions. 2.  Acute kidney injury. IV fluid hydration with  normal saline. Follow urine output and renal function.  3.  Urinary tract  infections. Ciprofloxacin for antibiotic coverage. Follow urine culture.  4.  VT prophylaxis with heparin subcu.   The patient is a FULL CODE.  Time spent:  45 minutes.    ____________________________ Aaron Mose. Azalynn Maxim, MD dkh:mr D: 10/27/2013 21:16:00 ET T: 10/27/2013 22:26:26 ET JOB#: [id number]  cc: Aaron Mose. Amayra Kiedrowski, MD, <Dictator> Anouk Critzer Woodfin Ganja MD ELECTRONICALLY SIGNED 10/28/2013 2:07

## 2014-12-21 NOTE — Discharge Summary (Signed)
PATIENT NAME:  [name], [name] MR#:  [id number] DATE OF BIRTH:  25-Aug-1928  DATE OF ADMISSION:  10/27/2013 DATE OF DISCHARGE:  10/29/2013  PRIMARY CARE PHYSICIAN: Nonlocal.  DISCHARGE DIAGNOSES: 1. Syncope due to orthostatic hypotension.  2. Acute renal failure.  3. Urinary tract infection.   CODE STATUS: Limited code: CPR yes. Intubation no. Mechanical ventilation no. Arrythmia drugs yes. Vasoactive drugs yes. Defibrillation or cardioversion yes.  HOME MEDICATIONS: Please refer to the [facility] physician discharge instruction medication reconciliation list.   DIET: Regular diet. Encourage p.o. fluid intake.   ACTIVITY: As tolerated.   FOLLOW-UP CARE: Follow up with PCP within 1 to 2 weeks.   RECENT FOR ADMISSION: Pass out.  HOSPITAL COURSE: The patient is an 81 year old Caucasian female with a history of dementia, hypotension was sent from nursing home to ED due to syncope episode 3 times. For detailed history and physical examination, please refer to the admission note dictated by Dr. Lavetta Nielsen.  LABORATORY DATA: On admission date showed BUN 27, creatinine 1.39, WBC 15.2, hemoglobin 10.4. Urinalysis showed urinary tract infection. CAT scan of head did not show any acute intracranial process.   Chest x-ray, no acute cardiopulmonary process.   EKG normal sinus rhythm at 73 BPM.   1. Syncope, which is possibly due to orthostatic hypotension. After admission, the patient has been treated with normal saline IV. Blood pressure increased to normal range.   2. Acute renal failure. After fluid rehydration the patient's renal function became normal.   3. For urinary tract infection, the patient has been treated with Cipro.   The patient's vital signs are stable. Physical examination is unremarkable. She is clinically stable. Will be discharged  back to nursing facility today. I discussed the patient's discharge plan with the patient's caregiver, nurse and case manager.   TIME SPENT: About 35  minutes.   ____________________________ Demetrios Loll, MD qc:sg D: 10/29/2013 09:34:36 ET T: 10/29/2013 09:46:12 ET JOB#: [id number]  cc: Demetrios Loll, MD, <Dictator> Demetrios Loll MD ELECTRONICALLY SIGNED 10/29/2013 17:47

## 2015-02-24 ENCOUNTER — Other Ambulatory Visit: Payer: Self-pay

## 2015-02-26 ENCOUNTER — Encounter (INDEPENDENT_AMBULATORY_CARE_PROVIDER_SITE_OTHER): Payer: Medicare Other | Admitting: Ophthalmology

## 2015-05-08 ENCOUNTER — Other Ambulatory Visit: Payer: Self-pay | Admitting: *Deleted

## 2015-05-08 NOTE — Telephone Encounter (Signed)
Left message on daughter's VM asking her to return my call to see if pt is supposed to be on Miralax and which pharmacy it should go too. The last refill we had a mix-up so I wanted to be sure.

## 2015-05-09 MED ORDER — POLYETHYLENE GLYCOL 3350 17 G PO PACK: 17.0000 g | PACK | Freq: Every day | ORAL | Status: DC | PRN

## 2016-03-08 ENCOUNTER — Emergency Department: Payer: Medicare Other

## 2016-03-08 ENCOUNTER — Emergency Department
Admission: EM | Admit: 2016-03-08 | Discharge: 2016-03-08 | Disposition: A | Discharge status: Home / Self Care | Payer: Medicare Other | Attending: Emergency Medicine | Admitting: Emergency Medicine

## 2016-03-08 DIAGNOSIS — W19XXXA Unspecified fall, initial encounter: Secondary | ICD-10-CM | POA: Diagnosis not present

## 2016-03-08 DIAGNOSIS — Y9289 Other specified places as the place of occurrence of the external cause: Secondary | ICD-10-CM | POA: Insufficient documentation

## 2016-03-08 DIAGNOSIS — S062X0A Diffuse traumatic brain injury without loss of consciousness, initial encounter: Secondary | ICD-10-CM | POA: Diagnosis not present

## 2016-03-08 DIAGNOSIS — Z79899 Other long term (current) drug therapy: Secondary | ICD-10-CM | POA: Diagnosis not present

## 2016-03-08 DIAGNOSIS — Y999 Unspecified external cause status: Secondary | ICD-10-CM | POA: Insufficient documentation

## 2016-03-08 DIAGNOSIS — Z7982 Long term (current) use of aspirin: Secondary | ICD-10-CM | POA: Diagnosis not present

## 2016-03-08 DIAGNOSIS — Z85038 Personal history of other malignant neoplasm of large intestine: Secondary | ICD-10-CM | POA: Insufficient documentation

## 2016-03-08 DIAGNOSIS — I951 Orthostatic hypotension: Secondary | ICD-10-CM | POA: Insufficient documentation

## 2016-03-08 DIAGNOSIS — G309 Alzheimer's disease, unspecified: Secondary | ICD-10-CM | POA: Diagnosis not present

## 2016-03-08 DIAGNOSIS — Y9301 Activity, walking, marching and hiking: Secondary | ICD-10-CM | POA: Insufficient documentation

## 2016-03-08 DIAGNOSIS — F329 Major depressive disorder, single episode, unspecified: Secondary | ICD-10-CM | POA: Insufficient documentation

## 2016-03-08 DIAGNOSIS — S0990XA Unspecified injury of head, initial encounter: Secondary | ICD-10-CM | POA: Diagnosis present

## 2016-03-08 DIAGNOSIS — IMO0002 Reserved for concepts with insufficient information to code with codable children: Secondary | ICD-10-CM

## 2016-03-08 NOTE — ED Notes (Signed)
Ems reports that care home [facility]) says she had a fall that was unwitnessed - she has a hematoma on rt forehead and beside lt eye - normal baseline for LOC

## 2016-03-08 NOTE — ED Provider Notes (Signed)
[facility] Emergency Department Provider Note   ____________________________________________  Time seen: Seen upon arrival to the emergency department  I have reviewed the triage vital signs and the nursing notes.   HISTORY  Chief Complaint Fall   HPI [name] is a 82 y.o. female with a history of Alzheimer's dementia who is presenting to the emergency department today after a fall. Per EMS, she was seen walking in the hallway and then was seen on the ground having fallen forward on her face. The patient does not remember the exact circumstances of the fall. She is denying any chest pain or shortness of breath. Denies any pain in her lower extremities. She was noted by EMS to have a small amount of blood along her gums. No prolonged loss of consciousness is noted.   Past Medical History  Diagnosis Date  . Colon cancer [facility])     h/o colon CA 2010- colectomy 2010  (Dr. Lamonte Sakai with oncology)  . Anxiety     H/o anxiety, would use as needed diazepam with relief  . Thyroid disease     H/o thyroid nodule with benign bx 2010  . Orthostasis     h/o orthostasis, didn't tolerate midodrine, prev eval by Dr. Marlou Porch  . Alzheimer's dementia   . PONV (postoperative nausea and vomiting)     Patient Active Problem List   Diagnosis Date Noted  . Visual hallucinations 03/28/2013  . Depression 01/12/2013  . Alzheimer's dementia 06/23/2012  . Syncope 12/06/2011  . Other constipation 03/22/2011  . ADENOCARCINOMA, COLON 07/15/2010  . ANXIETY STATE, UNSPECIFIED 07/15/2010  . POSTURAL HYPOTENSION 07/15/2010    Past Surgical History  Procedure Laterality Date  . US echocardiography  [id number]    normal  . Abdominal hysterectomy    . Partial colectomy  2010    for colon CA    Current Outpatient Rx  Name  Route  Sig  Dispense  Refill  . aspirin 81 MG tablet   Oral   Take 81 mg by mouth daily.         . fludrocortisone (FLORINEF) 0.1 MG tablet    Oral   Take 0.1-0.2 mg by mouth daily. 1 tablet in the morning and 2 tablets in the evening         . LORazepam (ATIVAN) 0.5 MG tablet   Oral   Take 1 tablet (0.5 mg total) by mouth 3 (three) times daily as needed for anxiety or sleep.   90 tablet   1   . polyethylene glycol (MIRALAX / GLYCOLAX) packet   Oral   Take 17 g by mouth daily as needed for moderate constipation.   30 packet   11   . potassium chloride 20 MEQ/15ML (10%) solution   Oral   Take 22.5 mLs (30 mEq total) by mouth daily.   500 mL   0     Allergies Aricept; Erythromycin; and Sulfonamide derivatives  No family history on file.  Social History Social History  Substance Use Topics  . Smoking status: Never Smoker   . Smokeless tobacco: Never Used  . Alcohol Use: Yes     Comment: rare    Review of Systems Constitutional: No fever/chills Eyes: No visual changes. ENT: No sore throat. Cardiovascular: Denies chest pain. Respiratory: Denies shortness of breath. Gastrointestinal: No abdominal pain.  No nausea, no vomiting.  No diarrhea.  No constipation. Genitourinary: Negative for dysuria. Musculoskeletal: Negative for back pain. Skin: Negative for rash. Neurological: Negative  for headaches, focal weakness or numbness.  10-point ROS otherwise negative.  ____________________________________________   PHYSICAL EXAM:  VITAL SIGNS: ED Triage Vitals  Enc Vitals Group     BP 03/08/16 1653 132/52 mmHg     Pulse Rate 03/08/16 1653 78     Resp 03/08/16 1653 16     Temp --      Temp src --      SpO2 --      Weight 03/08/16 1653 160 lb (72.576 kg)     Height 03/08/16 1653 5\' 6"  (1.676 m)     Head Cir --      Peak Flow --      Pain Score 03/08/16 1655 0     Pain Loc --      Pain Edu? --      Excl. in [place]? --     Constitutional: Alert and oriented. Well appearing and in no acute distress. Eyes: Conjunctivae are normal. PERRL. EOMI. Head: Right-sided ecchymosis to the forehead which is about 3  x 4 cm. There is no bogginess and no overlying abrasion. There is also mild swelling to the left mandible without any obvious deformity. No trismus. Nose: No congestion/rhinnorhea. Mouth/Throat: Mucous membranes are moist. Small amount of blood along the mandibular molars without any loose teeth or obvious laceration. No lip laceration. Neck: No stridor.  No tenderness to the midline cervical spine. Appears to range her neck freely without any restriction or pain. Cardiovascular: Normal rate, regular rhythm. Grossly normal heart sounds.   Respiratory: Normal respiratory effort.  No retractions. Lungs CTAB. Gastrointestinal: Soft and nontender. No distention. No abdominal bruits. No CVA tenderness. Musculoskeletal: No lower extremity tenderness nor edema.  No joint effusions. Able to flex at the hips bilaterally. Also able to passively range the hips to full range of motion without any restriction. No lower extremity deformity. No tenderness to the hips bilaterally. Pelvis is stable. Neurologic:  Normal speech and language. No gross focal neurologic deficits are appreciated.  Skin:  Skin is warm, dry and intact. No rash noted. Psychiatric: Mood and affect are normal. Speech and behavior are normal.  ____________________________________________   LABS (all labs ordered are listed, but only abnormal results are displayed)  Labs Reviewed - No data to display ____________________________________________  EKG   ____________________________________________  RADIOLOGY  CT ANGIO CHEST AORTA W/CM &/OR WO/CM (Final result) Result time: 03/08/16 16:52:03   Final result by Rad Results In Interface (03/08/16 16:52:03)   Narrative:   CLINICAL DATA: 82 year old female with a history of right-sided chest pain radiating to the shoulder  EXAM: CT CHEST WITHOUT AND WITH CONTRAST  TECHNIQUE: Multidetector CT imaging of the chest was performed following the standard protocol before and during  bolus administration of intravenous contrast.  CONTRAST: 85 cc Isovue 370  COMPARISON: Chest CT 07/21/2011, abdominal CT 09/20/2013, chest x-ray 03/08/2016  FINDINGS: Chest:  Nonvascular  Unremarkable appearance of the superficial soft tissues. No axillary or supraclavicular adenopathy.  Unremarkable appearance of the thoracic inlet.  No mediastinal adenopathy.  Unremarkable appearance of the thoracic esophagus. Small hiatal hernia.  Linear opacity in the dependent aspects of the bilateral upper lobes with architectural distortion. This is unchanged from the comparison CT of 07/21/2011.  Airways are clear.  Partially calcified granuloma of the right middle lobe, unchanged from prior. No confluent airspace disease, pneumothorax, or pleural effusion.  Vascular:  Cardiac:  Heart size within normal limits. No pericardial fluid/thickening.  Calcifications of the left main, left anterior descending,  circumflex, right coronary arteries.  Aorta:  Noncontrast exam demonstrates no crescentic hyperdensity to suggest intramural hematoma.  Normal course caliber and contour of the thoracic aorta. Diameter of the ascending aorta and descending aorta unremarkable. No dissection flap. No periaortic fluid or inflammatory changes.  Scattered calcifications of the aortic arch.  Three vessel arch. Patency of the proximal branch vessels maintained, with no occlusion or significant stenosis.  No central, lobar, segmental, or proximal subsegmental filling defects of the pulmonary arterial tree.  Upper abdomen:  Unremarkable appearance of the nonvascular structures of the upper abdomen.  Atherosclerotic changes of the aorta of the upper abdomen, with no acute finding identified.  Musculoskeletal:  Partially imaged surgical changes of the cervical region.  No displaced fracture.  Mild degenerative changes of the thoracic spine. No significant bony canal  narrowing  IMPRESSION: No acute finding of the chest CT to account for the patient's symptoms. No evidence of acute aortic syndrome.  Left main and 3 vessel coronary artery disease. Office based assessment may be considered.  Small hiatal hernia.  Aortic atherosclerosis.  Signed,  Dulcy Fanny. Earleen Newport, DO  [facility]  [facility]   Electronically Signed By: Corrie Mckusick D.O. On: 03/08/2016 16:52       ____________________________________________   PROCEDURES   Procedures   ____________________________________________   INITIAL IMPRESSION / ASSESSMENT AND PLAN / ED COURSE  Pertinent labs & imaging results that were available during my care of the patient were reviewed by me and considered in my medical decision making (see chart for details).  ----------------------------------------- 6:49 PM on 03/08/2016 -----------------------------------------  Age with radiology which is reassuring because it is without serious internal injury or any acute injury. Physical have a very small pleural effusion on chest x-ray but without any clinical manifestations. I discussed the case with the patient's daughter, Ms. Richvale, who says that the patient has had frequent falls and that she bruises very easily. The daughter says that the patient as well as getting a wheelchair but they are in the middle of having reviewed by the insurance company. I discussed the radiology results as well as circumstances of the fall which. Be likely from a mechanical fall. The daughter understands the plan for discharge to home and agrees with this plan. Likely mechanical fall. The story from the medics was that the patient was seen walking and then one staff turned around the patient was seen on the ground. Patient with normal vital signs. No complaints of chest pain or shortness of breath. Will be  discharged. ____________________________________________   FINAL CLINICAL IMPRESSION(S) / ED DIAGNOSES  Cephalohematoma. Mechanical fall.    NEW MEDICATIONS STARTED DURING THIS VISIT:  New Prescriptions   No medications on file     Note:  This document was prepared using Dragon voice recognition software and may include unintentional dictation errors.    Orbie Pyo, MD 03/08/16 [phone]

## 2016-03-08 NOTE — ED Notes (Signed)
Ems reports that care home [facility]) says she had a fall that was unwitnessed - she has a hematoma on rt forehead and beside lt eye - normal baseline for LOC

## 2016-03-08 NOTE — ED Notes (Signed)
Secretary notified that pt is ready for transfer back to facility [facility])

## 2016-03-15 ENCOUNTER — Encounter: Payer: Self-pay | Admitting: Emergency Medicine

## 2016-03-15 ENCOUNTER — Emergency Department
Admission: EM | Admit: 2016-03-15 | Discharge: 2016-03-15 | Disposition: A | Discharge status: Skilled Nursing Facility | Payer: Medicare Other | Attending: Emergency Medicine | Admitting: Emergency Medicine

## 2016-03-15 ENCOUNTER — Emergency Department: Payer: Medicare Other

## 2016-03-15 DIAGNOSIS — F329 Major depressive disorder, single episode, unspecified: Secondary | ICD-10-CM | POA: Diagnosis not present

## 2016-03-15 DIAGNOSIS — Z79899 Other long term (current) drug therapy: Secondary | ICD-10-CM | POA: Diagnosis not present

## 2016-03-15 DIAGNOSIS — G309 Alzheimer's disease, unspecified: Secondary | ICD-10-CM | POA: Diagnosis not present

## 2016-03-15 DIAGNOSIS — Y999 Unspecified external cause status: Secondary | ICD-10-CM | POA: Insufficient documentation

## 2016-03-15 DIAGNOSIS — Y929 Unspecified place or not applicable: Secondary | ICD-10-CM | POA: Insufficient documentation

## 2016-03-15 DIAGNOSIS — M25551 Pain in right hip: Secondary | ICD-10-CM

## 2016-03-15 DIAGNOSIS — Y939 Activity, unspecified: Secondary | ICD-10-CM | POA: Insufficient documentation

## 2016-03-15 DIAGNOSIS — Z7982 Long term (current) use of aspirin: Secondary | ICD-10-CM | POA: Insufficient documentation

## 2016-03-15 DIAGNOSIS — W19XXXA Unspecified fall, initial encounter: Secondary | ICD-10-CM | POA: Diagnosis not present

## 2016-03-15 DIAGNOSIS — Z85038 Personal history of other malignant neoplasm of large intestine: Secondary | ICD-10-CM | POA: Diagnosis not present

## 2016-03-15 NOTE — ED Provider Notes (Signed)
[facility] Emergency Department Provider Note   ____________________________________________  Time seen: Approximately 6:00 AM  I have reviewed the triage vital signs and the nursing notes.   HISTORY  Chief Complaint Fall  History limited by dementia  HPI [name] is a 82 y.o. female who presents to the ED from [facility] memory care unit by EMS with a chief complaint of unwitnessed fall. Staff reports to EMS that patient initially complained of right hip pain. She was ambulatory on scene and ambulated to the EMS stretcher. Patient was seen in the ED for fall 1 week ago with facial contusion/head injury. Staff states she was in her usual baseline mental status when they found her. Patient denies LOC, neck pain, vision changes, chest pain, shortness of breath, abdominal pain, nausea, vomiting, diarrhea. Voices no complaints of pain currently.   Past Medical History  Diagnosis Date  . Colon cancer [facility])     h/o colon CA 2010- colectomy 2010  (Dr. Lamonte Sakai with oncology)  . Anxiety     H/o anxiety, would use as needed diazepam with relief  . Thyroid disease     H/o thyroid nodule with benign bx 2010  . Orthostasis     h/o orthostasis, didn't tolerate midodrine, prev eval by Dr. Marlou Porch  . Alzheimer's dementia   . PONV (postoperative nausea and vomiting)     Patient Active Problem List   Diagnosis Date Noted  . Visual hallucinations 03/28/2013  . Depression 01/12/2013  . Alzheimer's dementia 06/23/2012  . Syncope 12/06/2011  . Other constipation 03/22/2011  . ADENOCARCINOMA, COLON 07/15/2010  . ANXIETY STATE, UNSPECIFIED 07/15/2010  . POSTURAL HYPOTENSION 07/15/2010    Past Surgical History  Procedure Laterality Date  . US echocardiography  [id number]    normal  . Abdominal hysterectomy    . Partial colectomy  2010    for colon CA    Current Outpatient Rx  Name  Route  Sig  Dispense  Refill  . aspirin 81 MG chewable tablet   Oral  Chew 81 mg by mouth daily.         . busPIRone (BUSPAR) 10 MG tablet   Oral   Take 20 mg by mouth 2 (two) times daily.         . cholecalciferol (VITAMIN D) 1000 units tablet   Oral   Take 2,000 Units by mouth daily.         . divalproex (DEPAKOTE SPRINKLE) 125 MG capsule   Oral   Take 375 mg by mouth 2 (two) times daily.         . fludrocortisone (FLORINEF) 0.1 MG tablet   Oral   Take 0.1 mg by mouth daily.          . furosemide (LASIX) 20 MG tablet   Oral   Take 10 mg by mouth 3 (three) times a week. On Monday, Wednesday and Saturday         . neomycin-bacitracin-polymyxin (NEOSPORIN) ophthalmic ointment   Right Eye   Place 1 application into the right eye 2 (two) times daily as needed (for red eyelid).         . polyethylene glycol (MIRALAX / GLYCOLAX) packet   Oral   Take 17 g by mouth daily as needed for moderate constipation.   30 packet   11   . potassium chloride 20 MEQ/15ML (10%) solution   Oral   Take 22.5 mLs (30 mEq total) by mouth daily.   500 mL  0   . QUEtiapine (SEROQUEL) 25 MG tablet   Oral   Take 25 mg by mouth 2 (two) times daily.         . traZODone (DESYREL) 50 MG tablet   Oral   Take 25 mg by mouth 2 (two) times daily.         Marland Kitchen LORazepam (ATIVAN) 0.5 MG tablet   Oral   Take 1 tablet (0.5 mg total) by mouth 3 (three) times daily as needed for anxiety or sleep. Patient not taking: Reported on 03/15/2016   90 tablet   1     Allergies Aricept; Codeine; Erythromycin; and Sulfonamide derivatives  No family history on file.  Social History Social History  Substance Use Topics  . Smoking status: Never Smoker   . Smokeless tobacco: Never Used  . Alcohol Use: Yes     Comment: rare    Review of Systems  Constitutional: No fever/chills. Eyes: No visual changes. ENT: No sore throat. Cardiovascular: Denies chest pain. Respiratory: Denies shortness of breath. Gastrointestinal: No abdominal pain.  No nausea, no  vomiting.  No diarrhea.  No constipation. Genitourinary: Negative for dysuria. Musculoskeletal: Positive for right hip pain by report. Negative for back pain. Skin: Negative for rash. Neurological: Negative for headaches, focal weakness or numbness.  10-point ROS otherwise negative.  ____________________________________________   PHYSICAL EXAM:  VITAL SIGNS: ED Triage Vitals  Enc Vitals Group     BP 03/15/16 0447 113/60 mmHg     Pulse Rate 03/15/16 0447 66     Resp 03/15/16 0447 16     Temp 03/15/16 0447 97.6 F (36.4 C)     Temp Source 03/15/16 0447 Oral     SpO2 03/15/16 0447 97 %     Weight 03/15/16 0447 142 lb (64.411 kg)     Height 03/15/16 0447 5\' 5"  (1.651 m)     Head Cir --      Peak Flow --      Pain Score --      Pain Loc --      Pain Edu? --      Excl. in [place]? --     Constitutional: Alert and oriented. Well appearing and in no acute distress. Pleasantly demented. Eyes: Conjunctivae are normal. PERRL. EOMI. Head: Atraumatic. Nose: No congestion/rhinnorhea. Mouth/Throat: Mucous membranes are moist.  Oropharynx non-erythematous. Neck: No stridor.  No cervical spine tenderness to palpation. Cardiovascular: Normal rate, regular rhythm. Grossly normal heart sounds.  Good peripheral circulation. Respiratory: Normal respiratory effort.  No retractions. Lungs CTAB. Gastrointestinal: Soft and nontender. No distention. No abdominal bruits. No CVA tenderness. Musculoskeletal: No lower extremity tenderness nor edema.  Full range of motion right hip without pain.  No shortening or rotation.  2+ femoral and distal pulses.  Calf is supple without evidence for compartment syndrome.  No joint effusions.  Brisk, less than 5 second capillary refill.  Limb is symmetrically warm without evidence for ischemia. Neurologic:  Alert and oriented to person only which is baseline for patient. Normal speech and language. No gross focal neurologic deficits are appreciated. Moves all extremities  4. Skin:  Skin is warm, dry and intact. No rash noted. Psychiatric: Mood and affect are normal. Speech and behavior are normal.  ____________________________________________   LABS (all labs ordered are listed, but only abnormal results are displayed)  Labs Reviewed - No data to display ____________________________________________  EKG  ED ECG REPORT I, Reighn Kaplan J, the attending physician, personally viewed and interpreted this ECG.  Date: 03/15/2016  EKG Time: 0505  Rate: 61  Rhythm: normal EKG, normal sinus rhythm  Axis: Normal  Intervals:none  ST&T Change: Nonspecific  ____________________________________________  RADIOLOGY  Right hip x-rays (viewed by me, interpreted per Dr. Pascal Lux): No evidence of hip fracture. Intact pelvic ring.  Osteitis pubis.  Stable bone island over the left greater trochanter. ____________________________________________   PROCEDURES  Procedure(s) performed: None  Procedures  Critical Care performed: No  ____________________________________________   INITIAL IMPRESSION / ASSESSMENT AND PLAN / ED COURSE  Pertinent labs & imaging results that were available during my care of the patient were reviewed by me and considered in my medical decision making (see chart for details).  82 year old female who presents s/p unwitnessed fall at nursing facility. She is not on anticoagulants is is in her baseline mental state per nursing facility staff. She was ambulatory to the ambulance although she did initially complain of right hip pain. Will obtain plain film x-rays of right hip. CT head deferred given patient is in her baseline mental state and she recently had head CT one week ago which did not demonstrate intracranial hemorrhage. Will monitor patient for neurological deterioration.  ----------------------------------------- 6:56 AM on 03/15/2016 -----------------------------------------  Patient remains pleasantly confused. No  complaints of dizziness, nausea or vomiting. Updated patient of x-ray results. Strict return precautions given. Patient verbalizes understanding and agrees with plan of care. ____________________________________________   FINAL CLINICAL IMPRESSION(S) / ED DIAGNOSES  Final diagnoses:  Fall, initial encounter  Right hip pain      NEW MEDICATIONS STARTED DURING THIS VISIT:  New Prescriptions   No medications on file     Note:  This document was prepared using Dragon voice recognition software and may include unintentional dictation errors.    Paulette Blanch, MD 03/15/16 [phone]

## 2016-03-15 NOTE — ED Notes (Signed)
Report called to [place] to [place].

## 2016-03-15 NOTE — ED Notes (Signed)
[place].  Notified Tammy of patient's discharge and that we are waiting for ambulance transport.  Unable to give report to tech due to "tech is in patient's room".

## 2016-03-15 NOTE — ED Notes (Addendum)
Returned call back to [phone] and talked to [facility] and gave update on patient.

## 2016-03-15 NOTE — ED Notes (Signed)
EMS report pt. Had reported to staff she had rt. Sided hip pain.  Pt. Denied pain to EMS.  Pt. Was ambulatory at scene.

## 2016-03-15 NOTE — ED Notes (Signed)
[facility] arrived to ED to pick patient up.  Belongings sent home with patient.

## 2016-03-15 NOTE — Discharge Instructions (Signed)
°  1. Continue all medications as directed by your doctor. 2. Return to the ER for worsening symptoms, persistent vomiting, lethargy or other concerns.  Hip Pain Your hip is the joint between your upper legs and your lower pelvis. The bones, cartilage, tendons, and muscles of your hip joint perform a lot of work each day supporting your body weight and allowing you to move around. Hip pain can range from a minor ache to severe pain in one or both of your hips. Pain may be felt on the inside of the hip joint near the groin, or the outside near the buttocks and upper thigh. You may have swelling or stiffness as well.  HOME CARE INSTRUCTIONS   Take medicines only as directed by your health care provider.  Apply ice to the injured area:  Put ice in a plastic bag.  Place a towel between your skin and the bag.  Leave the ice on for 15-20 minutes at a time, 3-4 times a day.  Keep your leg raised (elevated) when possible to lessen swelling.  Avoid activities that cause pain.  Follow specific exercises as directed by your health care provider.  Sleep with a pillow between your legs on your most comfortable side.  Record how often you have hip pain, the location of the pain, and what it feels like. SEEK MEDICAL CARE IF:   You are unable to put weight on your leg.  Your hip is red or swollen or very tender to touch.  Your pain or swelling continues or worsens after 1 week.  You have increasing difficulty walking.  You have a fever. SEEK IMMEDIATE MEDICAL CARE IF:   You have fallen.  You have a sudden increase in pain and swelling in your hip. MAKE SURE YOU:   Understand these instructions.  Will watch your condition.  Will get help right away if you are not doing well or get worse.   This information is not intended to replace advice given to you by your health care provider. Make sure you discuss any questions you have with your health care provider.   Document Released:  02/03/2010 Document Revised: 09/06/2014 Document Reviewed: 04/12/2013 Elsevier Interactive Patient Education [employer].

## 2016-03-15 NOTE — ED Notes (Signed)
Pt had unwitnessed fall at [facility] care unit at around 3 am.  Pt. Was ambulatory on scene and walked to stretcher when EMS arrived.

## 2016-03-19 ENCOUNTER — Encounter: Payer: Self-pay | Admitting: Emergency Medicine

## 2016-03-19 ENCOUNTER — Emergency Department: Payer: Medicare Other

## 2016-03-19 ENCOUNTER — Emergency Department
Admission: EM | Admit: 2016-03-19 | Discharge: 2016-03-19 | Disposition: A | Discharge status: Home / Self Care | Payer: Medicare Other | Attending: Emergency Medicine | Admitting: Emergency Medicine

## 2016-03-19 DIAGNOSIS — Y939 Activity, unspecified: Secondary | ICD-10-CM | POA: Insufficient documentation

## 2016-03-19 DIAGNOSIS — Y929 Unspecified place or not applicable: Secondary | ICD-10-CM | POA: Insufficient documentation

## 2016-03-19 DIAGNOSIS — Z7982 Long term (current) use of aspirin: Secondary | ICD-10-CM | POA: Diagnosis not present

## 2016-03-19 DIAGNOSIS — S0993XA Unspecified injury of face, initial encounter: Secondary | ICD-10-CM | POA: Diagnosis not present

## 2016-03-19 DIAGNOSIS — Y999 Unspecified external cause status: Secondary | ICD-10-CM | POA: Insufficient documentation

## 2016-03-19 DIAGNOSIS — W010XXA Fall on same level from slipping, tripping and stumbling without subsequent striking against object, initial encounter: Secondary | ICD-10-CM | POA: Diagnosis not present

## 2016-03-19 DIAGNOSIS — Z79899 Other long term (current) drug therapy: Secondary | ICD-10-CM | POA: Insufficient documentation

## 2016-03-19 DIAGNOSIS — F329 Major depressive disorder, single episode, unspecified: Secondary | ICD-10-CM | POA: Insufficient documentation

## 2016-03-19 DIAGNOSIS — G309 Alzheimer's disease, unspecified: Secondary | ICD-10-CM | POA: Diagnosis not present

## 2016-03-19 DIAGNOSIS — Z85038 Personal history of other malignant neoplasm of large intestine: Secondary | ICD-10-CM | POA: Insufficient documentation

## 2016-03-19 MED ORDER — LORAZEPAM 2 MG/ML IJ SOLN
2.0000 mg | Freq: Once | INTRAMUSCULAR | Status: AC
  Administered 2016-03-19: 2 mg via INTRAMUSCULAR
  Filled 2016-03-19: qty 1

## 2016-03-19 MED ORDER — HALOPERIDOL LACTATE 5 MG/ML IJ SOLN
5.0000 mg | Freq: Once | INTRAMUSCULAR | Status: AC
  Administered 2016-03-19: 5 mg via INTRAMUSCULAR
  Filled 2016-03-19: qty 1

## 2016-03-19 MED ORDER — LORAZEPAM 2 MG/ML IJ SOLN
INTRAMUSCULAR | Status: DC
Start: 2016-03-19 — End: 2016-03-19
  Filled 2016-03-19: qty 1

## 2016-03-19 NOTE — ED Notes (Signed)
Patient sleeping

## 2016-03-19 NOTE — ED Notes (Signed)
CT unable to perform CT due to patient being agitated. Verbal order given for ativan 2 mg IM

## 2016-03-19 NOTE — ED Notes (Signed)
Patient transported to CT 

## 2016-03-19 NOTE — ED Notes (Signed)
Pt resting quietly with eyes closed at this time.

## 2016-03-19 NOTE — ED Notes (Addendum)
Pt resting quietly with eyes closed at this time.

## 2016-03-19 NOTE — ED Notes (Signed)
Spoke with Lattie Haw, patients daughter and POA, informed her that patients CTs were negative and that patient is being discharged back to [facility].

## 2016-03-19 NOTE — ED Notes (Signed)
Patient agitated and fighting with staff, trying to get off of stretcher. Verbal order given for Haldol 5 mg IM by Dr. Marcelene Butte

## 2016-03-19 NOTE — ED Notes (Addendum)
Called [facility] to see if there is anyone to transport patient back to their facility, per Ty, their transportation staff just left for the day, patient will have to go back by EMS

## 2016-03-19 NOTE — ED Provider Notes (Signed)
Time Seen: Approximately 1435  I have reviewed the triage notes  Chief Complaint: Fall   History of Present Illness: [name] is a 82 y.o. female who just fell on the 17th and was sent here for evaluation. Patient apparently had another non-syncopal fall today. She apparently was ambulating with a cane or walker and tripped over another walker that was in another patient's room. There was no loss of consciousness. Patient has a history of Alzheimer's dementia and anxiety. She has indications of her previous head trauma and now has what appears to be a nasal fracture. Patient's really not able to give any significant history and review of systems secondary to her dementia and agitated state. Patient was ambulatory after her fall and hasn't had any other obvious deformity or concerns   Past Medical History  Diagnosis Date  . Colon cancer [facility])     h/o colon CA 2010- colectomy 2010  (Dr. Lamonte Sakai with oncology)  . Anxiety     H/o anxiety, would use as needed diazepam with relief  . Thyroid disease     H/o thyroid nodule with benign bx 2010  . Orthostasis     h/o orthostasis, didn't tolerate midodrine, prev eval by Dr. Marlou Porch  . Alzheimer's dementia   . PONV (postoperative nausea and vomiting)     Patient Active Problem List   Diagnosis Date Noted  . Visual hallucinations 03/28/2013  . Depression 01/12/2013  . Alzheimer's dementia 06/23/2012  . Syncope 12/06/2011  . Other constipation 03/22/2011  . ADENOCARCINOMA, COLON 07/15/2010  . ANXIETY STATE, UNSPECIFIED 07/15/2010  . POSTURAL HYPOTENSION 07/15/2010    Past Surgical History  Procedure Laterality Date  . US echocardiography  [id number]    normal  . Abdominal hysterectomy    . Partial colectomy  2010    for colon CA    Past Surgical History  Procedure Laterality Date  . US echocardiography  [id number]    normal  . Abdominal hysterectomy    . Partial colectomy  2010    for colon CA    Current Outpatient Rx   Name  Route  Sig  Dispense  Refill  . aspirin 81 MG chewable tablet   Oral   Chew 81 mg by mouth daily.         . busPIRone (BUSPAR) 10 MG tablet   Oral   Take 20 mg by mouth 2 (two) times daily.         . cholecalciferol (VITAMIN D) 1000 units tablet   Oral   Take 2,000 Units by mouth daily.         . divalproex (DEPAKOTE SPRINKLE) 125 MG capsule   Oral   Take 375 mg by mouth 2 (two) times daily.         . fludrocortisone (FLORINEF) 0.1 MG tablet   Oral   Take 0.1 mg by mouth daily.          . furosemide (LASIX) 20 MG tablet   Oral   Take 10 mg by mouth 3 (three) times a week. On Monday, Wednesday and Saturday         . LORazepam (ATIVAN) 0.5 MG tablet   Oral   Take 1 tablet (0.5 mg total) by mouth 3 (three) times daily as needed for anxiety or sleep. Patient not taking: Reported on 03/15/2016   90 tablet   1   . neomycin-bacitracin-polymyxin (NEOSPORIN) ophthalmic ointment   Right Eye   Place 1 application into the right  eye 2 (two) times daily as needed (for red eyelid).         . polyethylene glycol (MIRALAX / GLYCOLAX) packet   Oral   Take 17 g by mouth daily as needed for moderate constipation.   30 packet   11   . potassium chloride 20 MEQ/15ML (10%) solution   Oral   Take 22.5 mLs (30 mEq total) by mouth daily.   500 mL   0   . QUEtiapine (SEROQUEL) 25 MG tablet   Oral   Take 25 mg by mouth 2 (two) times daily.         . traZODone (DESYREL) 50 MG tablet   Oral   Take 25 mg by mouth 2 (two) times daily.           Allergies:  Aricept; Codeine; Erythromycin; and Sulfonamide derivatives  Family History: No family history on file.  Social History: Social History  Substance Use Topics  . Smoking status: Never Smoker   . Smokeless tobacco: Never Used  . Alcohol Use: No     Comment: rare     Review of Systems:   10 point review of systems was performed and was otherwise negative: Review of systems was acquired through the  medical records and EMS Constitutional: No fever Eyes: No visual disturbances ENT: No sore throat, ear pain Cardiac: No chest pain Respiratory: No shortness of breath, wheezing, or stridor Abdomen: No abdominal pain, no vomiting, No diarrhea Endocrine: No weight loss, No night sweats Extremities: No peripheral edema, cyanosis Skin: No rashes, easy bruising Neurologic: No focal weakness, trouble with speech or swollowing Urologic: No dysuria, Hematuria, or urinary frequency   Physical Exam:  ED Triage Vitals  Enc Vitals Group     BP 03/19/16 1429 139/70 mmHg     Pulse Rate 03/19/16 1429 69     Resp 03/19/16 1429 16     Temp --      Temp src --      SpO2 03/19/16 1429 99 %     Weight --      Height --      Head Cir --      Peak Flow --      Pain Score --      Pain Loc --      Pain Edu? --      Excl. in [place]? --     General: Awake , Alert ,Cooperative at times but essentially very agitated.. Difficult EMS transported the patient tried to get up and out of the stretcher etc.  Head: Normal cephalic ,  old contusion left frontal region. No acute instability Eyes: Pupils equal , round, reactive to light Nose/Throat: Nasal fracture ,No nasal drainage, patent upper airway without erythema or exudate.  Neck: Supple, Full range of motion, No anterior adenopathy or palpable thyroid masses Lungs: Clear to ascultation without wheezes , rhonchi, or rales Heart: Regular rate, regular rhythm without murmurs , gallops , or rubs Abdomen: Soft, non tender without rebound, guarding , or rigidity; bowel sounds positive and symmetric in all 4 quadrants. No organomegaly .        Extremities: 2 plus symmetric pulses. No edema, clubbing or cyanosis Neurologic: normal ambulation, Motor symmetric without deficits, sensory intact Skin: warm, dry, no rashes    Radiology: *    CT HEAD WO CONTRAST (Final result) Result time: 03/19/16 16:08:26   Final result by Rad Results In Interface (03/19/16  16:08:26)   Narrative:   CLINICAL DATA:  Recent fall at nursing home, obvious facial trauma and bruising.  EXAM: CT HEAD WITHOUT CONTRAST  CT MAXILLOFACIAL WITHOUT CONTRAST  CT CERVICAL SPINE WITHOUT CONTRAST  TECHNIQUE: Multidetector CT imaging of the head, cervical spine, and maxillofacial structures were performed using the standard protocol without intravenous contrast. Multiplanar CT image reconstructions of the cervical spine and maxillofacial structures were also generated.  COMPARISON: 03/08/2016  FINDINGS: CT HEAD FINDINGS  Limited with motion artifact. Stable brain atrophy and chronic white matter microvascular ischemic changes throughout the cerebral hemispheres. Remote left basal ganglia lacunar-type infarcts. No acute intracranial hemorrhage, mass lesion, infarction, midline shift, herniation, hydrocephalus, or extra-axial fluid collection. Ventricles are symmetric. Cisterns are patent. Cerebellar atrophy as well. Atherosclerosis of the intracranial vessels of the skullbase. Orbits are symmetric. Skull appears intact. Mastoids clear. Minor ethmoid mucosal thickening. Sinuses remain clear otherwise.  CT MAXILLOFACIAL FINDINGS  Also limited with motion artifact. This severely limits evaluation of the mandible. Maxilla, pterygoid plates, nasal septum, nasal bones, zygomas, skullbase, and orbits appear intact. Focal right anterior maxillary and nasal soft tissue swelling/ bruising noted from the fall. No orbital blowout fracture. Mastoids and sinuses remain clear. No sinus hemorrhage or hematoma. Orbits appear symmetric.  CT CERVICAL SPINE FINDINGS  Normal cervical spine alignment. Bones are osteopenic. Degenerative spondylosis at all levels of the cervical spine, most pronounced and C4-5, C5-6, and C6-7. These 3 levels demonstrate disc space narrowing, sclerosis and osteophytes. Multilevel facet arthropathy noted posteriorly. No subluxation or  dislocation. No acute fracture, compression deformity, or focal kyphosis. Normal prevertebral soft tissues. Intact odontoid.  Clear lung apices. Metallic streak artifact from the dental hardware. No soft tissue asymmetry in the neck. Carotid atherosclerosis noted. Heterogeneous thyroid masses again noted bilaterally extending substernal. No significant interval change in the thyroid abnormalities.  IMPRESSION: No acute intracranial process. Stable atrophy and chronic white matter microvascular ischemic changes. No acute intracranial hemorrhage.  No definite acute facial bony trauma or fracture. Limited assessment of the mandible because of motion artifact to exclude a mandible fracture. No other facial bony trauma or fracture. Right maxillary and nasal soft tissue swelling/ bruising noted.  Stable cervical degenerative spondylosis without malalignment or acute cervical spine fracture.  Stable hypodense masses in the thyroid.   Electronically Signed By: Jerilynn Mages. Shick M.D. On: 03/19/2016 16:08          CT Cervical Spine Wo Contrast (Final result) Result time: 03/19/16 16:08:26   Final result by Rad Results In Interface (03/19/16 16:08:26)   Narrative:   CLINICAL DATA: Recent fall at nursing home, obvious facial trauma and bruising.  EXAM: CT HEAD WITHOUT CONTRAST  CT MAXILLOFACIAL WITHOUT CONTRAST  CT CERVICAL SPINE WITHOUT CONTRAST  TECHNIQUE: Multidetector CT imaging of the head, cervical spine, and maxillofacial structures were performed using the standard protocol without intravenous contrast. Multiplanar CT image reconstructions of the cervical spine and maxillofacial structures were also generated.  COMPARISON: 03/08/2016  FINDINGS: CT HEAD FINDINGS  Limited with motion artifact. Stable brain atrophy and chronic white matter microvascular ischemic changes throughout the cerebral hemispheres. Remote left basal ganglia lacunar-type infarcts.  No acute intracranial hemorrhage, mass lesion, infarction, midline shift, herniation, hydrocephalus, or extra-axial fluid collection. Ventricles are symmetric. Cisterns are patent. Cerebellar atrophy as well. Atherosclerosis of the intracranial vessels of the skullbase. Orbits are symmetric. Skull appears intact. Mastoids clear. Minor ethmoid mucosal thickening. Sinuses remain clear otherwise.  CT MAXILLOFACIAL FINDINGS  Also limited with motion artifact. This severely limits evaluation of the mandible. Maxilla, pterygoid plates, nasal septum,  nasal bones, zygomas, skullbase, and orbits appear intact. Focal right anterior maxillary and nasal soft tissue swelling/ bruising noted from the fall. No orbital blowout fracture. Mastoids and sinuses remain clear. No sinus hemorrhage or hematoma. Orbits appear symmetric.  CT CERVICAL SPINE FINDINGS  Normal cervical spine alignment. Bones are osteopenic. Degenerative spondylosis at all levels of the cervical spine, most pronounced and C4-5, C5-6, and C6-7. These 3 levels demonstrate disc space narrowing, sclerosis and osteophytes. Multilevel facet arthropathy noted posteriorly. No subluxation or dislocation. No acute fracture, compression deformity, or focal kyphosis. Normal prevertebral soft tissues. Intact odontoid.  Clear lung apices. Metallic streak artifact from the dental hardware. No soft tissue asymmetry in the neck. Carotid atherosclerosis noted. Heterogeneous thyroid masses again noted bilaterally extending substernal. No significant interval change in the thyroid abnormalities.  IMPRESSION: No acute intracranial process. Stable atrophy and chronic white matter microvascular ischemic changes. No acute intracranial hemorrhage.  No definite acute facial bony trauma or fracture. Limited assessment of the mandible because of motion artifact to exclude a mandible fracture. No other facial bony trauma or fracture. Right  maxillary and nasal soft tissue swelling/ bruising noted.  Stable cervical degenerative spondylosis without malalignment or acute cervical spine fracture.  Stable hypodense masses in the thyroid.   Electronically Signed By: Jerilynn Mages. Shick M.D. On: 03/19/2016 16:08          CT MAXILLOFACIAL WO CONTRAST (Final result) Result time: 03/19/16 16:08:26   Final result by Rad Results In Interface (03/19/16 16:08:26)   Narrative:   CLINICAL DATA: Recent fall at nursing home, obvious facial trauma and bruising.  EXAM: CT HEAD WITHOUT CONTRAST  CT MAXILLOFACIAL WITHOUT CONTRAST  CT CERVICAL SPINE WITHOUT CONTRAST  TECHNIQUE: Multidetector CT imaging of the head, cervical spine, and maxillofacial structures were performed using the standard protocol without intravenous contrast. Multiplanar CT image reconstructions of the cervical spine and maxillofacial structures were also generated.  COMPARISON: 03/08/2016  FINDINGS: CT HEAD FINDINGS  Limited with motion artifact. Stable brain atrophy and chronic white matter microvascular ischemic changes throughout the cerebral hemispheres. Remote left basal ganglia lacunar-type infarcts. No acute intracranial hemorrhage, mass lesion, infarction, midline shift, herniation, hydrocephalus, or extra-axial fluid collection. Ventricles are symmetric. Cisterns are patent. Cerebellar atrophy as well. Atherosclerosis of the intracranial vessels of the skullbase. Orbits are symmetric. Skull appears intact. Mastoids clear. Minor ethmoid mucosal thickening. Sinuses remain clear otherwise.  CT MAXILLOFACIAL FINDINGS  Also limited with motion artifact. This severely limits evaluation of the mandible. Maxilla, pterygoid plates, nasal septum, nasal bones, zygomas, skullbase, and orbits appear intact. Focal right anterior maxillary and nasal soft tissue swelling/ bruising noted from the fall. No orbital blowout fracture. Mastoids and  sinuses remain clear. No sinus hemorrhage or hematoma. Orbits appear symmetric.  CT CERVICAL SPINE FINDINGS  Normal cervical spine alignment. Bones are osteopenic. Degenerative spondylosis at all levels of the cervical spine, most pronounced and C4-5, C5-6, and C6-7. These 3 levels demonstrate disc space narrowing, sclerosis and osteophytes. Multilevel facet arthropathy noted posteriorly. No subluxation or dislocation. No acute fracture, compression deformity, or focal kyphosis. Normal prevertebral soft tissues. Intact odontoid.  Clear lung apices. Metallic streak artifact from the dental hardware. No soft tissue asymmetry in the neck. Carotid atherosclerosis noted. Heterogeneous thyroid masses again noted bilaterally extending substernal. No significant interval change in the thyroid abnormalities.  IMPRESSION: No acute intracranial process. Stable atrophy and chronic white matter microvascular ischemic changes. No acute intracranial hemorrhage.  No definite acute facial bony trauma or fracture. Limited assessment  of the mandible because of motion artifact to exclude a mandible fracture. No other facial bony trauma or fracture. Right maxillary and nasal soft tissue swelling/ bruising noted.  Stable cervical degenerative spondylosis without malalignment or acute cervical spine fracture.  Stable hypodense masses in the thyroid.   Electronically Signed By: Jerilynn Mages. Shick M.D. On: 03/19/2016 16:08              I personally reviewed the radiologic studies    ED Course:  Patient's stay was uneventful and she required IM Haldol and I am Ativan for sedation so that we can perform his studies. She does not appear to suffer any significant injury per CAT scan evaluation. The patient will be transported back to her nursing facility. No Change in her current medications.  Assessment:  Status post non-syncopal fall with acute facial injury*   F   Plan:  Outpatient  management Patient was advised to return immediately if condition worsens. Patient was advised to follow up with their primary care physician or other specialized physicians involved in their outpatient care. The patient and/or family member/power of attorney had laboratory results reviewed at the bedside. All questions and concerns were addressed and appropriate discharge instructions were distributed by the nursing staff.             Daymon Larsen, MD 03/19/16 [phone]

## 2016-03-19 NOTE — ED Notes (Signed)
Called and spoke with Cherrelle at [facility] and informed her patients CT scans were negative and that patient was given Haldol 5 mg IM and Ativan 2 mg IM and patient needs to follow up with her PCP. Cherrelle verbalized understanding.

## 2016-03-19 NOTE — ED Notes (Signed)
Patient sleeping, safety sitter a bedside

## 2016-03-19 NOTE — ED Notes (Signed)
Patient brought in by [facility] from [facility] for a fall today, patient has obvious to the nose and old bruising to the left and right side of the face

## 2016-07-05 ENCOUNTER — Encounter: Payer: Self-pay | Admitting: Emergency Medicine

## 2016-07-05 ENCOUNTER — Emergency Department: Payer: Medicare Other

## 2016-07-05 ENCOUNTER — Observation Stay
Admission: EM | Admit: 2016-07-05 | Discharge: 2016-07-06 | Payer: Medicare Other | Attending: Internal Medicine | Admitting: Internal Medicine

## 2016-07-05 DIAGNOSIS — Z885 Allergy status to narcotic agent status: Secondary | ICD-10-CM | POA: Insufficient documentation

## 2016-07-05 DIAGNOSIS — F329 Major depressive disorder, single episode, unspecified: Secondary | ICD-10-CM | POA: Insufficient documentation

## 2016-07-05 DIAGNOSIS — W19XXXA Unspecified fall, initial encounter: Secondary | ICD-10-CM | POA: Diagnosis present

## 2016-07-05 DIAGNOSIS — M7989 Other specified soft tissue disorders: Secondary | ICD-10-CM | POA: Insufficient documentation

## 2016-07-05 DIAGNOSIS — S0003XA Contusion of scalp, initial encounter: Secondary | ICD-10-CM | POA: Diagnosis not present

## 2016-07-05 DIAGNOSIS — Z888 Allergy status to other drugs, medicaments and biological substances status: Secondary | ICD-10-CM | POA: Insufficient documentation

## 2016-07-05 DIAGNOSIS — G309 Alzheimer's disease, unspecified: Secondary | ICD-10-CM | POA: Diagnosis not present

## 2016-07-05 DIAGNOSIS — R296 Repeated falls: Secondary | ICD-10-CM | POA: Diagnosis not present

## 2016-07-05 DIAGNOSIS — Z791 Long term (current) use of non-steroidal anti-inflammatories (NSAID): Secondary | ICD-10-CM | POA: Insufficient documentation

## 2016-07-05 DIAGNOSIS — M25561 Pain in right knee: Secondary | ICD-10-CM

## 2016-07-05 DIAGNOSIS — S0990XA Unspecified injury of head, initial encounter: Secondary | ICD-10-CM | POA: Diagnosis present

## 2016-07-05 DIAGNOSIS — Z66 Do not resuscitate: Secondary | ICD-10-CM | POA: Diagnosis not present

## 2016-07-05 DIAGNOSIS — Z85038 Personal history of other malignant neoplasm of large intestine: Secondary | ICD-10-CM | POA: Insufficient documentation

## 2016-07-05 DIAGNOSIS — F0280 Dementia in other diseases classified elsewhere without behavioral disturbance: Secondary | ICD-10-CM | POA: Diagnosis not present

## 2016-07-05 DIAGNOSIS — R262 Difficulty in walking, not elsewhere classified: Secondary | ICD-10-CM

## 2016-07-05 DIAGNOSIS — I951 Orthostatic hypotension: Secondary | ICD-10-CM | POA: Diagnosis not present

## 2016-07-05 DIAGNOSIS — Z79899 Other long term (current) drug therapy: Secondary | ICD-10-CM | POA: Diagnosis not present

## 2016-07-05 DIAGNOSIS — F411 Generalized anxiety disorder: Secondary | ICD-10-CM | POA: Diagnosis not present

## 2016-07-05 DIAGNOSIS — Z881 Allergy status to other antibiotic agents status: Secondary | ICD-10-CM | POA: Insufficient documentation

## 2016-07-05 DIAGNOSIS — Z880 Allergy status to penicillin: Secondary | ICD-10-CM | POA: Insufficient documentation

## 2016-07-05 DIAGNOSIS — E049 Nontoxic goiter, unspecified: Secondary | ICD-10-CM | POA: Insufficient documentation

## 2016-07-05 DIAGNOSIS — Z7982 Long term (current) use of aspirin: Secondary | ICD-10-CM | POA: Diagnosis not present

## 2016-07-05 DIAGNOSIS — Z882 Allergy status to sulfonamides status: Secondary | ICD-10-CM | POA: Diagnosis not present

## 2016-07-05 DIAGNOSIS — S01112A Laceration without foreign body of left eyelid and periocular area, initial encounter: Secondary | ICD-10-CM | POA: Diagnosis present

## 2016-07-05 LAB — COMPREHENSIVE METABOLIC PANEL
ALT: 10 U/L — ABNORMAL LOW (ref 14–54)
ANION GAP: 5 (ref 5–15)
AST: 18 U/L (ref 15–41)
Albumin: 3.5 g/dL (ref 3.5–5.0)
Alkaline Phosphatase: 77 U/L (ref 38–126)
BILIRUBIN TOTAL: 0.5 mg/dL (ref 0.3–1.2)
BUN: 25 mg/dL — ABNORMAL HIGH (ref 6–20)
CHLORIDE: 102 mmol/L (ref 101–111)
CO2: 30 mmol/L (ref 22–32)
Calcium: 9 mg/dL (ref 8.9–10.3)
Creatinine, Ser: 1.25 mg/dL — ABNORMAL HIGH (ref 0.44–1.00)
GFR, EST AFRICAN AMERICAN: 44 mL/min — AB (ref >60)
GFR, EST NON AFRICAN AMERICAN: 38 mL/min — AB (ref >60)
Glucose, Bld: 120 mg/dL — ABNORMAL HIGH (ref 65–99)
POTASSIUM: 4.5 mmol/L (ref 3.5–5.1)
Sodium: 137 mmol/L (ref 135–145)
TOTAL PROTEIN: 6.8 g/dL (ref 6.5–8.1)

## 2016-07-05 LAB — CBC WITH DIFFERENTIAL/PLATELET
BASOS ABS: 0 10*3/uL (ref 0–0.1)
Basophils Relative: 1 %
EOS PCT: 0 %
Eosinophils Absolute: 0 10*3/uL (ref 0–0.7)
HCT: 33.4 % — ABNORMAL LOW (ref 35.0–47.0)
Hemoglobin: 10.8 g/dL — ABNORMAL LOW (ref 12.0–16.0)
LYMPHS PCT: 17 %
Lymphs Abs: 1.5 10*3/uL (ref 1.0–3.6)
MCH: 28.2 pg (ref 26.0–34.0)
MCHC: 32.3 g/dL (ref 32.0–36.0)
MCV: 87.3 fL (ref 80.0–100.0)
MONO ABS: 0.6 10*3/uL (ref 0.2–0.9)
MONOS PCT: 7 %
Neutro Abs: 6.7 10*3/uL — ABNORMAL HIGH (ref 1.4–6.5)
Neutrophils Relative %: 75 %
PLATELETS: 190 10*3/uL (ref 150–440)
RBC: 3.83 MIL/uL (ref 3.80–5.20)
RDW: 16.3 % — AB (ref 11.5–14.5)
WBC: 8.8 10*3/uL (ref 3.6–11.0)

## 2016-07-05 LAB — TSH: TSH: 1.901 u[IU]/mL (ref 0.350–4.500)

## 2016-07-05 LAB — TROPONIN I

## 2016-07-05 LAB — VALPROIC ACID LEVEL: VALPROIC ACID LVL: 44 ug/mL — AB (ref 50.0–100.0)

## 2016-07-05 MED ORDER — BUSPIRONE HCL 10 MG PO TABS
10.0000 mg | ORAL_TABLET | Freq: Two times a day (BID) | ORAL | Status: DC
  Administered 2016-07-05 – 2016-07-06 (×2): 10 mg via ORAL
  Filled 2016-07-05 (×2): qty 1

## 2016-07-05 MED ORDER — ACETAMINOPHEN 650 MG RE SUPP: 650.0000 mg | Freq: Four times a day (QID) | RECTAL | Status: DC | PRN

## 2016-07-05 MED ORDER — SODIUM CHLORIDE 0.9% FLUSH
3.0000 mL | Freq: Two times a day (BID) | INTRAVENOUS | Status: DC
  Administered 2016-07-05 – 2016-07-06 (×2): 3 mL via INTRAVENOUS

## 2016-07-05 MED ORDER — DIVALPROEX SODIUM 125 MG PO CSDR
250.0000 mg | DELAYED_RELEASE_CAPSULE | Freq: Two times a day (BID) | ORAL | Status: DC
  Administered 2016-07-06 (×2): 250 mg via ORAL
  Filled 2016-07-05 (×2): qty 2

## 2016-07-05 MED ORDER — ACETAMINOPHEN 325 MG PO TABS: 650.0000 mg | ORAL_TABLET | Freq: Four times a day (QID) | ORAL | Status: DC | PRN

## 2016-07-05 MED ORDER — SODIUM CHLORIDE 0.9 % IV SOLN
INTRAVENOUS | Status: DC
  Administered 2016-07-05: 23:00:00 via INTRAVENOUS

## 2016-07-05 MED ORDER — FLUDROCORTISONE ACETATE 0.1 MG PO TABS
0.2000 mg | ORAL_TABLET | Freq: Every day | ORAL | Status: DC
  Administered 2016-07-06: 0.2 mg via ORAL
  Filled 2016-07-05: qty 2

## 2016-07-05 MED ORDER — ENOXAPARIN SODIUM 30 MG/0.3ML ~~LOC~~ SOLN
30.0000 mg | Freq: Every day | SUBCUTANEOUS | Status: DC
  Administered 2016-07-05: 30 mg via SUBCUTANEOUS
  Filled 2016-07-05: qty 0.3

## 2016-07-05 MED ORDER — ONDANSETRON HCL 4 MG PO TABS: 4.0000 mg | ORAL_TABLET | Freq: Four times a day (QID) | ORAL | Status: DC | PRN

## 2016-07-05 MED ORDER — ONDANSETRON HCL 4 MG/2ML IJ SOLN: 4.0000 mg | Freq: Four times a day (QID) | INTRAMUSCULAR | Status: DC | PRN

## 2016-07-05 MED ORDER — MEMANTINE HCL 10 MG PO TABS: 5.0000 mg | ORAL_TABLET | ORAL | Status: DC

## 2016-07-05 MED ORDER — LIDOCAINE HCL (PF) 1 % IJ SOLN
INTRAMUSCULAR | Status: AC
  Filled 2016-07-05: qty 5

## 2016-07-05 MED ORDER — ASPIRIN 81 MG PO CHEW
81.0000 mg | CHEWABLE_TABLET | Freq: Every day | ORAL | Status: DC
  Administered 2016-07-06: 81 mg via ORAL
  Filled 2016-07-05: qty 1

## 2016-07-05 MED ORDER — SERTRALINE HCL 50 MG PO TABS
50.0000 mg | ORAL_TABLET | Freq: Every day | ORAL | Status: DC
  Administered 2016-07-06: 50 mg via ORAL
  Filled 2016-07-05 (×2): qty 1

## 2016-07-05 NOTE — ED Provider Notes (Addendum)
Time Seen: Approximately *1615  I have reviewed the triage notes  Chief Complaint: Fall   History of Present Illness: [name] is a 82 y.o. female who presents after a fall. The patient had 3 unwitnessed falls today. She does have a history of Alzheimer's dementia and according to the family that arrived earlier she appears to be at her baseline mental status. She has an obvious left-sided frontal contusion from a fall. She also has history of postural hypotension   Past Medical History:  Diagnosis Date  . Alzheimer's dementia   . Anxiety    H/o anxiety, would use as needed diazepam with relief  . Colon cancer [facility])    h/o colon CA 2010- colectomy 2010  (Dr. Lamonte Sakai with oncology)  . Orthostasis    h/o orthostasis, didn't tolerate midodrine, prev eval by Dr. Marlou Porch  . PONV (postoperative nausea and vomiting)   . Thyroid disease    H/o thyroid nodule with benign bx 2010    Patient Active Problem List   Diagnosis Date Noted  . Visual hallucinations 03/28/2013  . Depression 01/12/2013  . Alzheimer's dementia 06/23/2012  . Syncope 12/06/2011  . Other constipation 03/22/2011  . ADENOCARCINOMA, COLON 07/15/2010  . ANXIETY STATE, UNSPECIFIED 07/15/2010  . POSTURAL HYPOTENSION 07/15/2010    Past Surgical History:  Procedure Laterality Date  . ABDOMINAL HYSTERECTOMY    . PARTIAL COLECTOMY  2010   for colon CA  . US ECHOCARDIOGRAPHY  [id number]   normal    Past Surgical History:  Procedure Laterality Date  . ABDOMINAL HYSTERECTOMY    . PARTIAL COLECTOMY  2010   for colon CA  . US ECHOCARDIOGRAPHY  [id number]   normal    Current Outpatient Rx  . Order #: [id number] Class: Historical Med  . Order #: [id number] Class: Historical Med  . Order #: [id number] Class: Historical Med  . Order #: [id number] Class: Historical Med  . Order #: [id number] Class: Historical Med  . Order #: [id number] Class: Historical Med  . Order #: [id number] Class: Historical Med  . Order #:  [id number] Class: Historical Med  . Order #: [id number] Class: Historical Med  . Order #: [id number] Class: Historical Med  . Order #: [id number] Class: Historical Med  . Order #: [id number] Class: Historical Med  . Order #: [id number] Class: Historical Med  . Order #: [id number] Class: No Print  . Order #: [id number] Class: Historical Med  . Order #: [id number] Class: Print  . [START ON 07/08/2016] Order #: [id number] Class: Historical Med  . Order #: [id number] Class: Historical Med  . Order #: [id number] Class: Normal  . Order #: [id number] Class: Historical Med  . Order #: [id number] Class: Historical Med  . Order #: [id number] Class: Historical Med    Allergies:  Aricept [donepezil hcl]; Codeine; Erythromycin; Penicillins; and Sulfonamide derivatives  Family History: History reviewed. No pertinent family history.  Social History: Social History  Substance Use Topics  . Smoking status: Never Smoker  . Smokeless tobacco: Never Used  . Alcohol use No     Comment: rare     Review of Systems:   10 point review of systems was performed and was otherwise negative: Review of systems mainly acquired from the medical record. Outside of the trauma on her head the patient has no other physical complaints Constitutional: No fever Eyes: No visual disturbances ENT: No sore throat, ear pain Cardiac: No chest pain Respiratory: No shortness of breath, wheezing, or stridor Abdomen: No abdominal pain, no vomiting, No diarrhea Endocrine: No weight loss, No night sweats Extremities: No peripheral edema, cyanosis Skin: No rashes,  easy bruising Neurologic: No focal weakness, trouble with speech or swollowing Urologic: No dysuria, Hematuria, or urinary frequency   Physical Exam:  ED Triage Vitals  Enc Vitals Group     BP 07/05/16 1616 129/69     Pulse Rate 07/05/16 1616 70     Resp 07/05/16 1616 16     Temp 07/05/16 1616 97.8 F (36.6 C)     Temp Source 07/05/16 1616 Oral     SpO2 07/05/16 1616 100 %      Weight 07/05/16 1604 135 lb (61.2 kg)     Height 07/05/16 1604 5\' 2"  (1.575 m)     Head Circumference --      Peak Flow --      Pain Score --      Pain Loc --      Pain Edu? --      Excl. in [place]? --     General: Awake , Alert , and Oriented times 1. Glasgow Coma Scale 15 the patient does follow commands. Head: Ecchymotic swollen area left frontal region with a stellate 3 cm laceration. Some lacerations through the skin and slightly into the subcutaneous tissue Eyes: Pupils equal , round, reactive to light Nose/Throat: No nasal drainage, patent upper airway without erythema or exudate.  Neck: Supple, Full range of motion, No anterior adenopathy or palpable thyroid masses. Patient has good flexion, extension without any pain or neuropraxia no focal tenderness over the cervical spine Lungs: Clear to ascultation without wheezes , rhonchi, or rales Heart: Regular rate, regular rhythm without murmurs , gallops , or rubs Abdomen: Soft, non tender without rebound, guarding , or rigidity; bowel sounds positive and symmetric in all 4 quadrants. No organomegaly .        Extremities: 2 plus symmetric pulses. No edema, clubbing or cyanosis Neurologic: normal ambulation, Motor symmetric without deficits, sensory intact Skin: warm, dry, no rashes Examination of thoracic and lumbar spine posteriorly on the back shows no posterior contusions, abrasions or focal tenderness  Labs:   All laboratory work was reviewed including any pertinent negatives or positives listed below:  Labs Reviewed  CBC WITH DIFFERENTIAL/PLATELET - Abnormal; Notable for the following:       Result Value   Hemoglobin 10.8 (*)    HCT 33.4 (*)    RDW 16.3 (*)    Neutro Abs 6.7 (*)    All other components within normal limits  COMPREHENSIVE METABOLIC PANEL - Abnormal; Notable for the following:    Glucose, Bld 120 (*)    BUN 25 (*)    Creatinine, Ser 1.25 (*)    ALT 10 (*)    GFR calc non Af Amer 38 (*)    GFR calc Af Amer  44 (*)    All other components within normal limits  VALPROIC ACID LEVEL - Abnormal; Notable for the following:    Valproic Acid Lvl 44 (*)    All other components within normal limits  TROPONIN I    EKG:  ED ECG REPORT I, Daymon Larsen, the attending physician, personally viewed and interpreted this ECG.  Date: 07/05/2016  time: 1614 Rate: 70 Rhythm: normal sinus rhythm QRS Axis: normal Intervals: normal ST/T Wave abnormalities: normal Conduction Disturbances: none Narrative Interpretation: unremarkable Poor quality EKG No obvious acute ischemic changes  Radiology:  "Ct Head Wo Contrast  Result Date: 07/05/2016 CLINICAL DATA:  Unwitnessed falls today at skilled nursing facility. EXAM: CT HEAD WITHOUT CONTRAST CT CERVICAL SPINE WITHOUT CONTRAST TECHNIQUE: Multidetector CT  imaging of the head and cervical spine was performed following the standard protocol without intravenous contrast. Multiplanar CT image reconstructions of the cervical spine were also generated. COMPARISON:  03/19/2016 FINDINGS: CT HEAD FINDINGS Brain: The brain shows generalized atrophy. There are mild chronic small-vessel ischemic changes of the deep white matter. No cortical or large vessel territory infarction. No mass lesion, hemorrhage, hydrocephalus or extra-axial collection. Vascular: There is atherosclerotic calcification of the major vessels at the base of the brain. Skull: No skull fracture. Sinuses/Orbits: Clear/normal Other: Left frontal scalp hematoma. CT CERVICAL SPINE FINDINGS Alignment: Normal Skull base and vertebrae: Skullbase is normal. There is a linear fracture at the tip of the inferior articular process of the facet on the right at C4. No other fracture is identified. Soft tissues and spinal canal: No soft tissue injury. Chronic atherosclerotic change of the carotid artery's, mild. Multiple cystic thyroid masses bilaterally with intrathoracic extension consistent with goiter. Disc levels:  Degenerative spondylosis at C3-4, C4-5, C5-6 and C6-7 with mild osteophytic encroachment upon the foramina. Degenerative facet arthropathy most pronounced on the right at C3-4, C4-5, C5-6 and C7-T1 and on the left at C4-5. Upper chest: Clear Other: None significant IMPRESSION: Head CT: Left frontal scalp hematoma. No skull fracture. No intracranial injury. Cervical spine CT: Acute or recent fracture at the tip of the inferior articular process of the facet on the right at C4. This is a stable fracture. No other fracture is identified. Chronic thyroid goiter. Electronically Signed   By: Nelson Chimes M.D.   On: 07/05/2016 16:50   Ct Cervical Spine Wo Contrast  Result Date: 07/05/2016 CLINICAL DATA:  Unwitnessed falls today at skilled nursing facility. EXAM: CT HEAD WITHOUT CONTRAST CT CERVICAL SPINE WITHOUT CONTRAST TECHNIQUE: Multidetector CT imaging of the head and cervical spine was performed following the standard protocol without intravenous contrast. Multiplanar CT image reconstructions of the cervical spine were also generated. COMPARISON:  03/19/2016 FINDINGS: CT HEAD FINDINGS Brain: The brain shows generalized atrophy. There are mild chronic small-vessel ischemic changes of the deep white matter. No cortical or large vessel territory infarction. No mass lesion, hemorrhage, hydrocephalus or extra-axial collection. Vascular: There is atherosclerotic calcification of the major vessels at the base of the brain. Skull: No skull fracture. Sinuses/Orbits: Clear/normal Other: Left frontal scalp hematoma. CT CERVICAL SPINE FINDINGS Alignment: Normal Skull base and vertebrae: Skullbase is normal. There is a linear fracture at the tip of the inferior articular process of the facet on the right at C4. No other fracture is identified. Soft tissues and spinal canal: No soft tissue injury. Chronic atherosclerotic change of the carotid artery's, mild. Multiple cystic thyroid masses bilaterally with intrathoracic  extension consistent with goiter. Disc levels: Degenerative spondylosis at C3-4, C4-5, C5-6 and C6-7 with mild osteophytic encroachment upon the foramina. Degenerative facet arthropathy most pronounced on the right at C3-4, C4-5, C5-6 and C7-T1 and on the left at C4-5. Upper chest: Clear Other: None significant IMPRESSION: Head CT: Left frontal scalp hematoma. No skull fracture. No intracranial injury. Cervical spine CT: Acute or recent fracture at the tip of the inferior articular process of the facet on the right at C4. This is a stable fracture. No other fracture is identified. Chronic thyroid goiter. Electronically Signed   By: Nelson Chimes M.D.   On: 07/05/2016 16:50  " I personally reviewed the radiologic studies   Procedures:  3 cm simple closure of left-sided facial laceration. Area was prepped and draped in sterile fashion and anesthetized locally with  1% lidocaine without epinephrine. 2 interrupted 4-0 Prolene sutures were established and the patient will receive a Steri-Strip placed across the wound. The patient appeared to tolerate the procedure well.    ED Course: Patient's stay here was uneventful and she exhibits no focal neurologic symptoms. I felt the CAT scan abnormalities cited in the cervical spine and had absolutely no clinical significance. The patient appears to be at her baseline mental status and will be discharged back to the memory unit. The family has agreed to transport the patient back to the facility. We discussed her history of postural hypotension and advised them they may want to review this with the nursing home physician to see if any adjustments need to be made on her medication. The patient at time of discharge continued to have symptoms consistent with orthostatic hypotension where she was symptomatic whenever she tried to stand up and ambulate. Due to the recurrent falls family was concerned and taking her back to the facility. Clinical Course       Assessment:  Status post non-syncopal fall with acute closed head injury 3 cm non-complicated laceration with closure   Final Clinical Impression:   Final diagnoses:  Injury of head, initial encounter  Laceration of left periocular area without foreign body, initial encounter     Plan:  inpatient          Daymon Larsen, MD 07/05/16 1902    Daymon Larsen, MD 07/05/16 2033

## 2016-07-05 NOTE — ED Triage Notes (Signed)
Per [facility]: pt. From [place] with 3 unwitnessed falls today. VS stable in route, CBG 136. Pt. C/o pain to left FH

## 2016-07-05 NOTE — ED Notes (Signed)
MD Quigley at bedside. 

## 2016-07-05 NOTE — Progress Notes (Signed)
The Nurse paged the [facility] for code blue Pt, who had taken a fall and was being examined in the ED Rm 25. [place] visited Pt. Pt was wake but did not speak to [facility] or her niece, who was present in the room. [place] asked the niece if there was anything she wanted water or coffee, she said no, she did not need anything. However, she requested for prayers, which the [facility] offered for the Pt and the niece. Cameron promised to follow-up this Pt at a later.   07/05/16 1900  Clinical Encounter Type  Visited With Patient and family together  Visit Type Initial;Spiritual support;Code;ED  Referral From Nurse  Consult/Referral To Chaplain  Spiritual Encounters  Spiritual Needs Prayer;Other (Comment)  Stress Factors  Patient Stress Factors Exhausted  Family Stress Factors Other (Comment)

## 2016-07-05 NOTE — ED Notes (Signed)
Code  Stroke  Called  To 333 

## 2016-07-05 NOTE — H&P (Signed)
[facility] [place] NAME: [name]    MR#:  [id number]  DATE OF BIRTH:  21-Feb-1928  DATE OF ADMISSION:  07/05/2016  PRIMARY CARE PHYSICIAN: No PCP Per Patient   REQUESTING/REFERRING PHYSICIAN: Meade Maw MD  CHIEF COMPLAINT:   Chief Complaint  Patient presents with  . Fall    HISTORY OF PRESENT ILLNESS: [name]  is a 82 y.o. female with a known history of Alzheimer's dementia, anxiety, colon cancer, chronic orthostatic hypotension and an recurrent falls who is brought in with 3 falls today. According to her daughter who is at bedside over the past 1 week patient has had worsening of frequencies of fall. Every time she gets up she feels like she may pass out. Patient has chronic orthostatic hypotension and has not tolerated Esmond Camper is currently on Florinef but also has lower extremity swelling chronically. She is also being treated for fluid retention of her lower extremity with Lasix. PAST MEDICAL HISTORY:   Past Medical History:  Diagnosis Date  . Alzheimer's dementia   . Anxiety    H/o anxiety, would use as needed diazepam with relief  . Colon cancer [facility])    h/o colon CA 2010- colectomy 2010  (Dr. Lamonte Sakai with oncology)  . Orthostasis    h/o orthostasis, didn't tolerate midodrine, prev eval by Dr. Marlou Porch  . PONV (postoperative nausea and vomiting)   . Thyroid disease    H/o thyroid nodule with benign bx 2010    PAST SURGICAL HISTORY: Past Surgical History:  Procedure Laterality Date  . ABDOMINAL HYSTERECTOMY    . PARTIAL COLECTOMY  2010   for colon CA  . US ECHOCARDIOGRAPHY  [id number]   normal    SOCIAL HISTORY:  Social History  Substance Use Topics  . Smoking status: Never Smoker  . Smokeless tobacco: Never Used  . Alcohol use No     Comment: rare    FAMILY HISTORY: History reviewed. No pertinent family history.  DRUG ALLERGIES:  Allergies  Allergen Reactions  . Aricept [Donepezil Hcl]      Increased hallucinations   . Codeine   . Erythromycin     REACTION: GI upset  . Penicillins Other (See Comments)    Has patient had a PCN reaction causing immediate rash, facial/tongue/throat swelling, SOB or lightheadedness with hypotension: UNK  Has patient had a PCN reaction causing severe rash involving mucus membranes or skin necrosis: UNK Has patient had a PCN reaction that required hospitalization: UNK Has patient had a PCN reaction occurring within the last 10 years: UNK If all of the above answers are "NO", then may proceed with Cephalosporin use. ** ALLERGY LISTED ON MAR, UNKNOWN REACTION**  . Sulfonamide Derivatives     REACTION: GI upset    REVIEW OF SYSTEMS:  Unable to provide    MEDICATIONS AT HOME:  Prior to Admission medications   Voltaren 1% gel to right knee 2 times daily Silace 71ml po bid BuSpar 10 mg 1 tab by mouth daily Depakote 150 mg 2 capsules twice a day Namenda 5 mg 1 tab by mouth daily Zoloft 50 daily Melatonin 3 mg daily  vitamin D 2000 IUs daily Potassium chloride 10% 20 mEq daily Lasix 20 mg half tab every day Florinef 0.1 mg daily Culturelle capsule one tab every day Aspirin 81 one tab by mouth daily   PHYSICAL EXAMINATION:   VITAL SIGNS: Blood pressure 125/66, pulse 61, temperature 97.8 F (36.6 C), temperature source  Oral, resp. rate 12, height 5\' 2"  (1.575 m), weight 135 lb (61.2 kg), SpO2 97 %.  GENERAL:  82 y.o.-year-old patient lying in the bed with no acute distress.  EYES: Pupils equal, round, reactive to light and accommodation. No scleral icterus. Extraocular muscles intact.  HEENT: Head atraumatic, normocephalic. Oropharynx and nasopharynx clear.  NECK:  Supple, no jugular venous distention. No thyroid enlargement, no tenderness.  LUNGS: Normal breath sounds bilaterally, no wheezing, rales,rhonchi or crepitation. No use of accessory muscles of respiration.  CARDIOVASCULAR: S1, S2 normal. No murmurs, rubs, or gallops.  ABDOMEN:  Soft, nontender, nondistended. Bowel sounds present. No organomegaly or mass.  EXTREMITIES: No pedal edema, cyanosis, or clubbing.  NEUROLOGIC: Cranial nerves II through XII are intact. Muscle strength 5/5 in all extremities. Sensation intact. Gait not checked.  PSYCHIATRIC: Patient is awake but not oriented to place person or time SKIN: Skin tear on the forehead  LABORATORY PANEL:   CBC  Recent Labs Lab 07/05/16 1616  WBC 8.8  HGB 10.8*  HCT 33.4*  PLT 190  MCV 87.3  MCH 28.2  MCHC 32.3  RDW 16.3*  LYMPHSABS 1.5  MONOABS 0.6  EOSABS 0.0  BASOSABS 0.0   ------------------------------------------------------------------------------------------------------------------  Chemistries   Recent Labs Lab 07/05/16 1616  NA 137  K 4.5  CL 102  CO2 30  GLUCOSE 120*  BUN 25*  CREATININE 1.25*  CALCIUM 9.0  AST 18  ALT 10*  ALKPHOS 77  BILITOT 0.5   ------------------------------------------------------------------------------------------------------------------ estimated creatinine clearance is 27.8 mL/min (by C-G formula based on SCr of 1.25 mg/dL (H)). ------------------------------------------------------------------------------------------------------------------ No results for input(s): TSH, T4TOTAL, T3FREE, THYROIDAB in the last 72 hours.  Invalid input(s): FREET3   Coagulation profile No results for input(s): INR, PROTIME in the last 168 hours. ------------------------------------------------------------------------------------------------------------------- No results for input(s): DDIMER in the last 72 hours. -------------------------------------------------------------------------------------------------------------------  Cardiac Enzymes  Recent Labs Lab 07/05/16 1616  TROPONINI <0.03   ------------------------------------------------------------------------------------------------------------------ Invalid input(s):  POCBNP  ---------------------------------------------------------------------------------------------------------------  Urinalysis    Component Value Date/Time   COLORURINE YELLOW 08/02/2014 1000   COLORURINE YELLOW 08/03/2013 1321   APPEARANCEUR HAZY 08/02/2014 1000   LABSPEC 1.025 08/02/2014 1000   PHURINE 5.0 08/02/2014 1000   PHURINE 7.0 08/03/2013 1321   GLUCOSEU NEGATIVE 08/02/2014 1000   HGBUR NEGATIVE 08/02/2014 1000   HGBUR TRACE (A) 08/03/2013 1321   BILIRUBINUR NEGATIVE 08/02/2014 1000   KETONESUR 1+ 08/02/2014 1000   KETONESUR NEGATIVE 08/03/2013 1321   PROTEINUR 30 mg/dL 08/02/2014 1000   PROTEINUR NEGATIVE 08/03/2013 1321   UROBILINOGEN 0.2 08/03/2013 1321   NITRITE NEGATIVE 08/02/2014 1000   NITRITE NEGATIVE 08/03/2013 1321   LEUKOCYTESUR NEGATIVE 08/02/2014 1000     RADIOLOGY: Ct Head Wo Contrast  Result Date: 07/05/2016 CLINICAL DATA:  Unwitnessed falls today at skilled nursing facility. EXAM: CT HEAD WITHOUT CONTRAST CT CERVICAL SPINE WITHOUT CONTRAST TECHNIQUE: Multidetector CT imaging of the head and cervical spine was performed following the standard protocol without intravenous contrast. Multiplanar CT image reconstructions of the cervical spine were also generated. COMPARISON:  03/19/2016 FINDINGS: CT HEAD FINDINGS Brain: The brain shows generalized atrophy. There are mild chronic small-vessel ischemic changes of the deep white matter. No cortical or large vessel territory infarction. No mass lesion, hemorrhage, hydrocephalus or extra-axial collection. Vascular: There is atherosclerotic calcification of the major vessels at the base of the brain. Skull: No skull fracture. Sinuses/Orbits: Clear/normal Other: Left frontal scalp hematoma. CT CERVICAL SPINE FINDINGS Alignment: Normal Skull base and vertebrae: Skullbase is normal. There is  a linear fracture at the tip of the inferior articular process of the facet on the right at C4. No other fracture is identified.  Soft tissues and spinal canal: No soft tissue injury. Chronic atherosclerotic change of the carotid artery's, mild. Multiple cystic thyroid masses bilaterally with intrathoracic extension consistent with goiter. Disc levels: Degenerative spondylosis at C3-4, C4-5, C5-6 and C6-7 with mild osteophytic encroachment upon the foramina. Degenerative facet arthropathy most pronounced on the right at C3-4, C4-5, C5-6 and C7-T1 and on the left at C4-5. Upper chest: Clear Other: None significant IMPRESSION: Head CT: Left frontal scalp hematoma. No skull fracture. No intracranial injury. Cervical spine CT: Acute or recent fracture at the tip of the inferior articular process of the facet on the right at C4. This is a stable fracture. No other fracture is identified. Chronic thyroid goiter. Electronically Signed   By: Nelson Chimes M.D.   On: 07/05/2016 16:50   Ct Cervical Spine Wo Contrast  Result Date: 07/05/2016 CLINICAL DATA:  Unwitnessed falls today at skilled nursing facility. EXAM: CT HEAD WITHOUT CONTRAST CT CERVICAL SPINE WITHOUT CONTRAST TECHNIQUE: Multidetector CT imaging of the head and cervical spine was performed following the standard protocol without intravenous contrast. Multiplanar CT image reconstructions of the cervical spine were also generated. COMPARISON:  03/19/2016 FINDINGS: CT HEAD FINDINGS Brain: The brain shows generalized atrophy. There are mild chronic small-vessel ischemic changes of the deep white matter. No cortical or large vessel territory infarction. No mass lesion, hemorrhage, hydrocephalus or extra-axial collection. Vascular: There is atherosclerotic calcification of the major vessels at the base of the brain. Skull: No skull fracture. Sinuses/Orbits: Clear/normal Other: Left frontal scalp hematoma. CT CERVICAL SPINE FINDINGS Alignment: Normal Skull base and vertebrae: Skullbase is normal. There is a linear fracture at the tip of the inferior articular process of the facet on the right  at C4. No other fracture is identified. Soft tissues and spinal canal: No soft tissue injury. Chronic atherosclerotic change of the carotid artery's, mild. Multiple cystic thyroid masses bilaterally with intrathoracic extension consistent with goiter. Disc levels: Degenerative spondylosis at C3-4, C4-5, C5-6 and C6-7 with mild osteophytic encroachment upon the foramina. Degenerative facet arthropathy most pronounced on the right at C3-4, C4-5, C5-6 and C7-T1 and on the left at C4-5. Upper chest: Clear Other: None significant IMPRESSION: Head CT: Left frontal scalp hematoma. No skull fracture. No intracranial injury. Cervical spine CT: Acute or recent fracture at the tip of the inferior articular process of the facet on the right at C4. This is a stable fracture. No other fracture is identified. Chronic thyroid goiter. Electronically Signed   By: Nelson Chimes M.D.   On: 07/05/2016 16:50    EKG: Orders placed or performed during the hospital encounter of 03/15/16  . EKG 12-Lead  . EKG 12-Lead    IMPRESSION AND PLAN: Patient is a 82 year old white female with orthostatic hypotension with recurrent falls  1. Recurrent falls : due to orthostatic hypotention I will give her fluids over night. I will increase patients florinef dose to florinef 0.2 mg by mouth daily I will hold her Lasix I will place her on TED hose We'll obtain echocardiogram if sure she doesn't have any valvular disease contributing to some of these symptoms  2. Dementia Continue Namenda  3. Depression and anxiety continue Depakote and BuSpar and Zoloft  4. Chronic lower extremity swelling Will hold Lasix, TED hose at all times  5. CODE STATUS patient is a DO NOT RESUSCITATE confirmed with  daughter    All the records are reviewed and case discussed with ED provider. Management plans discussed with the patient, family and they are in agreement.  CODE STATUS: Code Status History    Date Active Date Inactive Code Status  Order ID Comments User Context   08/03/2013  3:40 PM 08/07/2013  7:05 PM Full Code [id number]  Costin Karlyne Greenspan, MD ED       TOTAL TIME TAKING CARE OF THIS PATIENT: 55 minutes.    Dustin Flock M.D on 07/05/2016 at 8:21 PM  Between 7am to 6pm - Pager - [phone]  After 6pm go to [email or web address] - password [email or web address] [facility]  [facility]  Office  [phone]  CC: Primary care physician; No PCP Per Patient

## 2016-07-05 NOTE — Progress Notes (Signed)
Pharmacist - Prescriber Communication  Lovenox 40 mg subcutaneously every 24 hours has been changed to Lovenox 30 mg subcutaneously every 24 hours due to creatinine clearance less than 30 mL/min according to the Cockcroft-Gault equation.   Fiza Nation A. Barron, [place].D., BCPS Clinical Pharmacist 07/05/2016 2149

## 2016-07-06 ENCOUNTER — Observation Stay: Payer: Medicare Other

## 2016-07-06 ENCOUNTER — Observation Stay
Admit: 2016-07-06 | Discharge: 2016-07-06 | Disposition: A | Discharge status: Home / Self Care | Payer: Medicare Other | Attending: Internal Medicine | Admitting: Internal Medicine

## 2016-07-06 DIAGNOSIS — S0003XA Contusion of scalp, initial encounter: Secondary | ICD-10-CM | POA: Diagnosis not present

## 2016-07-06 LAB — BASIC METABOLIC PANEL
ANION GAP: 6 (ref 5–15)
BUN: 20 mg/dL (ref 6–20)
CO2: 30 mmol/L (ref 22–32)
Calcium: 8.3 mg/dL — ABNORMAL LOW (ref 8.9–10.3)
Chloride: 105 mmol/L (ref 101–111)
Creatinine, Ser: 0.8 mg/dL (ref 0.44–1.00)
Glucose, Bld: 86 mg/dL (ref 65–99)
POTASSIUM: 3.9 mmol/L (ref 3.5–5.1)
SODIUM: 141 mmol/L (ref 135–145)

## 2016-07-06 LAB — CBC
HCT: 29 % — ABNORMAL LOW (ref 35.0–47.0)
Hemoglobin: 9.6 g/dL — ABNORMAL LOW (ref 12.0–16.0)
MCH: 29 pg (ref 26.0–34.0)
MCHC: 33.1 g/dL (ref 32.0–36.0)
MCV: 87.6 fL (ref 80.0–100.0)
PLATELETS: 149 10*3/uL — AB (ref 150–440)
RBC: 3.31 MIL/uL — AB (ref 3.80–5.20)
RDW: 16.6 % — ABNORMAL HIGH (ref 11.5–14.5)
WBC: 5.8 10*3/uL (ref 3.6–11.0)

## 2016-07-06 LAB — ECHOCARDIOGRAM COMPLETE
HEIGHTINCHES: 62 in
WEIGHTICAEL: 2288 [oz_av]

## 2016-07-06 MED ORDER — QUETIAPINE FUMARATE 25 MG PO TABS: 25.0000 mg | ORAL_TABLET | Freq: Every day | ORAL | 0 refills | Status: DC

## 2016-07-06 MED ORDER — FLUDROCORTISONE ACETATE 0.1 MG PO TABS: 0.1000 mg | ORAL_TABLET | Freq: Two times a day (BID) | ORAL | 0 refills | Status: DC

## 2016-07-06 NOTE — Care Management Obs Status (Signed)
[facility] NOTIFICATION   Patient Details  Name: [name] MRN: [id number] Date of Birth: September 11, 1927   Medicare Observation Status Notification Given:  No (admitted observation less than 24 hours)    Beverly Sessions, RN 07/06/2016, 3:07 PM

## 2016-07-06 NOTE — Discharge Planning (Signed)
Patient  IV and tele removed.  XRAY of Rt knee results showed nothing acute or fractured.  RN assessment and VS revealed stability for DC to facility.  Report called to Otto Herb at [facility].  Patient's daughter informed of discharge and also agreed to have EMS transport.  EMS called and waiting for arrival.

## 2016-07-06 NOTE — Discharge Summary (Signed)
[facility] [place] NAME: [name]    MR#:  [id number]  DATE OF BIRTH:  1928-08-08  DATE OF ADMISSION:  07/05/2016 ADMITTING PHYSICIAN: Dustin Flock, MD  DATE OF DISCHARGE: 07/06/16  PRIMARY CARE PHYSICIAN: No PCP Per Patient    ADMISSION DIAGNOSIS:  Injury of head, initial encounter [S09.90XA] Laceration of left periocular area without foreign body, initial encounter [id number]  DISCHARGE DIAGNOSIS:  Recurrent falls Left frontal hematoma H/o orthostatic hypotension Dementia SECONDARY DIAGNOSIS:   Past Medical History:  Diagnosis Date  . Alzheimer's dementia   . Anxiety    H/o anxiety, would use as needed diazepam with relief  . Colon cancer [facility])    h/o colon CA 2010- colectomy 2010  (Dr. Lamonte Sakai with oncology)  . Orthostasis    h/o orthostasis, didn't tolerate midodrine, prev eval by Dr. Marlou Porch  . PONV (postoperative nausea and vomiting)   . Thyroid disease    H/o thyroid nodule with benign bx 2010    HOSPITAL COURSE:  82 year old white female with orthostatic hypotension with recurrent falls  1. Recurrent falls : due to orthostatic hypotention Pt received fluids over night.BP stable  increased patients florinef dose to florinef 0.2 mg by mouth daily  will hold her Lasix - on TED hose --Echo EF 555 no acute abnormality noted -CT head no fracture but postive for frontal hematoma,left CT cervical spine mild fracture over the C4 inf articulate process--this was d/w Dr Lajuana Ripple at [facility] neuro[facility]--no recommendations for collar or any further w/u  2. Dementia Continue Namenda  3. Depression and anxiety continue Depakote and BuSpar and Zoloft  4. Chronic lower extremity swelling Will hold Lasix, TED hose at all times  5. CODE STATUS patient is a DO NOT RESUSCITATE confirmed with daughter  D/c back to [facility] Right knee xrays to be done  CONSULTS OBTAINED:    DRUG ALLERGIES:   Allergies   Allergen Reactions  . Aricept [Donepezil Hcl]     Increased hallucinations   . Codeine   . Erythromycin     REACTION: GI upset  . Penicillins Other (See Comments)    Has patient had a PCN reaction causing immediate rash, facial/tongue/throat swelling, SOB or lightheadedness with hypotension: UNK  Has patient had a PCN reaction causing severe rash involving mucus membranes or skin necrosis: UNK Has patient had a PCN reaction that required hospitalization: UNK Has patient had a PCN reaction occurring within the last 10 years: UNK If all of the above answers are "NO", then may proceed with Cephalosporin use. ** ALLERGY LISTED ON MAR, UNKNOWN REACTION**  . Sulfonamide Derivatives     REACTION: GI upset    DISCHARGE MEDICATIONS:   Current Discharge Medication List    CONTINUE these medications which have CHANGED   Details  fludrocortisone (FLORINEF) 0.1 MG tablet Take 1 tablet (0.1 mg total) by mouth 2 (two) times daily. Qty: 30 tablet, Refills: 0    QUEtiapine (SEROQUEL) 25 MG tablet Take 1 tablet (25 mg total) by mouth at bedtime. Reported on 03/19/2016 Qty: 30 tablet, Refills: 0      CONTINUE these medications which have NOT CHANGED   Details  acidophilus (RISAQUAD) CAPS capsule Take 1 capsule by mouth daily.    aspirin 81 MG chewable tablet Chew 81 mg by mouth daily.    busPIRone (BUSPAR) 10 MG tablet Take 10 mg by mouth 2 (two) times daily.     Cholecalciferol (VITAMIN D3 SUPER STRENGTH) 2000 units  CAPS Take 2,000 Units by mouth daily.    diclofenac sodium (VOLTAREN) 1 % GEL Apply 4 g topically 2 (two) times daily. Apply to right knee    divalproex (DEPAKOTE SPRINKLE) 125 MG capsule Take 250 mg by mouth 2 (two) times daily.     Docusate Sodium 150 MG/15ML syrup Take 100 mg by mouth 2 (two) times daily.    ferrous sulfate 325 (65 FE) MG tablet Take 325 mg by mouth daily with breakfast.    Melatonin 3 MG TABS Take 3 mg by mouth at bedtime.    !! memantine (NAMENDA) 5  MG tablet Take 5 mg by mouth daily. Taper dose, decrease to 5mg  daily for 4 days.    Multiple Vitamin (MULTIVITAMIN WITH MINERALS) TABS tablet Take 1 tablet by mouth daily.    potassium chloride 20 MEQ/15ML (10%) solution Take 22.5 mLs (30 mEq total) by mouth daily. Qty: 500 mL, Refills: 0    !! memantine (NAMENDA) 5 MG tablet Take 5 mg by mouth every other day. Taper dose, decrease to 5mg  every other day for 4 days then STOP     !! - Potential duplicate medications found. Please discuss with provider.    STOP taking these medications     ciprofloxacin (CIPRO) 250 MG tablet      LORazepam (ATIVAN) 0.5 MG tablet      neomycin-bacitracin-polymyxin (NEOSPORIN) ophthalmic ointment      polyethylene glycol (MIRALAX / GLYCOLAX) packet      traZODone (DESYREL) 50 MG tablet         If you experience worsening of your admission symptoms, develop shortness of breath, life threatening emergency, suicidal or homicidal thoughts you must seek medical attention immediately by calling 911 or calling your MD immediately  if symptoms less severe.  You Must read complete instructions/literature along with all the possible adverse reactions/side effects for all the Medicines you take and that have been prescribed to you. Take any new Medicines after you have completely understood and accept all the possible adverse reactions/side effects.   Please note  You were cared for by a hospitalist during your hospital stay. If you have any questions about your discharge medications or the care you received while you were in the hospital after you are discharged, you can call the unit and asked to speak with the hospitalist on call if the hospitalist that took care of you is not available. Once you are discharged, your primary care physician will handle any further medical issues. Please note that NO REFILLS for any discharge medications will be authorized once you are discharged, as it is imperative that you  return to your primary care physician (or establish a relationship with a primary care physician if you do not have one) for your aftercare needs so that they can reassess your need for medications and monitor your lab values. Today   SUBJECTIVE  confused   VITAL SIGNS:  Blood pressure 108/85, pulse 100, temperature 97.7 F (36.5 C), temperature source Oral, resp. rate 17, height 5\' 2"  (1.575 m), weight 64.9 kg (143 lb), SpO2 96 %.  I/O:    Intake/Output Summary (Last 24 hours) at 07/06/16 1311 Last data filed at 07/06/16 0918  Gross per 24 hour  Intake              593 ml  Output                0 ml  Net  593 ml    PHYSICAL EXAMINATION:  GENERAL:  82 y.o.-year-old patient lying in the bed with no acute distress.  EYES: Pupils equal, round, reactive to light and accommodation. No scleral icterus. Extraocular muscles intact. Left frontal hemaotm HEENT: Head atraumatic, normocephalic. Oropharynx and nasopharynx clear.  NECK:  Supple, no jugular venous distention. No thyroid enlargement, no tenderness.  LUNGS: Normal breath sounds bilaterally, no wheezing, rales,rhonchi or crepitation. No use of accessory muscles of respiration.  CARDIOVASCULAR: S1, S2 normal. No murmurs, rubs, or gallops.  ABDOMEN: Soft, non-tender, non-distended. Bowel sounds present. No organomegaly or mass.  EXTREMITIES: No pedal edema, cyanosis, or clubbing.  NEUROLOGIC: Cranial nerves II through XII are intact. Muscle strength 5/5 in all extremities. Sensation intact. Gait not checked.  PSYCHIATRIC: The patient is alert and oriented x 3.  SKIN: No obvious rash, lesion, or ulcer. brusies + in ue and LE  DATA REVIEW:   CBC   Recent Labs Lab 07/06/16 0519  WBC 5.8  HGB 9.6*  HCT 29.0*  PLT 149*    Chemistries   Recent Labs Lab 07/05/16 1616 07/06/16 0519  NA 137 141  K 4.5 3.9  CL 102 105  CO2 30 30  GLUCOSE 120* 86  BUN 25* 20  CREATININE 1.25* 0.80  CALCIUM 9.0 8.3*  AST 18   --   ALT 10*  --   ALKPHOS 77  --   BILITOT 0.5  --     Microbiology Results   No results found for this or any previous visit (from the past 240 hour(s)).  RADIOLOGY:  Ct Head Wo Contrast  Result Date: 07/05/2016 CLINICAL DATA:  Unwitnessed falls today at skilled nursing facility. EXAM: CT HEAD WITHOUT CONTRAST CT CERVICAL SPINE WITHOUT CONTRAST TECHNIQUE: Multidetector CT imaging of the head and cervical spine was performed following the standard protocol without intravenous contrast. Multiplanar CT image reconstructions of the cervical spine were also generated. COMPARISON:  03/19/2016 FINDINGS: CT HEAD FINDINGS Brain: The brain shows generalized atrophy. There are mild chronic small-vessel ischemic changes of the deep white matter. No cortical or large vessel territory infarction. No mass lesion, hemorrhage, hydrocephalus or extra-axial collection. Vascular: There is atherosclerotic calcification of the major vessels at the base of the brain. Skull: No skull fracture. Sinuses/Orbits: Clear/normal Other: Left frontal scalp hematoma. CT CERVICAL SPINE FINDINGS Alignment: Normal Skull base and vertebrae: Skullbase is normal. There is a linear fracture at the tip of the inferior articular process of the facet on the right at C4. No other fracture is identified. Soft tissues and spinal canal: No soft tissue injury. Chronic atherosclerotic change of the carotid artery's, mild. Multiple cystic thyroid masses bilaterally with intrathoracic extension consistent with goiter. Disc levels: Degenerative spondylosis at C3-4, C4-5, C5-6 and C6-7 with mild osteophytic encroachment upon the foramina. Degenerative facet arthropathy most pronounced on the right at C3-4, C4-5, C5-6 and C7-T1 and on the left at C4-5. Upper chest: Clear Other: None significant IMPRESSION: Head CT: Left frontal scalp hematoma. No skull fracture. No intracranial injury. Cervical spine CT: Acute or recent fracture at the tip of the inferior  articular process of the facet on the right at C4. This is a stable fracture. No other fracture is identified. Chronic thyroid goiter. Electronically Signed   By: Nelson Chimes M.D.   On: 07/05/2016 16:50   Ct Cervical Spine Wo Contrast  Result Date: 07/05/2016 CLINICAL DATA:  Unwitnessed falls today at skilled nursing facility. EXAM: CT HEAD WITHOUT CONTRAST CT CERVICAL SPINE WITHOUT  CONTRAST TECHNIQUE: Multidetector CT imaging of the head and cervical spine was performed following the standard protocol without intravenous contrast. Multiplanar CT image reconstructions of the cervical spine were also generated. COMPARISON:  03/19/2016 FINDINGS: CT HEAD FINDINGS Brain: The brain shows generalized atrophy. There are mild chronic small-vessel ischemic changes of the deep white matter. No cortical or large vessel territory infarction. No mass lesion, hemorrhage, hydrocephalus or extra-axial collection. Vascular: There is atherosclerotic calcification of the major vessels at the base of the brain. Skull: No skull fracture. Sinuses/Orbits: Clear/normal Other: Left frontal scalp hematoma. CT CERVICAL SPINE FINDINGS Alignment: Normal Skull base and vertebrae: Skullbase is normal. There is a linear fracture at the tip of the inferior articular process of the facet on the right at C4. No other fracture is identified. Soft tissues and spinal canal: No soft tissue injury. Chronic atherosclerotic change of the carotid artery's, mild. Multiple cystic thyroid masses bilaterally with intrathoracic extension consistent with goiter. Disc levels: Degenerative spondylosis at C3-4, C4-5, C5-6 and C6-7 with mild osteophytic encroachment upon the foramina. Degenerative facet arthropathy most pronounced on the right at C3-4, C4-5, C5-6 and C7-T1 and on the left at C4-5. Upper chest: Clear Other: None significant IMPRESSION: Head CT: Left frontal scalp hematoma. No skull fracture. No intracranial injury. Cervical spine CT: Acute or  recent fracture at the tip of the inferior articular process of the facet on the right at C4. This is a stable fracture. No other fracture is identified. Chronic thyroid goiter. Electronically Signed   By: Nelson Chimes M.D.   On: 07/05/2016 16:50     Management plans discussed with the patient, family and they are in agreement.  CODE STATUS:     Code Status Orders        Start     Ordered   07/05/16 2149  Do not attempt resuscitation (DNR)  Continuous    Question Answer Comment  In the event of cardiac or respiratory ARREST Do not call a "code blue"   In the event of cardiac or respiratory ARREST Do not perform Intubation, CPR, defibrillation or ACLS   In the event of cardiac or respiratory ARREST Use medication by any route, position, wound care, and other measures to relive pain and suffering. May use oxygen, suction and manual treatment of airway obstruction as needed for comfort.      07/05/16 2148    Code Status History    Date Active Date Inactive Code Status Order ID Comments User Context   08/03/2013  3:40 PM 08/07/2013  7:05 PM Full Code [id number]  Costin Karlyne Greenspan, MD ED    Advance Directive Documentation   Flowsheet Row Most Recent Value  Type of Advance Directive  Healthcare Power of Attorney  Pre-existing out of facility DNR order (yellow form or pink MOST form)  No data  "MOST" Form in Place?  No data      TOTAL TIME TAKING CARE OF THIS PATIENT: 40 minutes.    Chiyoko Torrico M.D on 07/06/2016 at 1:11 PM  Between 7am to 6pm - Pager - [phone] After 6pm go to [email or web address] - password [email or web address] [facility]  [place] [facility]  Office  [phone]  CC: Primary care physician; No PCP Per Patient

## 2016-07-06 NOTE — Progress Notes (Signed)
Physical Therapy Treatment Patient Details Name: [name] MRN: [id number] DOB: 1927/10/27 Today's Date: 07/06/2016    History of Present Illness Pt is a 82 y/o F brought in with 3 falls today and with recent h/o recurrent falls. CT cervical spine showed L frontal scal hematoma, no skull fx, +acute or recent fx at tip of inferior articular process of facet of R C4 with conservative managment and no need for brace.  X-ray of R knee with no acute osseous injury.  Pt's PMH includes Alzheimer's dementia, anxiety, colon cancer, chronic orthostatic hypotension.      PT Comments    Pt admitted with above diagnosis. Pt currently with functional limitations due to the deficits listed below (see PT Problem List).  Pt is from a SNF in the memory care unit with frequent falls due to chronic h/o orthostatic hypotension.  BP supine at start of session 122/90, seated in bed 126/93, standing at bedside 98/66 and pt very symptomatic.  Recommending that once pt medically stable, return to SNF in the memory care unit. Pt will benefit from skilled PT to increase their independence and safety with mobility to allow discharge to the venue listed below.     Follow Up Recommendations  SNF (Memory Care Unit)     Equipment Recommendations  Other (comment) (TBD at next venue of care)    Recommendations for Other Services       Precautions / Restrictions Precautions Precautions: Fall;Other (comment) Precaution Comments: +orthostatic Restrictions Weight Bearing Restrictions: No    Mobility  Bed Mobility Overal bed mobility: Needs Assistance Bed Mobility: Supine to Sit;Sit to Supine     Supine to sit: Min assist;HOB elevated Sit to supine: +2 for physical assistance;Max assist   General bed mobility comments: Min assist to scoot to EOB and to elevate trunk.  Pt does not consistently follow commands.  Assist to return to supine once onset of orthostatic hypotension symptoms.  Transfers Overall  transfer level: Needs assistance Equipment used: 2 person hand held assist Transfers: Sit to/from Stand Sit to Stand: Mod assist;+2 safety/equipment;+2 physical assistance         General transfer comment: +2 mod assist to stand from bed and to maintain standing when orthostatic hypotension symptoms present after ~1 minute of standing.  No nystagmus noted.  Pt presents with jerky movements of BUEs/LEs which resolve once placed in trendelenberg.  Attempted sit<>stand from bed x2.  Ambulation/Gait             General Gait Details: unable to assess   Stairs            Wheelchair Mobility    Modified Rankin (Stroke Patients Only)       Balance Overall balance assessment: Needs assistance;History of Falls Sitting-balance support: Single extremity supported;Feet supported Sitting balance-Leahy Scale: Poor Sitting balance - Comments: Needed at least 1 UE supported sitting EOB   Standing balance support: Bilateral upper extremity supported;During functional activity Standing balance-Leahy Scale: Poor Standing balance comment: Relies on BUE support for static standing                    Cognition Arousal/Alertness: Awake/alert Behavior During Therapy: Anxious Overall Cognitive Status: History of cognitive impairments - at baseline       Memory: Decreased short-term memory              Exercises      General Comments General comments (skin integrity, edema, etc.): BP supine at start of session 122/90, seated  in bed 126/93, standing at bedside 98/66      Pertinent Vitals/Pain Pain Assessment: Faces Faces Pain Scale: Hurts even more Pain Location: R knee with mobility Pain Descriptors / Indicators: Grimacing;Guarding;Moaning Pain Intervention(s): Limited activity within patient's tolerance;Monitored during session;Repositioned    Home Living Family/patient expects to be discharged to:: Skilled nursing facility               Additional  Comments: Living in a Memory Care Unit    Prior Function Level of Independence: Needs assistance  Gait / Transfers Assistance Needed: Per niece the pt uses a WC and can self-propel using UEs and LEs.  Recently anytime she attempt to stand from [facility] she gets dizzy and falls.  Minimally ambulatory previously, family has attempted to have pt exclusively use WC to prevent falls due to her chronic orthostatic hypotension. ADL's / Homemaking Assistance Needed: Needs assist for all ADLs     PT Goals (current goals can now be found in the care plan section) Acute Rehab PT Goals Patient Stated Goal: per family, to return to memory care unit PT Goal Formulation: With family Time For Goal Achievement: 07/20/16 Potential to Achieve Goals: Fair    Frequency    Min 2X/week      PT Plan      Co-evaluation             End of Session Equipment Utilized During Treatment: Gait belt Activity Tolerance: Treatment limited secondary to medical complications (Comment) (orthostatic hypotension) Patient left: in bed;with call bell/phone within reach;with nursing/sitter in room;with family/visitor present;with SCD's reapplied     Time: 1530-1554 PT Time Calculation (min) (ACUTE ONLY): 24 min  Charges:  $Therapeutic Activity: 8-22 mins                    G Codes:  Functional Assessment Tool Used: Clinical Judgement Functional Limitation: Mobility: Walking and moving around Mobility: Walking and Moving Around Current Status [id number]): At least 40 percent but less than 60 percent impaired, limited or restricted Mobility: Walking and Moving Around Goal Status [phone]): At least 20 percent but less than 40 percent impaired, limited or restricted    Collie Siad PT, DPT 07/06/2016, 4:37 PM

## 2016-07-06 NOTE — Progress Notes (Signed)
On Admission: Hematoma/wound on left forehead with steristrips; Hematoma (goose-egg) on posterior top of head. Barbaraann Faster, RN

## 2016-07-06 NOTE — NC FL2 (Signed)
[place] LEVEL OF CARE SCREENING TOOL     IDENTIFICATION  Patient Name: [name] Birthdate: 20-May-1928 Sex: female Admission Date (Current Location): 07/05/2016  [facility] and [place] Number:  Engineering geologist and Address:  [facility], [address], [place], [place] [address]      Provider Number: [id number]  Attending Physician Name and Address:  Fritzi Mandes, MD  Relative Name and Phone Number:       Current Level of Care: Hospital Recommended Level of Care: Memory Care Prior Approval Number:    Date Approved/Denied:   PASRR Number:    Discharge Plan:  (alf memory care)    Current Diagnoses: Patient Active Problem List   Diagnosis Date Noted  . Fall 07/05/2016  . Visual hallucinations 03/28/2013  . Depression 01/12/2013  . Alzheimer's dementia 06/23/2012  . Syncope 12/06/2011  . Other constipation 03/22/2011  . ADENOCARCINOMA, COLON 07/15/2010  . ANXIETY STATE, UNSPECIFIED 07/15/2010  . POSTURAL HYPOTENSION 07/15/2010    Orientation RESPIRATION BLADDER Height & Weight     Self  Normal Incontinent Weight: 143 lb (64.9 kg) Height:  5\' 2"  (157.5 cm)  BEHAVIORAL SYMPTOMS/MOOD NEUROLOGICAL BOWEL NUTRITION STATUS  Wanderer  (none) Incontinent Diet (regular)  AMBULATORY STATUS COMMUNICATION OF NEEDS Skin   Extensive Assist Verbally Bruising (2 hematomas on head)                       Personal Care Assistance Level of Assistance  Bathing, Dressing, Feeding Bathing Assistance: Limited assistance Feeding assistance: Limited assistance Dressing Assistance: Limited assistance     Functional Limitations Info             SPECIAL CARE FACTORS FREQUENCY                       Contractures Contractures Info: Not present    Additional Factors Info  Code Status Code Status Info: DNR             Current Discharge Medication List        CONTINUE these medications which have CHANGED    Details  fludrocortisone (FLORINEF) 0.1 MG tablet Take 1 tablet (0.1 mg total) by mouth 2 (two) times daily. Qty: 30 tablet, Refills: 0    QUEtiapine (SEROQUEL) 25 MG tablet Take 1 tablet (25 mg total) by mouth at bedtime. Reported on 03/19/2016 Qty: 30 tablet, Refills: 0          CONTINUE these medications which have NOT CHANGED   Details  acidophilus (RISAQUAD) CAPS capsule Take 1 capsule by mouth daily.    aspirin 81 MG chewable tablet Chew 81 mg by mouth daily.    busPIRone (BUSPAR) 10 MG tablet Take 10 mg by mouth 2 (two) times daily.     Cholecalciferol (VITAMIN D3 SUPER STRENGTH) 2000 units CAPS Take 2,000 Units by mouth daily.    diclofenac sodium (VOLTAREN) 1 % GEL Apply 4 g topically 2 (two) times daily. Apply to right knee    divalproex (DEPAKOTE SPRINKLE) 125 MG capsule Take 250 mg by mouth 2 (two) times daily.     Docusate Sodium 150 MG/15ML syrup Take 100 mg by mouth 2 (two) times daily.    ferrous sulfate 325 (65 FE) MG tablet Take 325 mg by mouth daily with breakfast.    Melatonin 3 MG TABS Take 3 mg by mouth at bedtime.    !! memantine (NAMENDA) 5 MG tablet Take 5 mg  by mouth daily. Taper dose, decrease to 5mg  daily for 4 days.    Multiple Vitamin (MULTIVITAMIN WITH MINERALS) TABS tablet Take 1 tablet by mouth daily.    potassium chloride 20 MEQ/15ML (10%) solution Take 22.5 mLs (30 mEq total) by mouth daily. Qty: 500 mL, Refills: 0    !! memantine (NAMENDA) 5 MG tablet Take 5 mg by mouth every other day. Taper dose, decrease to 5mg  every other day for 4 days then STOP     !! - Potential duplicate medications found. Please discuss with provider.       STOP taking these medications     ciprofloxacin (CIPRO) 250 MG tablet      LORazepam (ATIVAN) 0.5 MG tablet      neomycin-bacitracin-polymyxin (NEOSPORIN) ophthalmic ointment      polyethylene glycol (MIRALAX / GLYCOLAX) packet      traZODone (DESYREL) 50 MG tablet           Relevant Imaging Results:  Relevant Lab Results:   Additional Information    Shela Leff, LCSW

## 2016-07-06 NOTE — Progress Notes (Signed)
PT Cancellation Note  Patient Details Name: [name] MRN: [id number] DOB: 06/09/1928   Cancelled Treatment:    Reason Eval/Treat Not Completed: Other (comment) (Pt scheduled to have x-ray of knee).  Spoke with Dr. Posey Pronto and decision has been made to initiate PT evaluation after x-ray results available.   Collie Siad PT, DPT 07/06/2016, 2:13 PM

## 2016-07-06 NOTE — Clinical Social Work Note (Signed)
Clinical Social Work Assessment  Patient Details  Name: [name] MRN: [id number] Date of Birth: 12/19/1927  Date of referral:  07/06/16               Reason for consult:  Facility Placement                Permission sought to share information with:    Permission granted to share information::   (patient with advanced dementia)  Name::        Agency::     Relationship::     Contact Information:     Housing/Transportation Living arrangements for the past 2 months:  [place] of Information:  Facility, Adult Children Patient Interpreter Needed:  None Criminal Activity/Legal Involvement Pertinent to Current Situation/Hospitalization:  No - Comment as needed Significant Relationships:  Adult Children Lives with:  Facility Resident Do you feel safe going back to the place where you live?  Yes Need for family participation in patient care:  No (Coment)  Care giving concerns:  Patient resides at [facility].   Social Worker assessment / plan:  CSW informed by physician that patient was to discharge today. CSW spoke with Joycelyn Schmid at [facility] and she stated that patient can return. She stated that they do not have transportation so EMS would need to transport. CSW spoke with patient's daughter this afternoon and initially she stated she would transport then she changed her mind and wanted her to go by EMS. Discharge information prepared and sent to [facility].   Employment status:  Retired Forensic scientist:  Medicare PT Recommendations:  Not assessed at this time Information / Referral to community resources:     Patient/Family's Response to care:  Patient's daughter expressed appreciation for CSW assistance.   Patient/Family's Understanding of and Emotional Response to Diagnosis, Current Treatment, and Prognosis:  Patient's daughter pleased that her mother will get to return to [facility] today.  Emotional  Assessment Appearance:  Appears stated age Attitude/Demeanor/Rapport:  Unable to Assess Affect (typically observed):  Unable to Assess Orientation:  Oriented to Self Alcohol / Substance use:  Not Applicable Psych involvement (Current and /or in the community):  No (Comment)  Discharge Needs  Concerns to be addressed:  Care Coordination Readmission within the last 30 days:  No Current discharge risk:  None Barriers to Discharge:  No Barriers Identified   Shela Leff, LCSW 07/06/2016, 4:24 PM

## 2016-07-06 NOTE — Progress Notes (Signed)
Left frontal hematoma bleeding, steristrips intact; ice pack applied to head. Barbaraann Faster, RN 6:37 AM 07/06/2016

## 2016-07-06 NOTE — Progress Notes (Signed)
Skin issues on admission: Left heel callous Left medial great toe red, blanches Right 2nd toe, lateral red, blanches Right pinky toe red, blanches Left flank bruise Left hip bruise Left knee bruise Right knee swollen Right flank bruise Right mid back dry/flakey/scab Left thigh bruise Right inner knee bruise MASD-between buttocks Barbaraann Faster, RN 2:22 AM 07/06/2016

## 2016-07-06 NOTE — Progress Notes (Signed)
*  PRELIMINARY RESULTS* Echocardiogram 2D Echocardiogram has been performed.  [name] 07/06/2016, 11:36 AM

## 2016-07-07 ENCOUNTER — Emergency Department
Admission: EM | Admit: 2016-07-07 | Discharge: 2016-07-08 | Disposition: A | Discharge status: Home / Self Care | Payer: Medicare Other | Attending: Emergency Medicine | Admitting: Emergency Medicine

## 2016-07-07 DIAGNOSIS — Z79899 Other long term (current) drug therapy: Secondary | ICD-10-CM | POA: Diagnosis not present

## 2016-07-07 DIAGNOSIS — Z9181 History of falling: Secondary | ICD-10-CM | POA: Insufficient documentation

## 2016-07-07 DIAGNOSIS — Y939 Activity, unspecified: Secondary | ICD-10-CM | POA: Insufficient documentation

## 2016-07-07 DIAGNOSIS — F0280 Dementia in other diseases classified elsewhere without behavioral disturbance: Secondary | ICD-10-CM

## 2016-07-07 DIAGNOSIS — I951 Orthostatic hypotension: Secondary | ICD-10-CM

## 2016-07-07 DIAGNOSIS — R55 Syncope and collapse: Secondary | ICD-10-CM | POA: Diagnosis present

## 2016-07-07 DIAGNOSIS — Y929 Unspecified place or not applicable: Secondary | ICD-10-CM | POA: Diagnosis not present

## 2016-07-07 DIAGNOSIS — R262 Difficulty in walking, not elsewhere classified: Secondary | ICD-10-CM

## 2016-07-07 DIAGNOSIS — W1839XA Other fall on same level, initial encounter: Secondary | ICD-10-CM | POA: Insufficient documentation

## 2016-07-07 DIAGNOSIS — G309 Alzheimer's disease, unspecified: Secondary | ICD-10-CM | POA: Diagnosis not present

## 2016-07-07 DIAGNOSIS — Z85038 Personal history of other malignant neoplasm of large intestine: Secondary | ICD-10-CM | POA: Insufficient documentation

## 2016-07-07 DIAGNOSIS — Y999 Unspecified external cause status: Secondary | ICD-10-CM | POA: Insufficient documentation

## 2016-07-07 LAB — BASIC METABOLIC PANEL
ANION GAP: 7 (ref 5–15)
BUN: 25 mg/dL — ABNORMAL HIGH (ref 6–20)
CHLORIDE: 102 mmol/L (ref 101–111)
CO2: 30 mmol/L (ref 22–32)
Calcium: 8.5 mg/dL — ABNORMAL LOW (ref 8.9–10.3)
Creatinine, Ser: 1.06 mg/dL — ABNORMAL HIGH (ref 0.44–1.00)
GFR calc non Af Amer: 46 mL/min — ABNORMAL LOW (ref >60)
GFR, EST AFRICAN AMERICAN: 54 mL/min — AB (ref >60)
GLUCOSE: 83 mg/dL (ref 65–99)
POTASSIUM: 4.4 mmol/L (ref 3.5–5.1)
Sodium: 139 mmol/L (ref 135–145)

## 2016-07-07 LAB — URINALYSIS COMPLETE WITH MICROSCOPIC (ARMC ONLY)
Bilirubin Urine: NEGATIVE
Glucose, UA: NEGATIVE mg/dL
Hgb urine dipstick: NEGATIVE
KETONES UR: NEGATIVE mg/dL
NITRITE: NEGATIVE
PH: 5 (ref 5.0–8.0)
PROTEIN: NEGATIVE mg/dL
SPECIFIC GRAVITY, URINE: 1.015 (ref 1.005–1.030)

## 2016-07-07 LAB — CBC WITH DIFFERENTIAL/PLATELET
Basophils Absolute: 0 10*3/uL (ref 0–0.1)
Basophils Relative: 1 %
Eosinophils Absolute: 0.2 10*3/uL (ref 0–0.7)
Eosinophils Relative: 3 %
HEMATOCRIT: 32.4 % — AB (ref 35.0–47.0)
HEMOGLOBIN: 10.6 g/dL — AB (ref 12.0–16.0)
LYMPHS PCT: 30 %
Lymphs Abs: 2.2 10*3/uL (ref 1.0–3.6)
MCH: 28.8 pg (ref 26.0–34.0)
MCHC: 32.6 g/dL (ref 32.0–36.0)
MCV: 88.4 fL (ref 80.0–100.0)
MONO ABS: 0.7 10*3/uL (ref 0.2–0.9)
MONOS PCT: 9 %
NEUTROS ABS: 4.4 10*3/uL (ref 1.4–6.5)
NEUTROS PCT: 57 %
Platelets: 185 10*3/uL (ref 150–440)
RBC: 3.67 MIL/uL — ABNORMAL LOW (ref 3.80–5.20)
RDW: 16.4 % — AB (ref 11.5–14.5)
WBC: 7.5 10*3/uL (ref 3.6–11.0)

## 2016-07-07 MED ORDER — ASPIRIN 81 MG PO CHEW
81.0000 mg | CHEWABLE_TABLET | Freq: Every day | ORAL | Status: DC
  Administered 2016-07-07 – 2016-07-08 (×2): 81 mg via ORAL
  Filled 2016-07-07 (×2): qty 1

## 2016-07-07 MED ORDER — ADULT MULTIVITAMIN W/MINERALS CH
1.0000 | ORAL_TABLET | Freq: Every day | ORAL | Status: DC
  Administered 2016-07-07 – 2016-07-08 (×2): 1 via ORAL
  Filled 2016-07-07 (×2): qty 1

## 2016-07-07 MED ORDER — MELATONIN 5 MG PO TABS
5.0000 mg | ORAL_TABLET | Freq: Every day | ORAL | Status: DC
  Administered 2016-07-07: 5 mg via ORAL
  Filled 2016-07-07 (×2): qty 1

## 2016-07-07 MED ORDER — DIVALPROEX SODIUM 125 MG PO CSDR
250.0000 mg | DELAYED_RELEASE_CAPSULE | Freq: Two times a day (BID) | ORAL | Status: DC
  Administered 2016-07-07 – 2016-07-08 (×2): 250 mg via ORAL
  Filled 2016-07-07 (×2): qty 2

## 2016-07-07 MED ORDER — QUETIAPINE FUMARATE 25 MG PO TABS
25.0000 mg | ORAL_TABLET | Freq: Every day | ORAL | Status: DC
  Administered 2016-07-07: 25 mg via ORAL
  Filled 2016-07-07: qty 1

## 2016-07-07 MED ORDER — DICLOFENAC SODIUM 1 % TD GEL
4.0000 g | Freq: Two times a day (BID) | TRANSDERMAL | Status: DC
  Administered 2016-07-07: 4 g via TOPICAL
  Filled 2016-07-07 (×2): qty 100

## 2016-07-07 MED ORDER — BUSPIRONE HCL 10 MG PO TABS
10.0000 mg | ORAL_TABLET | Freq: Two times a day (BID) | ORAL | Status: DC
  Administered 2016-07-07 – 2016-07-08 (×2): 10 mg via ORAL
  Filled 2016-07-07 (×2): qty 1

## 2016-07-07 MED ORDER — DOCUSATE SODIUM 100 MG PO CAPS
100.0000 mg | ORAL_CAPSULE | Freq: Two times a day (BID) | ORAL | Status: DC
  Administered 2016-07-07 – 2016-07-08 (×2): 100 mg via ORAL
  Filled 2016-07-07 (×2): qty 1

## 2016-07-07 MED ORDER — MEMANTINE HCL 5 MG PO TABS
5.0000 mg | ORAL_TABLET | Freq: Every day | ORAL | Status: DC
  Administered 2016-07-07 – 2016-07-08 (×2): 5 mg via ORAL
  Filled 2016-07-07 (×2): qty 1

## 2016-07-07 MED ORDER — POTASSIUM CHLORIDE 20 MEQ/15ML (10%) PO SOLN
20.0000 meq | Freq: Every day | ORAL | Status: DC
  Administered 2016-07-07 – 2016-07-08 (×2): 20 meq via ORAL
  Filled 2016-07-07 (×2): qty 15

## 2016-07-07 MED ORDER — MEMANTINE HCL 5 MG PO TABS
5.0000 mg | ORAL_TABLET | ORAL | Status: DC
  Administered 2016-07-08: 5 mg via ORAL
  Filled 2016-07-07: qty 1

## 2016-07-07 NOTE — NC FL2 (Signed)
[place] LEVEL OF CARE SCREENING TOOL     IDENTIFICATION  Patient Name: [name] Birthdate: May 05, 1928 Sex: female Admission Date (Current Location): 07/07/2016  [place] and [place] Number:  Engineering geologist and Address:  [facility], [address] [place], [place] [address]      Provider Number: [id number]  Attending Physician Name and Address:  Carrie Mew, MD  Relative Name and Phone Number:       Current Level of Care: Hospital Recommended Level of Care: [place] Prior Approval Number:    Date Approved/Denied:   PASRR Number: [id number]  Discharge Plan: SNF    Current Diagnoses: Patient Active Problem List   Diagnosis Date Noted  . Fall 07/05/2016  . Visual hallucinations 03/28/2013  . Depression 01/12/2013  . Alzheimer's dementia 06/23/2012  . Syncope 12/06/2011  . Other constipation 03/22/2011  . ADENOCARCINOMA, COLON 07/15/2010  . ANXIETY STATE, UNSPECIFIED 07/15/2010  . POSTURAL HYPOTENSION 07/15/2010    Orientation RESPIRATION BLADDER Height & Weight     Self  Normal Incontinent Weight: 143 lb (64.9 kg) Height:  5\' 2"  (157.5 cm)  BEHAVIORAL SYMPTOMS/MOOD NEUROLOGICAL BOWEL NUTRITION STATUS      Incontinent Diet (Regular)  AMBULATORY STATUS COMMUNICATION OF NEEDS Skin   Extensive Assist Verbally Bruising (2 hematomas on head)                       Personal Care Assistance Level of Assistance  Bathing, Dressing, Feeding Bathing Assistance: Limited assistance Feeding assistance: Limited assistance Dressing Assistance: Limited assistance     Functional Limitations Info  Sight, Hearing, Speech Sight Info: Adequate Hearing Info: Adequate Speech Info: Adequate    SPECIAL CARE FACTORS FREQUENCY  PT (By licensed PT), OT (By licensed OT)     PT Frequency: 5x OT Frequency: 5x            Contractures Contractures Info: Not present    Additional Factors Info   Code Status, Allergies Code Status Info: DNR Allergies Info: Aricept Donepezil Hcl, Codeine, Erythromycin, Penicillins, Sulfonamide Derivatives           Current Medications (07/07/2016):  This is the current hospital active medication list No current facility-administered medications for this encounter.    Current Outpatient Prescriptions  Medication Sig Dispense Refill  . acidophilus (RISAQUAD) CAPS capsule Take 1 capsule by mouth daily.    Marland Kitchen aspirin 81 MG chewable tablet Chew 81 mg by mouth daily.    . busPIRone (BUSPAR) 10 MG tablet Take 10 mg by mouth 2 (two) times daily.     . Cholecalciferol (VITAMIN D3 SUPER STRENGTH) 2000 units CAPS Take 2,000 Units by mouth daily.    . diclofenac sodium (VOLTAREN) 1 % GEL Apply 4 g topically 2 (two) times daily. Apply to right knee    . divalproex (DEPAKOTE SPRINKLE) 125 MG capsule Take 250 mg by mouth 2 (two) times daily.     Mariane Baumgarten Sodium 150 MG/15ML syrup Take 100 mg by mouth 2 (two) times daily.    . ferrous sulfate 325 (65 FE) MG tablet Take 325 mg by mouth daily with breakfast.    . fludrocortisone (FLORINEF) 0.1 MG tablet Take 1 tablet (0.1 mg total) by mouth 2 (two) times daily. 30 tablet 0  . Melatonin 3 MG TABS Take 3 mg by mouth at bedtime.    . memantine (NAMENDA) 5 MG tablet Take 5 mg by mouth daily. Taper dose, decrease to 5mg   daily for 4 days.    Derrill Memo ON 07/08/2016] memantine (NAMENDA) 5 MG tablet Take 5 mg by mouth every other day. Taper dose, decrease to 5mg  every other day for 4 days then STOP    . Multiple Vitamin (MULTIVITAMIN WITH MINERALS) TABS tablet Take 1 tablet by mouth daily.    . potassium chloride 20 MEQ/15ML (10%) solution Take 22.5 mLs (30 mEq total) by mouth daily. (Patient taking differently: Take 20 mEq by mouth daily. ) 500 mL 0  . QUEtiapine (SEROQUEL) 25 MG tablet Take 1 tablet (25 mg total) by mouth at bedtime. Reported on 03/19/2016 30 tablet 0     Discharge Medications: Please see discharge  summary for a list of discharge medications.  Relevant Imaging Results:  Relevant Lab Results:   Additional Information SSN: [id number] Please note: Patient is private pay.  Georga Kaufmann, LCSWA

## 2016-07-07 NOTE — ED Provider Notes (Signed)
[facility] Emergency Department Provider Note  ____________________________________________  Time seen: Approximately 6:13 PM  I have reviewed the triage vital signs and the nursing notes.   HISTORY  Chief Complaint Fall  Level 5 caveat:  Portions of the history and physical were unable to be obtained due to the patient's chronic dementia   HPI FATUMA CANIZALES is a 82 y.o. female brought to the ED by her family from [place] assisted living due to syncope and fall today. Patient has a history of recurrent syncope and postural hypotension. She was recently hospitalized just a few days ago due to falls and recurrent syncope with standing resulting in head injury. She improved with vigorous hydration during the hospitalization. Family acknowledges that the patient does not eat or drink very much at home. They report no new injuries today and that the patient was being lowered into a chair when she passed out, and was continued be lowered and without any abrupt fall.     Past Medical History:  Diagnosis Date  . Alzheimer's dementia   . Anxiety    H/o anxiety, would use as needed diazepam with relief  . Colon cancer [facility])    h/o colon CA 2010- colectomy 2010  (Dr. Lamonte Sakai with oncology)  . Orthostasis    h/o orthostasis, didn't tolerate midodrine, prev eval by Dr. Marlou Porch  . PONV (postoperative nausea and vomiting)   . Thyroid disease    H/o thyroid nodule with benign bx 2010     Patient Active Problem List   Diagnosis Date Noted  . Fall 07/05/2016  . Visual hallucinations 03/28/2013  . Depression 01/12/2013  . Alzheimer's dementia 06/23/2012  . Syncope 12/06/2011  . Other constipation 03/22/2011  . ADENOCARCINOMA, COLON 07/15/2010  . ANXIETY STATE, UNSPECIFIED 07/15/2010  . POSTURAL HYPOTENSION 07/15/2010     Past Surgical History:  Procedure Laterality Date  . ABDOMINAL HYSTERECTOMY    . PARTIAL COLECTOMY  2010   for colon CA  . US  ECHOCARDIOGRAPHY  [id number]   normal     Prior to Admission medications   Medication Sig Start Date End Date Taking? Authorizing Provider  acidophilus (RISAQUAD) CAPS capsule Take 1 capsule by mouth daily.   Yes Historical Provider, MD  aspirin 81 MG chewable tablet Chew 81 mg by mouth daily.   Yes Historical Provider, MD  busPIRone (BUSPAR) 10 MG tablet Take 10 mg by mouth 2 (two) times daily.    Yes Historical Provider, MD  Cholecalciferol (VITAMIN D3 SUPER STRENGTH) 2000 units CAPS Take 2,000 Units by mouth daily.   Yes Historical Provider, MD  diclofenac sodium (VOLTAREN) 1 % GEL Apply 4 g topically 2 (two) times daily. Apply to right knee   Yes Historical Provider, MD  divalproex (DEPAKOTE SPRINKLE) 125 MG capsule Take 250 mg by mouth 2 (two) times daily.    Yes Historical Provider, MD  Docusate Sodium 150 MG/15ML syrup Take 100 mg by mouth 2 (two) times daily.   Yes Historical Provider, MD  ferrous sulfate 325 (65 FE) MG tablet Take 325 mg by mouth daily with breakfast.   Yes Historical Provider, MD  Melatonin 3 MG TABS Take 3 mg by mouth at bedtime.   Yes Historical Provider, MD  Multiple Vitamin (MULTIVITAMIN WITH MINERALS) TABS tablet Take 1 tablet by mouth daily.   Yes Historical Provider, MD  fludrocortisone (FLORINEF) 0.1 MG tablet Take 1 tablet (0.1 mg total) by mouth 2 (two) times daily. 07/06/16   Fritzi Mandes, MD  memantine (NAMENDA) 5 MG tablet Take 5 mg by mouth daily. Taper dose, decrease to 5mg  daily for 4 days. 07/03/16 07/06/16  Historical Provider, MD  memantine (NAMENDA) 5 MG tablet Take 5 mg by mouth every other day. Taper dose, decrease to 5mg  every other day for 4 days then STOP 07/08/16 07/14/16  Historical Provider, MD  potassium chloride 20 MEQ/15ML (10%) solution Take 22.5 mLs (30 mEq total) by mouth daily. Patient taking differently: Take 20 mEq by mouth daily.  08/07/13   Costin Karlyne Greenspan, MD  QUEtiapine (SEROQUEL) 25 MG tablet Take 1 tablet (25 mg total) by mouth at  bedtime. Reported on 03/19/2016 07/06/16   Fritzi Mandes, MD     Allergies Aricept Reather Littler hcl]; Codeine; Erythromycin; Penicillins; and Sulfonamide derivatives   No family history on file.  Social History Social History  Substance Use Topics  . Smoking status: Never Smoker  . Smokeless tobacco: Never Used  . Alcohol use No     Comment: rare    Review of Systems Unable to reliably obtained due to chronic dementia which is severe ____________________________________________   PHYSICAL EXAM:  VITAL SIGNS: ED Triage Vitals  Enc Vitals Group     BP 07/07/16 1702 (!) 160/88     Pulse Rate 07/07/16 1702 69     Resp 07/07/16 1702 12     Temp 07/07/16 1702 98.1 F (36.7 C)     Temp Source 07/07/16 1702 Oral     SpO2 07/07/16 1702 99 %     Weight 07/07/16 1703 143 lb (64.9 kg)     Height 07/07/16 1703 5\' 2"  (1.575 m)     Head Circumference --      [place] --      Pain Score --      Pain Loc --      Pain Edu? --      Excl. in [place]? --     Vital signs reviewed, nursing assessments reviewed.   Constitutional: Awake alert. Not oriented. Not in distress. Eyes:   No scleral icterus. No conjunctival pallor. PERRL. EOMI.  No nystagmus. ENT   Head:   Normocephalic with old contusions and hematomas on the left forehead and right posterior parietal scalp.   Nose:   No congestion/rhinnorhea. No septal hematoma   Mouth/Throat:   MMM, no pharyngeal erythema. No peritonsillar mass.    Neck:   No stridor. No SubQ emphysema. No meningismus. Hematological/Lymphatic/Immunilogical:   No cervical lymphadenopathy. Cardiovascular:   RRR. Symmetric bilateral radial and DP pulses.  No murmurs.  Respiratory:   Normal respiratory effort without tachypnea nor retractions. Breath sounds are clear and equal bilaterally. No wheezes/rales/rhonchi. Gastrointestinal:   Soft and nontender. Non distended. There is no CVA tenderness.  No rebound, rigidity, or guarding. Genitourinary:    deferred Musculoskeletal:   Nontender with normal range of motion in all extremities. No joint effusions.  No lower extremity tenderness.  No edema. Neurologic:   Normal speech and language.  CN 2-10 normal. Motor grossly intact. No gross focal neurologic deficits are appreciated.  Skin:    Skin is warm, dry and intact. No rash noted.  No petechiae, purpura, or bullae. Poor skin turgor  ____________________________________________    LABS (pertinent positives/negatives) (all labs ordered are listed, but only abnormal results are displayed) Labs Reviewed  BASIC METABOLIC PANEL - Abnormal; Notable for the following:       Result Value   BUN 25 (*)    Creatinine, Ser 1.06 (*)  Calcium 8.5 (*)    GFR calc non Af Amer 46 (*)    GFR calc Af Amer 54 (*)    All other components within normal limits  CBC WITH DIFFERENTIAL/PLATELET - Abnormal; Notable for the following:    RBC 3.67 (*)    Hemoglobin 10.6 (*)    HCT 32.4 (*)    RDW 16.4 (*)    All other components within normal limits  URINALYSIS COMPLETEWITH MICROSCOPIC ([facility] ONLY) - Abnormal; Notable for the following:    Color, Urine YELLOW (*)    APPearance HAZY (*)    Leukocytes, UA TRACE (*)    Bacteria, UA RARE (*)    Squamous Epithelial / LPF 0-5 (*)    All other components within normal limits  URINE CULTURE   ____________________________________________   EKG  Interpreted by me Sinus rhythm rate of 72, normal axis intervals QRS ST segments and T waves. No acute ischemic changes.  ____________________________________________    RADIOLOGY    ____________________________________________   PROCEDURES Procedures  ____________________________________________   INITIAL IMPRESSION / ASSESSMENT AND PLAN / ED COURSE  Pertinent labs & imaging results that were available during my care of the patient were reviewed by me and considered in my medical decision making (see chart for details).  Patient not in  distress. Presents for recurrent fall. We'll give the patient food and drink as desired for continued hydration and comfort. Social work evaluated the patient for skilled nursing facility placement. Awaiting the results of labs and urinalysis for further management. No new trauma.    ----------------------------------------- 7:58 PM on 07/07/2016 -----------------------------------------  Workup negative. Discussed with social worker was completed and I felt to which I have signed. Awaiting PT eval and that offers from skilled nursing facilities tomorrow. We'll monitor in the ED tonight with a sitter. Continue home meds.  Patient is signed out to Dr. Corky Downs.   Clinical Course    ____________________________________________   FINAL CLINICAL IMPRESSION(S) / ED DIAGNOSES  Final diagnoses:  Postural hypotension  Syncope, unspecified syncope type  Alzheimer's dementia without behavioral disturbance, unspecified timing of dementia onset       Portions of this note were generated with dragon dictation software. Dictation errors may occur despite best attempts at proofreading.    Carrie Mew, MD 07/07/16 1958

## 2016-07-07 NOTE — Clinical Social Work Note (Signed)
Clinical Social Work Assessment  Patient Details  Name: [name] MRN: [id number] Date of Birth: [birth year]  Date of referral:  07/07/16               Reason for consult:  Facility Placement                Permission sought to share information with:  Family Supports, Chartered certified accountant granted to share information::  Yes, Verbal Permission Granted  Name::        Agency::     Relationship::     Contact Information:     Housing/Transportation Living arrangements for the past 2 months:  [place] of Information:  Adult Children, Other (Comment Required) Passenger transport manager ) Patient Interpreter Needed:  None Criminal Activity/Legal Involvement Pertinent to Current Situation/Hospitalization:  No - Comment as needed Significant Relationships:  Adult Children, Other Family Members Lives with:  Facility Resident Do you feel safe going back to the place where you live?  No Need for family participation in patient care:  Yes (Comment)  Care giving concerns:  Pt lives in assisted living and has been having frequent syncopal episodes resulting in injury.    Social Worker assessment / plan:  CSW received consult for possible facility placement. CSW engaged with pt's daughter and niece at pt's bedside. Pt was present at the time of the interaction but is not fully oriented. Pt's family expressed concerns about pt returning to ALF because they believe that she needs a higher level of care. Pt was discharged from the hospital yesterday afternoon and has since already had multiple episodes of passing out and injuring herself. Pt's family would like for pt to go to Skilled Nursing level of care. 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 CSW explained to the family that if pt does not meet the criteria then the SNF stay would have to be private pay. Pt's family states that this is an option  that they are comfortable with because the pt's care is what is most important to them. CSW explained the referral process and provided pt's family with a list of local SNFs in the area. Pt's have a preference for [facility] or [facility].   CSW completed FL2 and faxed out referral to facilities. CSW will present bed offers to the family when appropriate. Given the time of day, it is likely that pt will not be able to discharge to a SNF until tomorrow. EDP notified.  Employment status:  Retired Forensic scientist:  Medicare PT Recommendations:  Not assessed at this time [place] / Referral to community resources:  [place]  Patient/Family's Response to care: Pt will d/c to SNF when medically cleared.  Patient/Family's Understanding of and Emotional Response to Diagnosis, Current Treatment, and Prognosis: Pt and pt's family are appreciative of the care provided by CSW at this time.  Emotional Assessment Appearance:  Appears stated age Attitude/Demeanor/Rapport:  Unable to Assess Affect (typically observed):  Unable to Assess Orientation:  Oriented to Self Alcohol / Substance use:  Not Applicable Psych involvement (Current and /or in the community):  No (Comment)  Discharge Needs  Concerns to be addressed:  Care Coordination, Discharge Planning Concerns Readmission within the last 30 days:  Yes Current discharge risk:  Dependent with Mobility, Cognitively Impaired Barriers to Discharge:  Continued Medical Work up   [employer], [place] 07/07/2016, 6:03 PM

## 2016-07-07 NOTE — ED Notes (Signed)
Pt's family left and sitter at bedside with patient.

## 2016-07-07 NOTE — ED Triage Notes (Signed)
Pt bib EMS from [facility] w/ c/o syncopal episode.  Per EMS, pt was seen yesterday and within week for falls.  Per EMS, daughter wanted pt checked out and was unhappy pt was discharged yesterday.  Pt altered, but at baseline.  No new injuries visualized.  NAD.  Resp even and unlabored. NAD.

## 2016-07-08 DIAGNOSIS — G309 Alzheimer's disease, unspecified: Secondary | ICD-10-CM | POA: Diagnosis not present

## 2016-07-08 MED ORDER — QUETIAPINE FUMARATE 25 MG PO TABS: 25.0000 mg | ORAL_TABLET | Freq: Every day | ORAL | 0 refills | Status: DC

## 2016-07-08 MED ORDER — BUSPIRONE HCL 10 MG PO TABS: 10.0000 mg | ORAL_TABLET | Freq: Two times a day (BID) | ORAL | 0 refills | Status: AC

## 2016-07-08 MED ORDER — DIVALPROEX SODIUM 125 MG PO CSDR: 250.0000 mg | DELAYED_RELEASE_CAPSULE | Freq: Two times a day (BID) | ORAL | 0 refills | Status: DC

## 2016-07-08 MED ORDER — ASPIRIN 81 MG PO CHEW: 81.0000 mg | CHEWABLE_TABLET | Freq: Every day | ORAL | 0 refills | Status: DC

## 2016-07-08 MED ORDER — CHOLECALCIFEROL 50 MCG (2000 UT) PO CAPS: 2000.0000 [IU] | ORAL_CAPSULE | Freq: Every day | ORAL | 0 refills | Status: AC

## 2016-07-08 MED ORDER — FERROUS SULFATE 325 (65 FE) MG PO TABS: 325.0000 mg | ORAL_TABLET | Freq: Every day | ORAL | 0 refills | Status: DC

## 2016-07-08 NOTE — ED Notes (Signed)
Pt left via EMS at this time.

## 2016-07-08 NOTE — ED Notes (Signed)
Pt was changed and cleaned up.

## 2016-07-08 NOTE — Clinical Social Work Placement (Signed)
   CLINICAL SOCIAL WORK PLACEMENT  NOTE  Date:  07/08/2016  Patient Details  Name: [name] MRN: [id number] Date of [name]: [birth year]  Clinical Social Work is seeking post-discharge placement for this patient at the [place] level of care (*CSW will initial, date and re-position this form in  chart as items are completed):  Yes   Patient/family provided with [facility] Work Department's list of facilities offering this level of care within the geographic area requested by the patient (or if unable, by the patient's family).  Yes   Patient/family informed of their freedom to choose among providers that offer the needed level of care, that participate in Medicare, Medicaid or managed care program needed by the patient, have an available bed and are willing to accept the patient.  Yes   Patient/family informed of [facility]'s ownership interest in [facility] and [facility], as well as of the fact that they are under no obligation to receive care at these facilities.  PASRR submitted to EDS on       PASRR number received on       Existing PASRR number confirmed on 07/07/16     FL2 transmitted to all facilities in geographic area requested by pt/family on 07/07/16     FL2 transmitted to all facilities within larger geographic area on       Patient informed that his/her managed care company has contracts with or will negotiate with certain facilities, including the following:   ([facility], [facility], [facility] )     Yes   Patient/family informed of bed offers received.  Patient chooses bed at  [facility] )     Physician recommends and patient chooses bed at      Patient to be transferred to  [facility] ) on 07/08/16.  Patient to be transferred to facility by [facility]     Patient family notified on 07/08/16 of transfer.  Name of family member notified:  Lattie Haw (daughter) [phone]     PHYSICIAN Please prepare  prescriptions, Please prepare priority discharge summary, including medications     Additional Comment:    _______________________________________________ Georga Kaufmann, LCSWA 07/08/2016, 3:53 PM

## 2016-07-08 NOTE — Progress Notes (Signed)
Pt has received bed offers at [facility], [facility], and [facility]. Pt has been declined by [facility]. CSW presented bed offers to the family. Pt's family is still trying to decide which facility will be an appropriate fit for the pt. Discharge is pending family's decision. EDP notified. CSW will continue to follow pt and support them in the process of choosing a facility.   Georga Kaufmann, MSW, [place]

## 2016-07-08 NOTE — ED Notes (Signed)
Pt given dinner tray and drink 

## 2016-07-08 NOTE — Evaluation (Signed)
Physical Therapy Evaluation Patient Details Name: [name] MRN: [id number] DOB: [birth year] Today's Date: 07/08/2016   History of Present Illness  Pt is a 82 y/o F who presents from [facility] with c/o syncopal episode.  Pt was just recently d/c from the hospital on 11/7 at which time she was admitted for multiple falls due to chronic orthostatic hypotension. During this most recent admission CT cervical spine showed L frontal scalp hematoma, no skull fx, +acute or recent fx at tip of inferior articular process of facet of R C4 with conservative managment and no need for brace. X-ray of R knee with no acute osseous injury. Pt's PMH includes Alzheimer's dementia, anxiety, colon cancer, chronic orthostatic hypotension.     Clinical Impression  Pt admitted with above diagnosis. Pt currently with functional limitations due to the deficits listed below (see PT Problem List). [name] presents with orthostatic hypotension with transition from supine>sit. BP supine 115/55, seated EOB 98/83.  Given pt's current mobility status, recommending SNF at d/c in memory care unit if possible.  Pt will benefit from skilled PT to increase their independence and safety with mobility to allow discharge to the venue listed below.      Follow Up Recommendations SNF (memory care unit)    Equipment Recommendations  None recommended by PT    Recommendations for Other Services       Precautions / Restrictions Precautions Precautions: Fall;Other (comment) Precaution Comments: Chronic orthostatic hypotension Restrictions Weight Bearing Restrictions: No      Mobility  Bed Mobility Overal bed mobility: Needs Assistance Bed Mobility: Supine to Sit;Sit to Supine     Supine to sit: +2 for physical assistance;Mod assist;HOB elevated Sit to supine: Mod assist   General bed mobility comments: Assist to elevate trunk and to manage LEs to achieve sitting EOB.  Assist managing BLEs to return to supine  and assist needed to scoot to [facility].  Transfers                 General transfer comment: Did not attempt due to orthostasis with supine>sit  Ambulation/Gait                Stairs            Wheelchair Mobility    Modified Rankin (Stroke Patients Only)       Balance Overall balance assessment: Needs assistance;History of Falls Sitting-balance support: Feet supported;No upper extremity supported Sitting balance-Leahy Scale: Fair                                       Pertinent Vitals/Pain Pain Assessment: Faces Faces Pain Scale: Hurts even more Pain Location: Grimacing with R knee PROM Pain Descriptors / Indicators: Grimacing;Moaning Pain Intervention(s): Limited activity within patient's tolerance;Monitored during session;Repositioned    Home Living Family/patient expects to be discharged to:: Skilled nursing facility                 Additional Comments: Living in a [facility] Care Unit at [facility]    Prior Function Level of Independence: Needs assistance   Gait / Transfers Assistance Needed: Per niece the pt uses a WC and can self-propel using UEs and LEs.  Recently anytime she attempt to stand from [facility] she gets dizzy and falls.  Minimally ambulatory previously, family has attempted to have pt exclusively use WC to prevent falls due to her chronic orthostatic hypotension.  ADL's / Homemaking Assistance Needed: Needs assist for all ADLs  Comments: Information taken from very recent PT evaluation     Hand Dominance        Extremity/Trunk Assessment   Upper Extremity Assessment: Generalized weakness           Lower Extremity Assessment: Generalized weakness;LLE deficits/detail   LLE Deficits / Details: L knee swollen, x-ray of knee on 11/7 negative for acute osseous injury  Cervical / Trunk Assessment: Other exceptions  Communication   Communication: No difficulties  Cognition Arousal/Alertness: Awake/alert Behavior  During Therapy: Anxious Overall Cognitive Status: History of cognitive impairments - at baseline       Memory: Decreased short-term memory              General Comments General comments (skin integrity, edema, etc.): BP supine 115/55, seated EOB 98/83    Exercises General Exercises - Lower Extremity Ankle Circles/Pumps: Right;5 reps;Seated;PROM (pt grimacing and moaning due to pain)   Assessment/Plan    PT Assessment Patient needs continued PT services  PT Problem List Decreased strength;Decreased range of motion;Decreased activity tolerance;Decreased balance;Decreased mobility;Decreased cognition;Decreased knowledge of use of DME;Decreased safety awareness;Pain          PT Treatment Interventions DME instruction;Gait training;Therapeutic exercise;Balance training;Therapeutic activities;Functional mobility training;Cognitive remediation;Patient/family education;Modalities    PT Goals (Current goals can be found in the Care Plan section)  Acute Rehab PT Goals Patient Stated Goal: none stated PT Goal Formulation: Patient unable to participate in goal setting Time For Goal Achievement: 07/15/16 Potential to Achieve Goals: Fair    Frequency Min 2X/week   Barriers to discharge        Co-evaluation               End of Session Equipment Utilized During Treatment: Gait belt Activity Tolerance: Treatment limited secondary to medical complications (Comment) (orthostatic) Patient left: in bed;with nursing/sitter in room (RN and nurse tech cleaning up pt) Nurse Communication: Mobility status;Other (comment) (BP)    Functional Assessment Tool Used: Clinical Judgement Functional Limitation: Mobility: Walking and moving around Mobility: Walking and Moving Around Current Status [phone]): At least 40 percent but less than 60 percent impaired, limited or restricted Mobility: Walking and Moving Around Goal Status [phone]): At least 20 percent but less than 40 percent impaired,  limited or restricted    Time: 0845-0859 PT Time Calculation (min) (ACUTE ONLY): 14 min   Charges:   PT Evaluation $PT Eval Low Complexity: 1 Procedure     PT G Codes:   PT G-Codes **NOT FOR INPATIENT CLASS** Functional Assessment Tool Used: Clinical Judgement Functional Limitation: Mobility: Walking and moving around Mobility: Walking and Moving Around Current Status [id number]): At least 40 percent but less than 60 percent impaired, limited or restricted Mobility: Walking and Moving Around Goal Status [phone]): At least 20 percent but less than 40 percent impaired, limited or restricted   Collie Siad PT, DPT 07/08/2016, 9:17 AM

## 2016-07-08 NOTE — ED Notes (Signed)
Breakfast tray given to sitter

## 2016-07-08 NOTE — ED Provider Notes (Signed)
-----------------------------------------   6:15 AM on 07/08/2016 -----------------------------------------   Blood pressure (!) 113/52, pulse 65, temperature 98.1 F (36.7 C), temperature source Oral, resp. rate 17, height 5\' 2"  (1.575 m), weight 143 lb (64.9 kg), SpO2 98 %.  The patient had no acute events since last update.  Calm and cooperative at this time.  Disposition is pending clinical social work team recommendations.     Paulette Blanch, MD 07/08/16 [phone]

## 2016-07-08 NOTE — Progress Notes (Signed)
Pt has received bed offers at [facility] and [facility]. CSW presented bed offers to the family at pt's bedside. Pt's family had several questions about the rates of the facilities and the process of the transfer. CSW answered all questions and explained the SNF transfer process. Pt's family would like for pt's referral to also be sent to [facility]. CSW agreed and has sent the referral over the Hub. CSW will continue to follow pt and support family in the task of choosing a facility.  Georga Kaufmann, MSW, [place]

## 2016-07-08 NOTE — ED Notes (Signed)
Pt was cleaned up and bedding was changed due to being wet. Pt also ate half of her biscuit and drank half of her orange juice.

## 2016-07-08 NOTE — ED Notes (Signed)
Patient turned, changed, skin care given.  Tolerated well. Continue to monitor.

## 2016-07-08 NOTE — Progress Notes (Addendum)
Pt and pt's family have chosen bed at [facility]. Pt will be transported via EMS today. RN can call report to [phone]. Room number is 807. EDP, RN, and ED secretary have been notified.  Georga Kaufmann, MSW, [place]

## 2016-07-09 LAB — URINE CULTURE: Culture: NO GROWTH

## 2016-07-12 ENCOUNTER — Non-Acute Institutional Stay (SKILLED_NURSING_FACILITY): Payer: Medicare Other | Admitting: Internal Medicine

## 2016-07-12 ENCOUNTER — Encounter: Payer: Self-pay | Admitting: Internal Medicine

## 2016-07-12 DIAGNOSIS — S0181XS Laceration without foreign body of other part of head, sequela: Secondary | ICD-10-CM | POA: Diagnosis not present

## 2016-07-12 DIAGNOSIS — M170 Bilateral primary osteoarthritis of knee: Secondary | ICD-10-CM

## 2016-07-12 DIAGNOSIS — D509 Iron deficiency anemia, unspecified: Secondary | ICD-10-CM

## 2016-07-12 DIAGNOSIS — R2681 Unsteadiness on feet: Secondary | ICD-10-CM

## 2016-07-12 DIAGNOSIS — I951 Orthostatic hypotension: Secondary | ICD-10-CM | POA: Diagnosis not present

## 2016-07-12 DIAGNOSIS — F0391 Unspecified dementia with behavioral disturbance: Secondary | ICD-10-CM

## 2016-07-12 DIAGNOSIS — F329 Major depressive disorder, single episode, unspecified: Secondary | ICD-10-CM

## 2016-07-12 DIAGNOSIS — G4700 Insomnia, unspecified: Secondary | ICD-10-CM | POA: Diagnosis not present

## 2016-07-12 DIAGNOSIS — K5909 Other constipation: Secondary | ICD-10-CM | POA: Diagnosis not present

## 2016-07-12 NOTE — Progress Notes (Signed)
LOCATION: [name]  PCP: No PCP Per Patient   Code Status: DNR  Goals of care: Advanced Directive information Advanced Directives 07/12/2016  Does patient have an advance directive? Yes  Type of Advance Directive Out of facility DNR (pink MOST or yellow form)  Does patient want to make changes to advanced directive? No - Patient declined  Copy of advanced directive(s) in chart? Yes  Pre-existing out of facility DNR order (yellow form or pink MOST form) -       Extended Emergency Contact Information Primary Emergency Contact: Rhyne,[name] Address: [place], [place] Phone: [phone] Work Phone: [phone] Mobile Phone: [phone] Relation: Daughter Secondary Emergency Contact: Janae Bridg[place] Phone: [phone] Mobile Phone: [phone] Relation: Niece   Allergies  Allergen Reactions  . Aricept [Donepezil Hcl]     Increased hallucinations   . Codeine   . Erythromycin     REACTION: GI upset  . Penicillins Other (See Comments)    Has patient had a PCN reaction causing immediate rash, facial/tongue/throat swelling, SOB or lightheadedness with hypotension: UNK  Has patient had a PCN reaction causing severe rash involving mucus membranes or skin necrosis: UNK Has patient had a PCN reaction that required hospitalization: UNK Has patient had a PCN reaction occurring within the last 10 years: UNK If all of the above answers are "NO", then may proceed with Cephalosporin use. ** ALLERGY LISTED ON MAR, UNKNOWN REACTION**  . Sulfonamide Derivatives     REACTION: GI upset    Chief Complaint  Patient presents with  . New Admit To SNF    New Admission Visit     HPI:  Patient is a 82 y.o. female seen today for short term rehabilitation post hospital admission from 07/05/16-07/06/16 post fall with left frontal hematoma and laceration to left periocular area from orthostatic  hypotension. She has been having recurrent falls. She received iv fluids and her florinef dosing was adjusted. Ct c-spine showed cervical spine fracture at c4. Neurosurgery was consulted and recommended conservative management. She has history of dementia and depression. She was discharged home and had another ED visit on 07/07/16 post fall. She is seen in her room today.   Review of Systems: Unable to obtain    Past Medical History:  Diagnosis Date  . Alzheimer's dementia   . Anxiety    H/o anxiety, would use as needed diazepam with relief  . Colon cancer [place])    h/o colon CA 2010- colectomy 2010  (Dr. Lamonte Sakai with oncology)  . Orthostasis    h/o orthostasis, didn't tolerate midodrine, prev eval by Dr. Marlou Porch  . PONV (postoperative nausea and vomiting)   . Thyroid disease    H/o thyroid nodule with benign bx 2010   Past Surgical History:  Procedure Laterality Date  . ABDOMINAL HYSTERECTOMY    . PARTIAL COLECTOMY  2010   for colon CA  . US ECHOCARDIOGRAPHY  [id number]   normal   Social History:   reports that she has never smoked. She has never used smokeless tobacco. She reports that she does not drink alcohol or use drugs.  No family history on file.  Medications:   Medication List       Accurate as of 07/12/16  1:07 PM. Always use your most recent med list.          Acidophilus Lactobacillus Caps Take 1 capsule by  mouth daily.   aspirin 81 MG chewable tablet Chew 1 tablet (81 mg total) by mouth daily.   busPIRone 10 MG tablet Commonly known as:  BUSPAR Take 1 tablet (10 mg total) by mouth 2 (two) times daily.   Cholecalciferol 2000 units Caps Commonly known as:  VITAMIN D3 SUPER STRENGTH Take 1 capsule (2,000 Units total) by mouth daily.   diclofenac sodium 1 % Gel Commonly known as:  VOLTAREN Apply 4 g topically 2 (two) times daily. Apply to right knee   divalproex 125 MG capsule Commonly known as:  DEPAKOTE SPRINKLE Take 2 capsules (250 mg total) by mouth 2  (two) times daily.   Docusate Sodium 150 MG/15ML syrup Take 100 mg by mouth 2 (two) times daily.   ferrous sulfate 325 (65 FE) MG tablet Take 1 tablet (325 mg total) by mouth daily with breakfast.   FLUDROCORTISONE ACETATE PO Take 0.1 mg by mouth 2 (two) times daily.   Melatonin 3 MG Tabs Take 3 mg by mouth at bedtime.   multivitamin with minerals Tabs tablet Take 1 tablet by mouth daily.   neomycin-polymyxin-pramoxine 1 % cream Commonly known as:  NEOSPORIN PLUS Apply 1 application topically daily.   potassium chloride 20 MEQ/15ML (10%) solution Take 22.5 mLs (30 mEq total) by mouth daily.   QUEtiapine 25 MG tablet Commonly known as:  SEROQUEL Take 1 tablet (25 mg total) by mouth at bedtime. Reported on 03/19/2016       Immunizations:  There is no immunization history on file for this patient.   Physical Exam:  Vitals:   07/12/16 1256  BP: (!) 148/67  Pulse: 64  Resp: 18  Temp: 97.7 F (36.5 C)  TempSrc: Oral  SpO2: 94%  Weight: 143 lb (64.9 kg)  Height: 5\' 2"  (1.575 m)   Body mass index is 26.16 kg/m.  General- elderly female, well built, in no acute distress Head- normocephalic, atraumatic Nose- no maxillary or frontal sinus tenderness Throat- moist mucus membrane Eyes- PERRLA, EOMI, no pallor, no icterus, no discharge Neck- no cervical lymphadenopathy Cardiovascular- normal s1,s2, no murmur Respiratory- bilateral clear to auscultation, no wheeze, no rhonchi, no crackles, no use of accessory muscles Abdomen- bowel sounds present, soft, non tender Musculoskeletal- able to move all 4 extremities, trace leg edema Neurological- alert and oriented to person, place and time, resting tremors Skin- warm and dry, left forehead laceration with sutures, resolving bruise around left eye Psychiatry- appears anxious    Labs reviewed: Basic Metabolic Panel:  Recent Labs  07/05/16 1616 07/06/16 0519 07/07/16 1733  NA 137 141 139  K 4.5 3.9 4.4  CL 102  105 102  CO2 30 30 30   GLUCOSE 120* 86 83  BUN 25* 20 25*  CREATININE 1.25* 0.80 1.06*  CALCIUM 9.0 8.3* 8.5*   Liver Function Tests:  Recent Labs  07/05/16 1616  AST 18  ALT 10*  ALKPHOS 77  BILITOT 0.5  PROT 6.8  ALBUMIN 3.5   No results for input(s): LIPASE, AMYLASE in the last 8760 hours. No results for input(s): AMMONIA in the last 8760 hours. CBC:  Recent Labs  07/05/16 1616 07/06/16 0519 07/07/16 1733  WBC 8.8 5.8 7.5  NEUTROABS 6.7*  --  4.4  HGB 10.8* 9.6* 10.6*  HCT 33.4* 29.0* 32.4*  MCV 87.3 87.6 88.4  PLT 190 149* 185   Cardiac Enzymes:  Recent Labs  07/05/16 1616  TROPONINI <0.03   BNP: Invalid input(s): POCBNP CBG: No results for input(s): GLUCAP in  the last 8760 hours.  Radiological Exams: Ct Head Wo Contrast  Result Date: 07/05/2016 CLINICAL DATA:  Unwitnessed falls today at skilled nursing facility. EXAM: CT HEAD WITHOUT CONTRAST CT CERVICAL SPINE WITHOUT CONTRAST TECHNIQUE: Multidetector CT imaging of the head and cervical spine was performed following the standard protocol without intravenous contrast. Multiplanar CT image reconstructions of the cervical spine were also generated. COMPARISON:  03/19/2016 FINDINGS: CT HEAD FINDINGS Brain: The brain shows generalized atrophy. There are mild chronic small-vessel ischemic changes of the deep white matter. No cortical or large vessel territory infarction. No mass lesion, hemorrhage, hydrocephalus or extra-axial collection. Vascular: There is atherosclerotic calcification of the major vessels at the base of the brain. Skull: No skull fracture. Sinuses/Orbits: Clear/normal Other: Left frontal scalp hematoma. CT CERVICAL SPINE FINDINGS Alignment: Normal Skull base and vertebrae: Skullbase is normal. There is a linear fracture at the tip of the inferior articular process of the facet on the right at C4. No other fracture is identified. Soft tissues and spinal canal: No soft tissue injury. Chronic  atherosclerotic change of the carotid artery's, mild. Multiple cystic thyroid masses bilaterally with intrathoracic extension consistent with goiter. Disc levels: Degenerative spondylosis at C3-4, C4-5, C5-6 and C6-7 with mild osteophytic encroachment upon the foramina. Degenerative facet arthropathy most pronounced on the right at C3-4, C4-5, C5-6 and C7-T1 and on the left at C4-5. Upper chest: Clear Other: None significant IMPRESSION: Head CT: Left frontal scalp hematoma. No skull fracture. No intracranial injury. Cervical spine CT: Acute or recent fracture at the tip of the inferior articular process of the facet on the right at C4. This is a stable fracture. No other fracture is identified. Chronic thyroid goiter. Electronically Signed   By: Nelson Chimes M.D.   On: 07/05/2016 16:50   Ct Cervical Spine Wo Contrast  Result Date: 07/05/2016 CLINICAL DATA:  Unwitnessed falls today at skilled nursing facility. EXAM: CT HEAD WITHOUT CONTRAST CT CERVICAL SPINE WITHOUT CONTRAST TECHNIQUE: Multidetector CT imaging of the head and cervical spine was performed following the standard protocol without intravenous contrast. Multiplanar CT image reconstructions of the cervical spine were also generated. COMPARISON:  03/19/2016 FINDINGS: CT HEAD FINDINGS Brain: The brain shows generalized atrophy. There are mild chronic small-vessel ischemic changes of the deep white matter. No cortical or large vessel territory infarction. No mass lesion, hemorrhage, hydrocephalus or extra-axial collection. Vascular: There is atherosclerotic calcification of the major vessels at the base of the brain. Skull: No skull fracture. Sinuses/Orbits: Clear/normal Other: Left frontal scalp hematoma. CT CERVICAL SPINE FINDINGS Alignment: Normal Skull base and vertebrae: Skullbase is normal. There is a linear fracture at the tip of the inferior articular process of the facet on the right at C4. No other fracture is identified. Soft tissues and spinal  canal: No soft tissue injury. Chronic atherosclerotic change of the carotid artery's, mild. Multiple cystic thyroid masses bilaterally with intrathoracic extension consistent with goiter. Disc levels: Degenerative spondylosis at C3-4, C4-5, C5-6 and C6-7 with mild osteophytic encroachment upon the foramina. Degenerative facet arthropathy most pronounced on the right at C3-4, C4-5, C5-6 and C7-T1 and on the left at C4-5. Upper chest: Clear Other: None significant IMPRESSION: Head CT: Left frontal scalp hematoma. No skull fracture. No intracranial injury. Cervical spine CT: Acute or recent fracture at the tip of the inferior articular process of the facet on the right at C4. This is a stable fracture. No other fracture is identified. Chronic thyroid goiter. Electronically Signed   By: Nelson Chimes  M.D.   On: 07/05/2016 16:50   Dg Knee Complete 4 Views Right  Result Date: 07/06/2016 CLINICAL DATA:  Recurrent fall, no prior injury, right knee EXAM: RIGHT KNEE - COMPLETE 4+ VIEW COMPARISON:  None. FINDINGS: No acute fracture or dislocation generalized osteopenia osteoarthritis of the lateral femorotibial compartment with joint space narrowing marginal osteophytosis. Medial femorotibial compartment joint space is maintained. No significant joint effusion. Multiple posterior loose bodies. IMPRESSION: No acute osseous injury of the right knee. Electronically Signed   By: Kathreen Devoid   On: 07/06/2016 15:15    Assessment/Plan  Unsteady gait Will have her work with physical therapy and occupational therapy team to help with gait training and muscle strengthening exercises.fall precautions. Skin care. Encourage to be out of bed.   Orthostatic hypotension Monitor BP reading. Slow position change encouraged. High fall risk. Continue florinef 0.1 mg bid  Left forehead laceration Has sutures in place. Will need skin care and suture removal. Fall precautions  Iron def Anemia  Monitor cbc. Continue  feso4  Chronic depression Will get psychiatry consult. Continue buspirone 10 mg bid and depakote sprinkles.   Chronic constipation Continue docusate bid  Dementia with behavioral disturbance Provide supportive care. Continue seroquel 25 mg qhs  Insomnia Continue melatonin  Knee OA Continue diclofenac gel    Goals of care: short term rehabilitation   Labs/tests ordered: cbc, cmp 07/13/16  Family/ staff Communication: reviewed care plan with patient and nursing supervisor    Blanchie Serve, MD Internal Medicine [place], [place] [address] Cell Phone (Monday-Friday 8 am - 5 pm): [phone] On Call: [phone] and follow prompts after 5 pm and on weekends Office Phone: [phone] Office Fax: [phone]

## 2016-07-20 ENCOUNTER — Encounter: Payer: Self-pay | Admitting: Family

## 2016-07-20 ENCOUNTER — Non-Acute Institutional Stay (SKILLED_NURSING_FACILITY): Payer: Medicare Other | Admitting: Family

## 2016-07-20 DIAGNOSIS — D509 Iron deficiency anemia, unspecified: Secondary | ICD-10-CM

## 2016-07-20 DIAGNOSIS — G308 Other Alzheimer's disease: Secondary | ICD-10-CM | POA: Diagnosis not present

## 2016-07-20 DIAGNOSIS — F411 Generalized anxiety disorder: Secondary | ICD-10-CM | POA: Diagnosis not present

## 2016-07-20 DIAGNOSIS — G309 Alzheimer's disease, unspecified: Secondary | ICD-10-CM

## 2016-07-20 DIAGNOSIS — F0281 Dementia in other diseases classified elsewhere with behavioral disturbance: Secondary | ICD-10-CM | POA: Diagnosis not present

## 2016-07-20 DIAGNOSIS — R2681 Unsteadiness on feet: Secondary | ICD-10-CM | POA: Diagnosis not present

## 2016-07-20 DIAGNOSIS — K5901 Slow transit constipation: Secondary | ICD-10-CM

## 2016-07-20 DIAGNOSIS — G4700 Insomnia, unspecified: Secondary | ICD-10-CM | POA: Diagnosis not present

## 2016-07-20 NOTE — Progress Notes (Signed)
Location:  [facility] Room Number: 103 Place of Service:  SNF (31)  Provider: Marlowe Sax, FNP-C   PCP: No PCP Per Patient Patient Care Team: No Pcp Per Patient as PCP - General (General Practice)  Extended Emergency Contact Information Primary Emergency Contact: Rhyne,[name] Address: [address]          [place], [place] Phone: [phone] Work Phone: [phone] Mobile Phone: [phone] Relation: Daughter Secondary Emergency Contact: [place] Phone: [phone] Mobile Phone: [phone] Relation: Niece  Code Status: DNR Goals of care:  Advanced Directive information Advanced Directives 07/20/2016  Does patient have an advance directive? Yes  Type of Advance Directive Out of facility DNR (pink MOST or yellow form)  Does patient want to make changes to advanced directive? No - Patient declined  Copy of advanced directive(s) in chart? Yes  Pre-existing out of facility DNR order (yellow form or pink MOST form) -     Allergies  Allergen Reactions  . Aricept [Donepezil Hcl]     Increased hallucinations   . Codeine   . Erythromycin     REACTION: GI upset  . Penicillins Other (See Comments)    Has patient had a PCN reaction causing immediate rash, facial/tongue/throat swelling, SOB or lightheadedness with hypotension: UNK  Has patient had a PCN reaction causing severe rash involving mucus membranes or skin necrosis: UNK Has patient had a PCN reaction that required hospitalization: UNK Has patient had a PCN reaction occurring within the last 10 years: UNK If all of the above answers are "NO", then may proceed with Cephalosporin use. ** ALLERGY LISTED ON MAR, UNKNOWN REACTION**  . Sulfonamide Derivatives     REACTION: GI upset    Chief Complaint  Patient presents with  . Discharge Note    Discharged from SNF    HPI:  82 y.o. female seen today at [facility] for discharge back to [facility]. She was here for short term Rehabilitation ED evaluation on 07/07/2016-07/08/2016 due to syncope and fall episode at [place].No injuries sustained. Of note she was discharged 07/06/2016 after hospital admission for multiple falls due to chronic orthostatic hypotension. She has a medical history of Alzhemier's dementia, Anxiety, Colon Cancer among other conditions. She is seen in her room today with sitter at bedside. She is pleasantly confused unable to provider HPI and ROS due to dementia. Facility Nurse reports no new concerns. She has worked well with PT/OT now stable for discharge back to [facility]. She will be discharge with HH: PT/OT for ROM, Exercise, Gait stability and muscle strengthening.She will require DME a reclining high back wheelchair 18 W X 16 D with extended leg rests, removable desk arm rest and a posture works medium profile engage orthotic cushion.Home health services will be arranged by facility social worker prior to discharge. Prescription medication will be written x 1 month then patient to follow up with PCP in 1-2 weeks.       Past Medical History:  Diagnosis Date  . Alzheimer's dementia   . Anxiety    H/o anxiety, would use as needed diazepam with relief  . Colon cancer [facility])    h/o colon CA 2010- colectomy 2010  (Dr. Lamonte Sakai with oncology)  . Orthostasis    h/o orthostasis, didn't tolerate midodrine, prev eval by Dr. Marlou Porch  . PONV (postoperative nausea and vomiting)   .  Thyroid disease    H/o thyroid nodule with benign bx 2010    Past Surgical History:  Procedure Laterality Date  . ABDOMINAL HYSTERECTOMY    . PARTIAL COLECTOMY  2010   for colon CA  . US ECHOCARDIOGRAPHY  [id number]   normal      reports that she has never smoked. She has never used smokeless tobacco. She reports that she does not drink alcohol or use drugs. Social History   Social History  . Marital status:  Widowed    Spouse name: N/A  . Number of children: 1  . Years of education: N/A   Occupational History  . retired Retired   Social History Main Topics  . Smoking status: Never Smoker  . Smokeless tobacco: Never Used  . Alcohol use No     Comment: rare  . Drug use: No  . Sexual activity: Not on file   Other Topics Concern  . Not on file   Social History Narrative   Widow x2, lives alone.     1 adopted daughter, leaves near patient.    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 Aricept [Donepezil Hcl]     Increased hallucinations   . Codeine   . Erythromycin     REACTION: GI upset  . Penicillins Other (See Comments)    Has patient had a PCN reaction causing immediate rash, facial/tongue/throat swelling, SOB or lightheadedness with hypotension: UNK  Has patient had a PCN reaction causing severe rash involving mucus membranes or skin necrosis: UNK Has patient had a PCN reaction that required hospitalization: UNK Has patient had a PCN reaction occurring within the last 10 years: UNK If all of the above answers are "NO", then may proceed with Cephalosporin use. ** ALLERGY LISTED ON MAR, UNKNOWN REACTION**  . Sulfonamide Derivatives     REACTION: GI upset    Pertinent  Health Maintenance Due  Topic Date Due  . DEXA SCAN  11/29/1994  . PNA vac Low Risk Adult (1 of 2 - PCV13) 11/29/1994  . INFLUENZA VACCINE  03/30/2016    Medications:   Medication List       Accurate as of 07/20/16  9:35 AM. Always use your most recent med list.          Acidophilus Lactobacillus Caps Take 1 capsule by mouth daily.   aspirin EC 81 MG tablet Take 81 mg by mouth daily.   busPIRone 10 MG tablet Commonly known as:  BUSPAR Take 10 mg by mouth 2 (two) times daily.   cholecalciferol 1000 units tablet Commonly known as:  VITAMIN D Take 2,000 Units by mouth  daily. 2 tablets   diclofenac sodium 1 % Gel Commonly known as:  VOLTAREN Apply 4 g topically 2 (two) times daily. Apply to right knee   divalproex 125 MG capsule Commonly known as:  DEPAKOTE SPRINKLE Take 250 mg by mouth 2 (two) times daily.   Docusate Sodium 50 MG/15ML Liqd Take 10 mLs by mouth 2 (two) times daily.   ferrous sulfate 325 (65 FE) MG tablet Take 325 mg by mouth daily with breakfast.   fludrocortisone 0.1 MG tablet Commonly known as:  FLORINEF Take 0.1 mg by mouth 2 (two) times daily.   Melatonin 3 MG Tabs Take 3 mg by mouth at bedtime.   multivitamin  with minerals Tabs tablet Take 1 tablet by mouth daily.   neomycin-polymyxin-pramoxine 1 % cream Commonly known as:  NEOSPORIN PLUS Apply 1 application topically daily.   potassium chloride 20 MEQ/15ML (10%) Soln Take 30 mEq by mouth daily. 15 ml = 30 meq   QUEtiapine 25 MG tablet Commonly known as:  SEROQUEL Take 25 mg by mouth at bedtime.       Review of Systems  Unable to perform ROS: Dementia    Vitals:   07/20/16 0913  BP: 130/72  Pulse: 70  Resp: 20  Temp: 98.5 F (36.9 C)  Weight: 139 lb 14.4 oz (63.5 kg)  Height: 5\' 2"  (1.575 m)   Body mass index is 25.59 kg/m. Physical Exam  Constitutional: She appears well-nourished.  Thin elderly pleasantly confused in no acute distress.   HENT:  Mouth/Throat: Oropharynx is clear and moist. No oropharyngeal exudate.  Eyes: Conjunctivae and EOM are normal. Pupils are equal, round, and reactive to light. Right eye exhibits no discharge. Left eye exhibits no discharge. No scleral icterus.  Neck: Normal range of motion. No tracheal deviation present. No thyromegaly present.  Cardiovascular: Normal rate, regular rhythm, normal heart sounds and intact distal pulses.  Exam reveals no gallop and no friction rub.   No murmur heard. Pulmonary/Chest: Effort normal and breath sounds normal. No respiratory distress. She has no wheezes. She has no rales.    Abdominal: Soft. Bowel sounds are normal. She exhibits no distension. There is no tenderness. There is no rebound and no guarding.  Genitourinary:  Genitourinary Comments: Incontinent   Musculoskeletal: She exhibits no edema, tenderness or deformity.  Moves X 4 extremities. Unsteady gait   Lymphadenopathy:    She has no cervical adenopathy.  Neurological: She is alert.  Pleasantly confused at baseline.   Skin: Skin is warm and dry. No rash noted. No erythema. No pallor.  Left forehead X 2 sutures dry, clean and intact. Surrounding skin without any signs of infections. Left eyebrow and left face old bruises progressive healing.  Psychiatric: She has a normal mood and affect.    Labs reviewed: Basic Metabolic Panel:  Recent Labs  07/05/16 1616 07/06/16 0519 07/07/16 1733  NA 137 141 139  K 4.5 3.9 4.4  CL 102 105 102  CO2 30 30 30   GLUCOSE 120* 86 83  BUN 25* 20 25*  CREATININE 1.25* 0.80 1.06*  CALCIUM 9.0 8.3* 8.5*   Liver Function Tests:  Recent Labs  07/05/16 1616  AST 18  ALT 10*  ALKPHOS 77  BILITOT 0.5  PROT 6.8  ALBUMIN 3.5   CBC:  Recent Labs  07/05/16 1616 07/06/16 0519 07/07/16 1733  WBC 8.8 5.8 7.5  NEUTROABS 6.7*  --  4.4  HGB 10.8* 9.6* 10.6*  HCT 33.4* 29.0* 32.4*  MCV 87.3 87.6 88.4  PLT 190 149* 185   Cardiac Enzymes:  Recent Labs  07/05/16 1616  TROPONINI <0.03     Assessment/Plan:   Unsteady Gait  Has work with PT/OT will be discharge with HH: PT/OT for ROM, Exercise, Gait stability and muscle strengthening.She will require DME a reclining high back wheelchair 18 W X 16 D with extended leg rests, removable desk arm rest and a posture works medium profile engage orthotic cushion. Fall and safety precautions.   Anemia  Continue on Ferrous sulfate 325 mg Tablet twice daily. CBC in 1-2 weeks with PCP  GAD Continue on Buspirone  10 mg Tablet twice daily.   Constipation  Current regimen  effective. Continue to encourage oral  intake and hydration.   Insomnia  Continue on Melatonin   Alzheimer's Dementia  No new behavioral issues reported. Continue on Depakote and Seroquel. Continue to assist with ADL's. Continue MVI.    Patient is being discharged with the following home health services:    - PT/OT for ROM, Exercise, Gait stability and muscle strengthening.  Patient is being discharged with the following durable medical equipment:    -A reclining high back wheelchair 18 W X 16 D with extended leg rests, removable desk arm rest and a posture works medium profile engage orthotic cushion.   Patient has been advised to f/u with their PCP in 1-2 weeks to for a transitions of care visit.  Social services at their facility was responsible for arranging this appointment.  Pt was provided with adequate prescriptions of noncontrolled medications to reach the scheduled appointment .  For controlled substances, a limited supply was provided as appropriate for the individual patient.  If the pt normally receives these medications from a pain clinic or has a contract with another physician, these medications should be received from that clinic or physician only).    Future labs/tests needed:  CBC  in 1-2 weeks with PCP

## 2016-11-25 ENCOUNTER — Emergency Department: Payer: Medicare Other

## 2016-11-25 ENCOUNTER — Inpatient Hospital Stay
Admission: EM | Admit: 2016-11-25 | Discharge: 2016-11-30 | DRG: 605 | Disposition: A | Discharge status: Skilled Nursing Facility | Payer: Medicare Other | Attending: Internal Medicine | Admitting: Internal Medicine

## 2016-11-25 DIAGNOSIS — Z66 Do not resuscitate: Secondary | ICD-10-CM | POA: Diagnosis present

## 2016-11-25 DIAGNOSIS — Z9181 History of falling: Secondary | ICD-10-CM

## 2016-11-25 DIAGNOSIS — X58XXXA Exposure to other specified factors, initial encounter: Secondary | ICD-10-CM | POA: Diagnosis present

## 2016-11-25 DIAGNOSIS — I951 Orthostatic hypotension: Secondary | ICD-10-CM | POA: Diagnosis present

## 2016-11-25 DIAGNOSIS — F02818 Dementia in other diseases classified elsewhere, unspecified severity, with other behavioral disturbance: Secondary | ICD-10-CM | POA: Diagnosis present

## 2016-11-25 DIAGNOSIS — Y92099 Unspecified place in other non-institutional residence as the place of occurrence of the external cause: Secondary | ICD-10-CM

## 2016-11-25 DIAGNOSIS — E039 Hypothyroidism, unspecified: Secondary | ICD-10-CM | POA: Diagnosis present

## 2016-11-25 DIAGNOSIS — Z881 Allergy status to other antibiotic agents status: Secondary | ICD-10-CM

## 2016-11-25 DIAGNOSIS — W19XXXA Unspecified fall, initial encounter: Secondary | ICD-10-CM | POA: Diagnosis present

## 2016-11-25 DIAGNOSIS — E876 Hypokalemia: Secondary | ICD-10-CM

## 2016-11-25 DIAGNOSIS — Z885 Allergy status to narcotic agent status: Secondary | ICD-10-CM

## 2016-11-25 DIAGNOSIS — Z880 Allergy status to penicillin: Secondary | ICD-10-CM

## 2016-11-25 DIAGNOSIS — Z888 Allergy status to other drugs, medicaments and biological substances status: Secondary | ICD-10-CM

## 2016-11-25 DIAGNOSIS — F0281 Dementia in other diseases classified elsewhere with behavioral disturbance: Secondary | ICD-10-CM | POA: Diagnosis present

## 2016-11-25 DIAGNOSIS — Z90710 Acquired absence of both cervix and uterus: Secondary | ICD-10-CM

## 2016-11-25 DIAGNOSIS — Z7952 Long term (current) use of systemic steroids: Secondary | ICD-10-CM

## 2016-11-25 DIAGNOSIS — S0083XA Contusion of other part of head, initial encounter: Secondary | ICD-10-CM

## 2016-11-25 DIAGNOSIS — R197 Diarrhea, unspecified: Secondary | ICD-10-CM | POA: Diagnosis not present

## 2016-11-25 DIAGNOSIS — Z9049 Acquired absence of other specified parts of digestive tract: Secondary | ICD-10-CM

## 2016-11-25 DIAGNOSIS — Z79899 Other long term (current) drug therapy: Secondary | ICD-10-CM

## 2016-11-25 DIAGNOSIS — R29810 Facial weakness: Secondary | ICD-10-CM | POA: Diagnosis present

## 2016-11-25 DIAGNOSIS — G309 Alzheimer's disease, unspecified: Secondary | ICD-10-CM | POA: Diagnosis present

## 2016-11-25 DIAGNOSIS — R296 Repeated falls: Secondary | ICD-10-CM | POA: Diagnosis present

## 2016-11-25 DIAGNOSIS — Z87440 Personal history of urinary (tract) infections: Secondary | ICD-10-CM

## 2016-11-25 DIAGNOSIS — F419 Anxiety disorder, unspecified: Secondary | ICD-10-CM | POA: Diagnosis present

## 2016-11-25 DIAGNOSIS — Z85038 Personal history of other malignant neoplasm of large intestine: Secondary | ICD-10-CM

## 2016-11-25 DIAGNOSIS — Z882 Allergy status to sulfonamides status: Secondary | ICD-10-CM

## 2016-11-25 HISTORY — DX: Urinary tract infection, site not specified: N39.0

## 2016-11-25 HISTORY — DX: Repeated falls: R29.6

## 2016-11-25 HISTORY — DX: Orthostatic hypotension: I95.1

## 2016-11-25 HISTORY — DX: Unspecified fall, initial encounter: W19.XXXA

## 2016-11-25 LAB — URINALYSIS, COMPLETE (UACMP) WITH MICROSCOPIC
Bacteria, UA: NONE SEEN
Bilirubin Urine: NEGATIVE
Glucose, UA: NEGATIVE mg/dL
Hgb urine dipstick: NEGATIVE
Ketones, ur: NEGATIVE mg/dL
Nitrite: NEGATIVE
Protein, ur: NEGATIVE mg/dL
Specific Gravity, Urine: 1.017 (ref 1.005–1.030)
pH: 7 (ref 5.0–8.0)

## 2016-11-25 LAB — COMPREHENSIVE METABOLIC PANEL
ALBUMIN: 3 g/dL — AB (ref 3.5–5.0)
ALT: 10 U/L — AB (ref 14–54)
AST: 16 U/L (ref 15–41)
Alkaline Phosphatase: 72 U/L (ref 38–126)
Anion gap: 3 — ABNORMAL LOW (ref 5–15)
BILIRUBIN TOTAL: 0.6 mg/dL (ref 0.3–1.2)
BUN: 22 mg/dL — ABNORMAL HIGH (ref 6–20)
CO2: 32 mmol/L (ref 22–32)
Calcium: 8.5 mg/dL — ABNORMAL LOW (ref 8.9–10.3)
Chloride: 103 mmol/L (ref 101–111)
Creatinine, Ser: 0.84 mg/dL (ref 0.44–1.00)
GFR calc Af Amer: >60 mL/min (ref >60)
GLUCOSE: 127 mg/dL — AB (ref 65–99)
POTASSIUM: 3.2 mmol/L — AB (ref 3.5–5.1)
Sodium: 138 mmol/L (ref 135–145)
TOTAL PROTEIN: 5.9 g/dL — AB (ref 6.5–8.1)

## 2016-11-25 LAB — CBC
HEMATOCRIT: 32.1 % — AB (ref 35.0–47.0)
Hemoglobin: 10.6 g/dL — ABNORMAL LOW (ref 12.0–16.0)
MCH: 27.6 pg (ref 26.0–34.0)
MCHC: 33 g/dL (ref 32.0–36.0)
MCV: 83.7 fL (ref 80.0–100.0)
Platelets: 203 10*3/uL (ref 150–440)
RBC: 3.83 MIL/uL (ref 3.80–5.20)
RDW: 15.1 % — ABNORMAL HIGH (ref 11.5–14.5)
WBC: 6.5 10*3/uL (ref 3.6–11.0)

## 2016-11-25 LAB — TROPONIN I: Troponin I: <0.03 ng/mL (ref <0.03)

## 2016-11-25 LAB — MRSA PCR SCREENING: MRSA by PCR: NEGATIVE

## 2016-11-25 MED ORDER — POTASSIUM CHLORIDE CRYS ER 20 MEQ PO TBCR
40.0000 meq | EXTENDED_RELEASE_TABLET | Freq: Once | ORAL | Status: DC
  Filled 2016-11-25: qty 2

## 2016-11-25 MED ORDER — LORAZEPAM 0.5 MG PO TABS
0.2500 mg | ORAL_TABLET | Freq: Four times a day (QID) | ORAL | Status: DC | PRN
  Administered 2016-11-30: 0.25 mg via ORAL
  Filled 2016-11-25: qty 1

## 2016-11-25 MED ORDER — SODIUM CHLORIDE 0.9 % IV SOLN: 250.0000 mL | INTRAVENOUS | Status: DC | PRN

## 2016-11-25 MED ORDER — MELATONIN 5 MG PO TABS
5.0000 mg | ORAL_TABLET | Freq: Every day | ORAL | Status: DC
  Administered 2016-11-27 – 2016-11-29 (×3): 5 mg via ORAL
  Filled 2016-11-25 (×6): qty 1

## 2016-11-25 MED ORDER — LORAZEPAM 0.5 MG PO TABS
0.5000 mg | ORAL_TABLET | Freq: Three times a day (TID) | ORAL | Status: DC
  Administered 2016-11-26 – 2016-11-30 (×12): 0.5 mg via ORAL
  Filled 2016-11-25 (×13): qty 1

## 2016-11-25 MED ORDER — ADULT MULTIVITAMIN W/MINERALS CH
1.0000 | ORAL_TABLET | Freq: Every day | ORAL | Status: DC
  Administered 2016-11-26 – 2016-11-30 (×5): 1 via ORAL
  Filled 2016-11-25 (×6): qty 1

## 2016-11-25 MED ORDER — ACETAMINOPHEN 650 MG RE SUPP: 650.0000 mg | Freq: Four times a day (QID) | RECTAL | Status: DC | PRN

## 2016-11-25 MED ORDER — QUETIAPINE FUMARATE 100 MG PO TABS
100.0000 mg | ORAL_TABLET | Freq: Two times a day (BID) | ORAL | Status: DC
  Administered 2016-11-26 – 2016-11-30 (×9): 100 mg via ORAL
  Filled 2016-11-25 (×4): qty 4
  Filled 2016-11-25: qty 1
  Filled 2016-11-25 (×4): qty 4
  Filled 2016-11-25: qty 1
  Filled 2016-11-25: qty 4
  Filled 2016-11-25: qty 1

## 2016-11-25 MED ORDER — BUSPIRONE HCL 10 MG PO TABS
10.0000 mg | ORAL_TABLET | Freq: Two times a day (BID) | ORAL | Status: DC
  Administered 2016-11-26 – 2016-11-30 (×9): 10 mg via ORAL
  Filled 2016-11-25 (×11): qty 1

## 2016-11-25 MED ORDER — FLUDROCORTISONE ACETATE 0.1 MG PO TABS
0.1000 mg | ORAL_TABLET | Freq: Two times a day (BID) | ORAL | Status: DC
  Administered 2016-11-26 – 2016-11-30 (×9): 0.1 mg via ORAL
  Filled 2016-11-25 (×10): qty 1

## 2016-11-25 MED ORDER — ENOXAPARIN SODIUM 40 MG/0.4ML ~~LOC~~ SOLN
40.0000 mg | SUBCUTANEOUS | Status: DC
  Administered 2016-11-26 – 2016-11-29 (×4): 40 mg via SUBCUTANEOUS
  Filled 2016-11-25 (×5): qty 0.4

## 2016-11-25 MED ORDER — SODIUM CHLORIDE 0.9 % IV BOLUS (SEPSIS)
1000.0000 mL | Freq: Once | INTRAVENOUS | Status: AC
  Administered 2016-11-25: 1000 mL via INTRAVENOUS

## 2016-11-25 MED ORDER — POLYETHYLENE GLYCOL 3350 17 G PO PACK
17.0000 g | PACK | Freq: Every day | ORAL | Status: DC
  Administered 2016-11-26 – 2016-11-27 (×2): 17 g via ORAL
  Filled 2016-11-25 (×2): qty 1

## 2016-11-25 MED ORDER — SODIUM CHLORIDE 0.9% FLUSH
3.0000 mL | Freq: Two times a day (BID) | INTRAVENOUS | Status: DC
  Administered 2016-11-26 – 2016-11-30 (×9): 3 mL via INTRAVENOUS

## 2016-11-25 MED ORDER — SODIUM CHLORIDE 0.9% FLUSH: 3.0000 mL | INTRAVENOUS | Status: DC | PRN

## 2016-11-25 MED ORDER — ACETAMINOPHEN 325 MG PO TABS: 650.0000 mg | ORAL_TABLET | Freq: Four times a day (QID) | ORAL | Status: DC | PRN

## 2016-11-25 MED ORDER — VENLAFAXINE HCL ER 75 MG PO CP24
150.0000 mg | ORAL_CAPSULE | Freq: Every day | ORAL | Status: DC
  Administered 2016-11-26 – 2016-11-30 (×5): 150 mg via ORAL
  Filled 2016-11-25 (×6): qty 2

## 2016-11-25 NOTE — ED Provider Notes (Addendum)
[facility] Emergency Department Provider Note  ____________________________________________  Time seen: Approximately 9:49 AM  I have reviewed the triage vital signs and the nursing notes.   HISTORY  Chief Complaint Fall    HPI [name] is a 83 y.o. female with advanced Alzheimer's dementia, recurrent syncope, orthostatic hypotension, recurrent falls, recurrent UTIs, but to the emergency department for forehead contusion of unclear etiology. The patient is unable to give me any history and is nonverbal other than occasional moaning on my examination. The history is obtained from EMS, who reports that the patient's hospice nurse came to [facility] to bathe her this morning when she noted a large contusion to the right forehead. The patient did not have any witnessed trauma and is unable to give any history.  On my examination, the patient is protecting her airway but she does have a large right forehead contusion with hypotension to 93/57. She has a right facial droop.   Past Medical History:  Diagnosis Date  . Alzheimer's dementia   . Anxiety    H/o anxiety, would use as needed diazepam with relief  . Colon cancer [facility])    h/o colon CA 2010- colectomy 2010  (Dr. Lamonte Sakai with oncology)  . Falls   . Orthostasis    h/o orthostasis, didn't tolerate midodrine, prev eval by Dr. Marlou Porch  . Orthostatic hypotension   . PONV (postoperative nausea and vomiting)   . Thyroid disease    H/o thyroid nodule with benign bx 2010  . UTI (urinary tract infection)     Patient Active Problem List   Diagnosis Date Noted  . Fall 07/05/2016  . Visual hallucinations 03/28/2013  . Depression 01/12/2013  . Alzheimer's dementia 06/23/2012  . Other constipation 03/22/2011  . ADENOCARCINOMA, COLON 07/15/2010  . ANXIETY STATE, UNSPECIFIED 07/15/2010  . POSTURAL HYPOTENSION 07/15/2010    Past Surgical History:  Procedure Laterality Date  . ABDOMINAL HYSTERECTOMY    .  PARTIAL COLECTOMY  2010   for colon CA  . US ECHOCARDIOGRAPHY  [id number]   normal    Current Outpatient Rx  . Order #: [id number] Class: Historical Med  . Order #: [id number] Class: Historical Med  . Order #: [id number] Class: Historical Med  . Order #: [id number] Class: Historical Med  . Order #: [id number] Class: Historical Med  . Order #: [id number] Class: Historical Med  . Order #: [id number] Class: Historical Med  . Order #: [id number] Class: Historical Med  . Order #: [id number] Class: Historical Med    Allergies Aricept [donepezil hcl]; Codeine; Erythromycin; Penicillins; and Sulfonamide derivatives  History reviewed. No pertinent family history.  Social History Social History  Substance Use Topics  . Smoking status: Never Smoker  . Smokeless tobacco: Never Used  . Alcohol use No     Comment: rare    Review of Systems Unable to obtain due to patient condition.  ____________________________________________   PHYSICAL EXAM:  VITAL SIGNS: ED Triage Vitals  Enc Vitals Group     BP 11/25/16 0919 (!) 115/50     Pulse Rate 11/25/16 0919 76     Resp 11/25/16 0919 16     Temp 11/25/16 0919 98 F (36.7 C)     Temp Source 11/25/16 0919 Oral     SpO2 11/25/16 0919 94 %     Weight 11/25/16 0920 150 lb (68 kg)     Height 11/25/16 0920 5\' 5"  (1.651 m)     Head Circumference --      Peak Flow --  Pain Score --      Pain Loc --      Pain Edu? --      Excl. in [place]? --     Constitutional: The patient has her eyes closed and does respond to some verbal stimulus with moaning but does not open her eyes. She does not follow basic commands. He is protecting her airway at this time.  Eyes: Conjunctivae are normal.  EOMI. PERRLA. No scleral icterus. Head: 4 x 4 centimeter area of contusion with superficial abrasion, and significant swelling and ecchymosis over the right forehead. No raccoon eyes or Battle sign. Nose: No congestion/rhinnorhea. No swelling over the nose or septal  hematoma. Mouth/Throat: Mucous membranes are moist. No obvious dental injury or malocclusion. Neck: No stridor.  Supple.  No meningismus. Cardiovascular: Normal rate, regular rhythm. No murmurs, rubs or gallops.  Respiratory: Normal respiratory effort.  No accessory muscle use or retractions. Lungs CTAB.  No wheezes, rales or ronchi. Gastrointestinal: Soft, nontender and nondistended.  No guarding or rebound.  No peritoneal signs. Musculoskeletal: Bilateral symmetric lower extremity edema. No ttp in the calves or palpable cords.  Negative Homan's sign. Neurologic:  the patient has her eyes closed but does respond to verbal stimulus. She does not feel any basic commands. Her face has a loss of nasolabial fold on the right with a right facial droop. She cannot comply with tongue examination. She gives poor effort with equal squeeze of grip bilaterally, she does not respond to instructions for motor exam of her bilateral lower extremities, but does withdraw to pain on both sides. She cannot comply with cerebellar testing or sensation testing. Skin:  Skin is warm, dry and intact. No rash noted.   ____________________________________________   LABS (all labs ordered are listed, but only abnormal results are displayed)  Labs Reviewed  CBC - Abnormal; Notable for the following:       Result Value   Hemoglobin 10.6 (*)    HCT 32.1 (*)    RDW 15.1 (*)    All other components within normal limits  COMPREHENSIVE METABOLIC PANEL - Abnormal; Notable for the following:    Potassium 3.2 (*)    Glucose, Bld 127 (*)    BUN 22 (*)    Calcium 8.5 (*)    Total Protein 5.9 (*)    Albumin 3.0 (*)    ALT 10 (*)    Anion gap 3 (*)    All other components within normal limits  URINALYSIS, COMPLETE (UACMP) WITH MICROSCOPIC - Abnormal; Notable for the following:    Color, Urine YELLOW (*)    APPearance HAZY (*)    Leukocytes, UA TRACE (*)    Squamous Epithelial / LPF 0-5 (*)    All other components within  normal limits  TROPONIN I   ____________________________________________  EKG  ED ECG REPORT I, Eula Listen, the attending physician, personally viewed and interpreted this ECG.   Date: 11/25/2016  EKG Time: 1039  Rate: 68  Rhythm: normal sinus rhythm  Axis: normal  Intervals:none  ST&T Change: No STEMI  ____________________________________________  RADIOLOGY  Dg Chest 2 View  Result Date: 11/25/2016 CLINICAL DATA:  Recent fall EXAM: CHEST  2 VIEW COMPARISON:  03/08/2016 FINDINGS: Cardiac shadow is mildly enlarged but stable. Tortuosity of the thoracic aorta is again seen. The lungs are well aerated bilaterally with minimal bibasilar atelectasis. No focal confluent infiltrate or effusion is seen. No acute bony abnormality is noted. IMPRESSION: Mild bibasilar atelectasis. Electronically Signed  By: Inez Catalina M.D.   On: 11/25/2016 10:24   Ct Head Wo Contrast  Result Date: 11/25/2016 CLINICAL DATA:  83 year old female with large hematoma on the right side of the forehead. Presumed trauma from fall. EXAM: CT HEAD WITHOUT CONTRAST CT CERVICAL SPINE WITHOUT CONTRAST TECHNIQUE: Multidetector CT imaging of the head and cervical spine was performed following the standard protocol without intravenous contrast. Multiplanar CT image reconstructions of the cervical spine were also generated. COMPARISON:  Head and cervical spine CT 07/05/2016. FINDINGS: CT HEAD FINDINGS Brain: Mild cerebral atrophy. Patchy and confluent areas of decreased attenuation are noted throughout the deep and periventricular white matter of the cerebral hemispheres bilaterally, compatible with chronic microvascular ischemic disease. No evidence of acute infarction, hemorrhage, hydrocephalus, extra-axial collection or mass lesion/mass effect. Vascular: No hyperdense vessel or unexpected calcification. Skull: Normal. Negative for fracture or focal lesion. Sinuses/Orbits: No acute finding. Mild multifocal mucosal  thickening in the right ethmoid sinuses. Other: Large amount high attenuation soft tissue swelling in the right frontal scalp, compatible with the reported scalp hematoma. CT CERVICAL SPINE FINDINGS Alignment: Normal. Skull base and vertebrae: No acute fracture. No primary bone lesion or focal pathologic process. Soft tissues and spinal canal: No prevertebral fluid or swelling. No visible canal hematoma. Disc levels: Mild multilevel degenerative disc disease, most pronounced at C5-C6 and C6-C7. Mild multilevel facet arthropathy. Upper chest: Multiple large heterogeneous appearing nodules within the thyroid gland, largest of which is in the left lobe of the gland measuring up to 3.8 x 2.4 cm. Other: None. IMPRESSION: 1. No evidence of significant acute traumatic injury to the skull, brain or cervical spine. 2. Mild cerebral atrophy with extensive chronic microvascular ischemic changes in the cerebral white matter, as above. 3. Mild multilevel degenerative disc disease and cervical spondylosis. 4. Multiple large nodules in the thyroid gland, similar to prior studies, presumably a multinodular goiter. These have been stable for several years. Electronically Signed   By: Vinnie Langton M.D.   On: 11/25/2016 10:33   Ct Cervical Spine Wo Contrast  Result Date: 11/25/2016 CLINICAL DATA:  83 year old female with large hematoma on the right side of the forehead. Presumed trauma from fall. EXAM: CT HEAD WITHOUT CONTRAST CT CERVICAL SPINE WITHOUT CONTRAST TECHNIQUE: Multidetector CT imaging of the head and cervical spine was performed following the standard protocol without intravenous contrast. Multiplanar CT image reconstructions of the cervical spine were also generated. COMPARISON:  Head and cervical spine CT 07/05/2016. FINDINGS: CT HEAD FINDINGS Brain: Mild cerebral atrophy. Patchy and confluent areas of decreased attenuation are noted throughout the deep and periventricular white matter of the cerebral hemispheres  bilaterally, compatible with chronic microvascular ischemic disease. No evidence of acute infarction, hemorrhage, hydrocephalus, extra-axial collection or mass lesion/mass effect. Vascular: No hyperdense vessel or unexpected calcification. Skull: Normal. Negative for fracture or focal lesion. Sinuses/Orbits: No acute finding. Mild multifocal mucosal thickening in the right ethmoid sinuses. Other: Large amount high attenuation soft tissue swelling in the right frontal scalp, compatible with the reported scalp hematoma. CT CERVICAL SPINE FINDINGS Alignment: Normal. Skull base and vertebrae: No acute fracture. No primary bone lesion or focal pathologic process. Soft tissues and spinal canal: No prevertebral fluid or swelling. No visible canal hematoma. Disc levels: Mild multilevel degenerative disc disease, most pronounced at C5-C6 and C6-C7. Mild multilevel facet arthropathy. Upper chest: Multiple large heterogeneous appearing nodules within the thyroid gland, largest of which is in the left lobe of the gland measuring up to 3.8 x 2.4 cm.  Other: None. IMPRESSION: 1. No evidence of significant acute traumatic injury to the skull, brain or cervical spine. 2. Mild cerebral atrophy with extensive chronic microvascular ischemic changes in the cerebral white matter, as above. 3. Mild multilevel degenerative disc disease and cervical spondylosis. 4. Multiple large nodules in the thyroid gland, similar to prior studies, presumably a multinodular goiter. These have been stable for several years. Electronically Signed   By: Vinnie Langton M.D.   On: 11/25/2016 10:33    ____________________________________________   PROCEDURES  Procedure(s) performed: None  Procedures  Critical Care performed: Yes ____________________________________________   INITIAL IMPRESSION / ASSESSMENT AND PLAN / ED COURSE  Pertinent labs & imaging results that were available during my care of the patient were reviewed by me and  considered in my medical decision making (see chart for details).  83 y.o. female with advanced Alzheimer's dementia, and a history of orthostatic hypotension, recurrent syncope and recurrent UTIs presenting with a contusion of unknown etiology. I'm concerned the patient has sustained some kind of trauma, and we'll get a CT scan of her head and neck. I do not see evidence of other acute trauma at this time. I am concerned about her mild hypotension with a blood pressure of 93/57 here, and we'll administer intravenous fluids. Given her history of orthostatic hypotension this may be within the patient's normal limits. We will also check for UTI, as well as basic blood work, including troponin. EKG is pending. Plan reevaluation for final disposition.  ----------------------------------------- 12:20 PM on 11/25/2016 -----------------------------------------  The patient has a right forehead contusion but her CT scan does not show any acute skull fracture or intracranial process. She does not have any C-spine fracture. Her laboratory studies are reassuring, with a small exception of a potassium of 3.2 which has been supplemented orally. At this time, the patient is stable for discharge. She'll follow up with her primary care physician in the next 1-2 days. Return precautions were typed and discussed.  ----------------------------------------- 12:33 PM on 11/25/2016 -----------------------------------------  The patient is ready for discharge, and when I go to the bedside, there is a hospice nurse there who states that the patient's daughter is wondering if she should be moved from [facility] to a skilled nursing facility. Unfortunately, the patient's daughter is not available for discussion and is not present at the base bedside. Personally, I am unfamiliar with the memory care unit at [facility], but will initiate social work consult and physical therapy consultation to proceed with answering this  question.  CRITICAL CARE Performed by: Eula Listen   Total critical care time: 35 minutes  Critical care time was exclusive of separately billable procedures and treating other patients.  Critical care was necessary to treat or prevent imminent or life-threatening deterioration.  Critical care was time spent personally by me on the following activities: development of treatment plan with patient and/or surrogate as well as nursing, discussions with consultants, evaluation of patient's response to treatment, examination of patient, obtaining history from patient or surrogate, ordering and performing treatments and interventions, ordering and review of laboratory studies, ordering and review of radiographic studies, pulse oximetry and re-evaluation of patient's condition.   ____________________________________________  FINAL CLINICAL IMPRESSION(S) / ED DIAGNOSES  Final diagnoses:  Contusion of forehead, initial encounter  Hypokalemia         NEW MEDICATIONS STARTED DURING THIS VISIT:  New Prescriptions   No medications on file      Eula Listen, MD 11/25/16 1223  Eula Listen, MD 11/25/16 1234    Eula Listen, MD 12/09/16 3419

## 2016-11-25 NOTE — ED Notes (Signed)
Family notified that patient would be moved to room 109.  Patient taken via stretcher by Tammy, tech.

## 2016-11-25 NOTE — ED Notes (Signed)
Telephone report called to Butch Penny, Therapist, sports.

## 2016-11-25 NOTE — ED Triage Notes (Signed)
Patient comes in via [place] from [facility].  Per EMS hospice came in to bath [name] and found large hematoma to right forehead.  Hospice nurse asked nursing staff at [facility] if [name] had a fall and they denied knowing of a fall.  [facility] staff called EMS per protocol.  Per EMS [facility] staff knew nothing of how injury happened.  Per [facility] staff patient is at baseline.

## 2016-11-25 NOTE — ED Notes (Signed)
In and out catheterization preformed to obtain urinalysis sample.  [facility] assisted.

## 2016-11-25 NOTE — Discharge Instructions (Signed)
Please take all precautions to prevent falls. Please apply a Neosporin and a thick coat 3 times daily until the patient's forehead abrasion has completely healed.  Please make a follow-up appointment with the patient's primary care physician in the next 1-2 days.  Please return to the emergency department for changes in mental status, severe pain, vomiting, inability to walk, or any other symptoms concerning to you.

## 2016-11-25 NOTE — ED Provider Notes (Signed)
The patient was not able to be returned to his assisted living due to increased care needs. Because of this, the patient was not able to be discharged to a safe disposition. Social worker's consult as well as physical therapy, social work is working to arrange for the patient to go to an increased level of care facility. Unfortunately, we do not anticipate this will be available today, and the patient will be admitted for observation as well as social work and placement concerns at this time.   Delman Kitten, MD 11/25/16 [phone]

## 2016-11-25 NOTE — H&P (Signed)
[facility] [place] NAME: [name]    MR#:  [id number]  DATE OF BIRTH:  1928/05/02  DATE OF ADMISSION:  11/25/2016  PRIMARY CARE PHYSICIAN: No PCP Per Patient   REQUESTING/REFERRING PHYSICIAN: Delman Kitten mD  CHIEF COMPLAINT:   Chief Complaint  Patient presents with  . Fall    HISTORY OF PRESENT ILLNESS: [name]  is a 83 y.o. female with a known history of Alzheimer's dementia, anxiety, orthostatic hypotension who has had recurrent falls. Patient was brought to the emergency room for forehead contusion of unclear etiology. Patient currently resides in assisted living. And they state that because of her behavioral issues and her recurrent falls they will not be able to take her back. She was seen by social worker in the emergency room stated that patient will require a few days in the hospital and can be placed in a skilled nursing facility. Patient has advanced dementia and unable to provide any review of systems her daughter is at bedside she reports that her mother has had progressive decline in her dementia. Her orthostatic hypotension is better. She also has had recurrent falls.    PAST MEDICAL HISTORY:    Past Medical History:  Diagnosis Date  . Alzheimer's dementia   . Anxiety    H/o anxiety, would use as needed diazepam with relief  . Colon cancer [facility])    h/o colon CA 2010- colectomy 2010  (Dr. Lamonte Sakai with oncology)  . Falls   . Orthostasis    h/o orthostasis, didn't tolerate midodrine, prev eval by Dr. Marlou Porch  . Orthostatic hypotension   . PONV (postoperative nausea and vomiting)   . Thyroid disease    H/o thyroid nodule with benign bx 2010  . UTI (urinary tract infection)     PAST SURGICAL HISTORY: Past Surgical History:  Procedure Laterality Date  . ABDOMINAL HYSTERECTOMY    . PARTIAL COLECTOMY  2010   for colon CA  . US ECHOCARDIOGRAPHY  [id number]   normal    SOCIAL HISTORY:  Social History  Substance Use  Topics  . Smoking status: Never Smoker  . Smokeless tobacco: Never Used  . Alcohol use No     Comment: rare    FAMILY HISTORY: History reviewed. No pertinent family history.  DRUG ALLERGIES:  Allergies  Allergen Reactions  . Aricept [Donepezil Hcl]     Increased hallucinations   . Codeine   . Erythromycin     REACTION: GI upset  . Penicillins Other (See Comments)    Has patient had a PCN reaction causing immediate rash, facial/tongue/throat swelling, SOB or lightheadedness with hypotension: UNK  Has patient had a PCN reaction causing severe rash involving mucus membranes or skin necrosis: UNK Has patient had a PCN reaction that required hospitalization: UNK Has patient had a PCN reaction occurring within the last 10 years: UNK If all of the above answers are "NO", then may proceed with Cephalosporin use. ** ALLERGY LISTED ON MAR, UNKNOWN REACTION**  . Sulfonamide Derivatives     REACTION: GI upset    REVIEW OF SYSTEMS:   CONSTITUTIONAL: Able to provide due to dementia   MEDICATIONS AT HOME:  Prior to Admission medications   Medication Sig Start Date End Date Taking? Authorizing Provider  busPIRone (BUSPAR) 10 MG tablet Take 10 mg by mouth 2 (two) times daily.   Yes Historical Provider, MD  fludrocortisone (FLORINEF) 0.1 MG tablet Take 0.1 mg by mouth 2 (two) times daily.  Yes Historical Provider, MD  LORazepam (ATIVAN) 0.5 MG tablet Take 0.5 mg by mouth 3 (three) times daily.   Yes Historical Provider, MD  Melatonin 3 MG TABS Take 3 mg by mouth at bedtime.   Yes Historical Provider, MD  Multiple Vitamin (MULTIVITAMIN WITH MINERALS) TABS tablet Take 1 tablet by mouth daily.   Yes Historical Provider, MD  polyethylene glycol (MIRALAX / GLYCOLAX) packet Take 17 g by mouth daily.   Yes Historical Provider, MD  QUEtiapine (SEROQUEL) 100 MG tablet Take 100 mg by mouth 2 (two) times daily.    Yes Historical Provider, MD  venlafaxine XR (EFFEXOR-XR) 150 MG 24 hr capsule Take 150 mg  by mouth daily with breakfast.   Yes Historical Provider, MD  LORazepam (ATIVAN) 0.5 MG tablet Take 0.25 mg by mouth every 6 (six) hours as needed for anxiety.    Historical Provider, MD      PHYSICAL EXAMINATION:   VITAL SIGNS: Blood pressure (!) 160/84, pulse 78, temperature 98 F (36.7 C), temperature source Oral, resp. rate 14, height 5\' 5"  (1.651 m), weight 150 lb (68 kg), SpO2 96 %.  GENERAL:  83 y.o.-year-old patient lying in the bed with no acute distress.  EYES: Pupils equal, round, reactive to light and accommodation. No scleral icterus.  HEENT: Head atraumatic, normocephalic. Oropharynx and nasopharynx clear. The contusion on her forehead on the right NECK:  Supple, no jugular venous distention. No thyroid enlargement, no tenderness.  LUNGS: Normal breath sounds bilaterally, no wheezing, rales,rhonchi or crepitation. No use of accessory muscles of respiration.  CARDIOVASCULAR: S1, S2 normal. No murmurs, rubs, or gallops.  ABDOMEN: Soft, nontender, nondistended. Bowel sounds present. No organomegaly or mass.  EXTREMITIES: No pedal edema, cyanosis, or clubbing.  NEUROLOGIC: Cranial nerves II through XII are intact. Muscle strength 5/5 in all extremities. Sensation intact. Gait not checked.  PSYCHIATRIC: The patient is alert and oriented x 3.  SKIN: No obvious rash, lesion, or ulcer.   LABORATORY PANEL:   CBC  Recent Labs Lab 11/25/16 0953  WBC 6.5  HGB 10.6*  HCT 32.1*  PLT 203  MCV 83.7  MCH 27.6  MCHC 33.0  RDW 15.1*   ------------------------------------------------------------------------------------------------------------------  Chemistries   Recent Labs Lab 11/25/16 0953  NA 138  K 3.2*  CL 103  CO2 32  GLUCOSE 127*  BUN 22*  CREATININE 0.84  CALCIUM 8.5*  AST 16  ALT 10*  ALKPHOS 72  BILITOT 0.6   ------------------------------------------------------------------------------------------------------------------ estimated creatinine clearance  is 43.3 mL/min (by C-G formula based on SCr of 0.84 mg/dL). ------------------------------------------------------------------------------------------------------------------ No results for input(s): TSH, T4TOTAL, T3FREE, THYROIDAB in the last 72 hours.  Invalid input(s): FREET3   Coagulation profile No results for input(s): INR, PROTIME in the last 168 hours. ------------------------------------------------------------------------------------------------------------------- No results for input(s): DDIMER in the last 72 hours. -------------------------------------------------------------------------------------------------------------------  Cardiac Enzymes  Recent Labs Lab 11/25/16 0953  TROPONINI <0.03   ------------------------------------------------------------------------------------------------------------------ Invalid input(s): POCBNP  ---------------------------------------------------------------------------------------------------------------  Urinalysis    Component Value Date/Time   COLORURINE YELLOW (A) 11/25/2016 1043   APPEARANCEUR HAZY (A) 11/25/2016 1043   APPEARANCEUR HAZY 08/02/2014 1000   LABSPEC 1.017 11/25/2016 1043   LABSPEC 1.025 08/02/2014 1000   PHURINE 7.0 11/25/2016 1043   GLUCOSEU NEGATIVE 11/25/2016 1043   GLUCOSEU NEGATIVE 08/02/2014 1000   HGBUR NEGATIVE 11/25/2016 1043   BILIRUBINUR NEGATIVE 11/25/2016 1043   BILIRUBINUR NEGATIVE 08/02/2014 1000   KETONESUR NEGATIVE 11/25/2016 1043   PROTEINUR NEGATIVE 11/25/2016 1043   UROBILINOGEN 0.2 08/03/2013 1321  NITRITE NEGATIVE 11/25/2016 1043   LEUKOCYTESUR TRACE (A) 11/25/2016 1043   LEUKOCYTESUR NEGATIVE 08/02/2014 1000     RADIOLOGY: Dg Chest 2 View  Result Date: 11/25/2016 CLINICAL DATA:  Recent fall EXAM: CHEST  2 VIEW COMPARISON:  03/08/2016 FINDINGS: Cardiac shadow is mildly enlarged but stable. Tortuosity of the thoracic aorta is again seen. The lungs are well aerated bilaterally  with minimal bibasilar atelectasis. No focal confluent infiltrate or effusion is seen. No acute bony abnormality is noted. IMPRESSION: Mild bibasilar atelectasis. Electronically Signed   By: Inez Catalina M.D.   On: 11/25/2016 10:24   Ct Head Wo Contrast  Result Date: 11/25/2016 CLINICAL DATA:  83 year old female with large hematoma on the right side of the forehead. Presumed trauma from fall. EXAM: CT HEAD WITHOUT CONTRAST CT CERVICAL SPINE WITHOUT CONTRAST TECHNIQUE: Multidetector CT imaging of the head and cervical spine was performed following the standard protocol without intravenous contrast. Multiplanar CT image reconstructions of the cervical spine were also generated. COMPARISON:  Head and cervical spine CT 07/05/2016. FINDINGS: CT HEAD FINDINGS Brain: Mild cerebral atrophy. Patchy and confluent areas of decreased attenuation are noted throughout the deep and periventricular white matter of the cerebral hemispheres bilaterally, compatible with chronic microvascular ischemic disease. No evidence of acute infarction, hemorrhage, hydrocephalus, extra-axial collection or mass lesion/mass effect. Vascular: No hyperdense vessel or unexpected calcification. Skull: Normal. Negative for fracture or focal lesion. Sinuses/Orbits: No acute finding. Mild multifocal mucosal thickening in the right ethmoid sinuses. Other: Large amount high attenuation soft tissue swelling in the right frontal scalp, compatible with the reported scalp hematoma. CT CERVICAL SPINE FINDINGS Alignment: Normal. Skull base and vertebrae: No acute fracture. No primary bone lesion or focal pathologic process. Soft tissues and spinal canal: No prevertebral fluid or swelling. No visible canal hematoma. Disc levels: Mild multilevel degenerative disc disease, most pronounced at C5-C6 and C6-C7. Mild multilevel facet arthropathy. Upper chest: Multiple large heterogeneous appearing nodules within the thyroid gland, largest of which is in the left  lobe of the gland measuring up to 3.8 x 2.4 cm. Other: None. IMPRESSION: 1. No evidence of significant acute traumatic injury to the skull, brain or cervical spine. 2. Mild cerebral atrophy with extensive chronic microvascular ischemic changes in the cerebral white matter, as above. 3. Mild multilevel degenerative disc disease and cervical spondylosis. 4. Multiple large nodules in the thyroid gland, similar to prior studies, presumably a multinodular goiter. These have been stable for several years. Electronically Signed   By: Vinnie Langton M.D.   On: 11/25/2016 10:33   Ct Cervical Spine Wo Contrast  Result Date: 11/25/2016 CLINICAL DATA:  83 year old female with large hematoma on the right side of the forehead. Presumed trauma from fall. EXAM: CT HEAD WITHOUT CONTRAST CT CERVICAL SPINE WITHOUT CONTRAST TECHNIQUE: Multidetector CT imaging of the head and cervical spine was performed following the standard protocol without intravenous contrast. Multiplanar CT image reconstructions of the cervical spine were also generated. COMPARISON:  Head and cervical spine CT 07/05/2016. FINDINGS: CT HEAD FINDINGS Brain: Mild cerebral atrophy. Patchy and confluent areas of decreased attenuation are noted throughout the deep and periventricular white matter of the cerebral hemispheres bilaterally, compatible with chronic microvascular ischemic disease. No evidence of acute infarction, hemorrhage, hydrocephalus, extra-axial collection or mass lesion/mass effect. Vascular: No hyperdense vessel or unexpected calcification. Skull: Normal. Negative for fracture or focal lesion. Sinuses/Orbits: No acute finding. Mild multifocal mucosal thickening in the right ethmoid sinuses. Other: Large amount high attenuation soft tissue swelling in  the right frontal scalp, compatible with the reported scalp hematoma. CT CERVICAL SPINE FINDINGS Alignment: Normal. Skull base and vertebrae: No acute fracture. No primary bone lesion or focal  pathologic process. Soft tissues and spinal canal: No prevertebral fluid or swelling. No visible canal hematoma. Disc levels: Mild multilevel degenerative disc disease, most pronounced at C5-C6 and C6-C7. Mild multilevel facet arthropathy. Upper chest: Multiple large heterogeneous appearing nodules within the thyroid gland, largest of which is in the left lobe of the gland measuring up to 3.8 x 2.4 cm. Other: None. IMPRESSION: 1. No evidence of significant acute traumatic injury to the skull, brain or cervical spine. 2. Mild cerebral atrophy with extensive chronic microvascular ischemic changes in the cerebral white matter, as above. 3. Mild multilevel degenerative disc disease and cervical spondylosis. 4. Multiple large nodules in the thyroid gland, similar to prior studies, presumably a multinodular goiter. These have been stable for several years. Electronically Signed   By: Vinnie Langton M.D.   On: 11/25/2016 10:33    EKG: Orders placed or performed during the hospital encounter of 11/25/16  . ED EKG  . ED EKG  . EKG 12-Lead  . EKG 12-Lead  . EKG 12-Lead  . EKG 12-Lead    IMPRESSION AND PLAN: Patient's 83 year old with recurrent falls and orthostatic hypotension  1. Recurrent falls due to orthostatic hypotension Patient is being admitted to skilled nursing facility placement has Arty been seen by social worker will be working her up for nursing facility  2. Advanced dementia with behavioral disturbances We'll continue buspirone, lorazepam and seroquel and Effexor  3. Orthostatic hypotension continue Florinef  4. CODE STATUS DO NOT RESUSCITATE confirmed with family patient to remain DO NOT RESUSCITATE       All the records are reviewed and case discussed with ED provider. Management plans discussed with the patient, family and they are in agreement.  CODE STATUS:    Code Status Orders        Start     Ordered   11/25/16 1713  Do not attempt resuscitation (DNR)   Continuous    Question Answer Comment  In the event of cardiac or respiratory ARREST Do not call a "code blue"   In the event of cardiac or respiratory ARREST Do not perform Intubation, CPR, defibrillation or ACLS   In the event of cardiac or respiratory ARREST Use medication by any route, position, wound care, and other measures to relive pain and suffering. May use oxygen, suction and manual treatment of airway obstruction as needed for comfort.      11/25/16 1712    Code Status History    Date Active Date Inactive Code Status Order ID Comments User Context   07/05/2016  9:48 PM 07/06/2016  7:41 PM DNR [id number]  Dustin Flock, MD Inpatient   08/03/2013  3:40 PM 08/07/2013  7:05 PM Full Code [id number]  Costin Karlyne Greenspan, MD ED    Advance Directive Documentation     Most Recent Value  Type of Advance Directive  Out of facility DNR (pink MOST or yellow form)  Pre-existing out of facility DNR order (yellow form or pink MOST form)  Physician notified to receive inpatient order  "MOST" Form in Place?  -       TOTAL TIME TAKING CARE OF THIS PATIENT: 55 minutes.    Dustin Flock M.D on 11/25/2016 at 5:13 PM  Between 7am to 6pm - Pager - [phone]  After 6pm go to [email or web address] - [place]  [facility]  Office  [phone]  CC: Primary care physician; No PCP Per Patient

## 2016-11-25 NOTE — Evaluation (Signed)
Physical Therapy Evaluation Patient Details Name: [name] MRN: [id number] DOB: October 21, 1927 Today's Date: 11/25/2016   History of Present Illness  Pt is an 83 y.o. female presenting to hospital after being found by hospice nurse in bed (at [facility]) with large hematoma to R forehead.  PMH includes alzheimer's dementia, recurrent syncope, orthostatic hypotension, recurrent falls, and recurrent UTI's.  Clinical Impression  Prior to hospital admission, pt was requiring significant assist with bed mobility and transfers (pt w/c level).  Pt lives at [facility].  Currently pt requires significant assist with bed mobility and appearing shaky after sitting on edge of bed for a couple minutes (min to mod assist for balance).  Pt possibly followed a couple commands during session at most and although pt verbalizing a few words during session, pt did not answer any of the PT's questions.  Pt does not appear rehab appropriate in terms of ability to participate and would do better in familiar environment with increased assist (discussed with pt's daughter who has tried rehab in past and pt's daughter agreeing with this); d/t this will discharge pt in house.  Pt's daughter concerned regarding pt's care at facility and wondering about other options available: SW notified.    Follow Up Recommendations No PT follow up;Supervision/Assistance - 24 hour    Equipment Recommendations  None recommended by PT    Recommendations for Other Services       Precautions / Restrictions Precautions Precautions: Fall Restrictions Weight Bearing Restrictions: No      Mobility  Bed Mobility Overal bed mobility: Needs Assistance Bed Mobility: Supine to Sit;Sit to Supine     Supine to sit: Mod assist;+2 for physical assistance;HOB elevated Sit to supine: Mod assist;+2 for physical assistance;HOB elevated   General bed mobility comments: assist for trunk and B  LE's  Transfers                 General transfer comment: Deferred d/t pt starting to shake sitting on edge of bed and pt's daughter reporting that usually was a sign regarding BP issues (BP increased to 179/83; nursing notified).  Ambulation/Gait                Stairs            Wheelchair Mobility    Modified Rankin (Stroke Patients Only)       Balance Overall balance assessment: Needs assistance Sitting-balance support: Bilateral upper extremity supported;Feet unsupported Sitting balance-Leahy Scale: Poor Sitting balance - Comments: min to mod assist for sitting balance Postural control: Posterior lean                                   Pertinent Vitals/Pain Pain Assessment: Faces Faces Pain Scale: No hurt  HR WFL during session.    Home Living Family/patient expects to be discharged to:: Skilled nursing facility                 Additional Comments: Living in a Memory Care Unit at [facility]    Prior Function Level of Independence: Needs assistance   Gait / Transfers Assistance Needed: Per pt's daughter and niece, last couple months pt has required max assist for bed mobility and transfers to chair; pt had been able to walk a couple steps 1-2 months ago w/c to toilet with assist but not recently.  Pt had been able to self  propel with UE's and LE's in w/c but not recently (although pt does try to get up on her own).  ADL's / Homemaking Assistance Needed: Needs assist for all ADLs  Comments: Pt sits in high back reclining w/c with wedge cushion.     Hand Dominance        Extremity/Trunk Assessment   Upper Extremity Assessment Upper Extremity Assessment: Difficult to assess due to impaired cognition    Lower Extremity Assessment Lower Extremity Assessment: Difficult to assess due to impaired cognition       Communication   Communication: No difficulties  Cognition Arousal/Alertness:  (Initially lethargic but woke up  when blanket removed) Behavior During Therapy: Flat affect Overall Cognitive Status:  (Pt did not answer any questions)                                        General Comments General comments (skin integrity, edema, etc.): Pt's daughter and niece present during session; hospice nurse present beginning of session but left during session.  Nursing cleared pt for participation in physical therapy.  Pt's family agreeable to PT session.    Exercises     Assessment/Plan    PT Assessment Patent does not need any further PT services  PT Problem List         PT Treatment Interventions      PT Goals (Current goals can be found in the Care Plan section)  Acute Rehab PT Goals Patient Stated Goal: to improve pt's care at facility PT Goal Formulation: With family Time For Goal Achievement: 12/09/16 Potential to Achieve Goals: Fair    Frequency     Barriers to discharge        Co-evaluation               End of Session   Activity Tolerance: Other (comment) (Limited d/t pt shaking once in sitting) Patient left: in bed;with call bell/phone within reach;with family/visitor present Nurse Communication: Mobility status;Precautions PT Visit Diagnosis: Repeated falls (R29.6);Difficulty in walking, not elsewhere classified (R26.2)    Time: 8421-0312 PT Time Calculation (min) (ACUTE ONLY): 30 min   Charges:   PT Evaluation $PT Eval Low Complexity: 1 Procedure     PT G Codes:   PT G-Codes **NOT FOR INPATIENT CLASS** Functional Assessment Tool Used: AM-PAC 6 Clicks Basic Mobility Functional Limitation: Mobility: Walking and moving around Mobility: Walking and Moving Around Current Status (O1188): 100 percent impaired, limited or restricted Mobility: Walking and Moving Around Goal Status (Q7737): 100 percent impaired, limited or restricted Mobility: Walking and Moving Around Discharge Status (V6681): 100 percent impaired, limited or restricted    Leitha Bleak, PT 11/25/16, 4:58 PM [phone]

## 2016-11-25 NOTE — Progress Notes (Signed)
1928  Pt moved to low bed due to assessment.  Per family she required a sitter last time here. 2152 Pt refusing all meds.  Pt hitting and yelling.  Wasted meds.  Bed in low position and alarm on sensitive setting. Door open and music playing to help pt relax. Dorna Bloom RN

## 2016-11-25 NOTE — ED Notes (Signed)
Dr. Mariea Clonts given EKG

## 2016-11-25 NOTE — ED Notes (Signed)
Dr. Mariea Clonts gave order to discontinue PO potassium due to patient being unable to swallow.

## 2016-11-25 NOTE — ED Notes (Signed)
Physical therapy worked with patient and recommends long term skilled facility.

## 2016-11-25 NOTE — Care Management Note (Signed)
Case Management Note  Patient Details  Name: EDWYNA DANGERFIELD MRN: [id number] Date of Birth: 06-13-1928  Subjective/Objective:    Pt. is serviced by [facility], who is at the bedside. The Hospice staff says the patient daughter initially had paid out of pocket for sitters, and there were no problems. The funds for that are now depleted and the hospice RN sees the patient once a week, while an aide does her bath through them. The nurse is inferring that the staff do not see or administer to the patient as often as they or the daughter would like. They seem to believe that a SNF level is needed. I have tried to start broaching the subject with the RN at bedside, but the daughter is not present yet.Since the patient is in a memory care unit I am not sure what else they can do, but have shared the information so far with CSW Randall Hiss.                 Action/Plan:   Expected Discharge Date:                  Expected Discharge Plan:     In-House Referral:     Discharge planning Services     Post Acute Care Choice:    Choice offered to:     DME Arranged:    DME Agency:     HH Arranged:    HH Agency:     Status of Service:     If discussed at [employer] of Stay Meetings, dates discussed:    Additional Comments:  Beau Fanny, RN 11/25/2016, 12:48 PM

## 2016-11-25 NOTE — ED Notes (Signed)
Freda Munro with [facility] at bedside.

## 2016-11-25 NOTE — Clinical Social Work Note (Signed)
CSW received consult that patient is from [facility], and family is considering possible higher level of care.  CSW will attempt to contact patient's family member to discuss discharge planning pending PT recommendations.  Jones Broom. [place], MSW, [place]  11/25/2016 2:13 PM

## 2016-11-25 NOTE — ED Notes (Signed)
Social worker working to get appropriate paper work together to get patient in skilled nursing facility. Family up to date on steps being taken. Dr. Jacqualine Code notified and patient may need to be admitted while waiting on skilled facility bed.

## 2016-11-26 LAB — BASIC METABOLIC PANEL
Anion gap: 7 (ref 5–15)
BUN: 11 mg/dL (ref 6–20)
CO2: 29 mmol/L (ref 22–32)
CREATININE: 0.64 mg/dL (ref 0.44–1.00)
Calcium: 8.6 mg/dL — ABNORMAL LOW (ref 8.9–10.3)
Chloride: 98 mmol/L — ABNORMAL LOW (ref 101–111)
Glucose, Bld: 88 mg/dL (ref 65–99)
Potassium: 2.9 mmol/L — ABNORMAL LOW (ref 3.5–5.1)
SODIUM: 134 mmol/L — AB (ref 135–145)

## 2016-11-26 LAB — CBC
HCT: 35.2 % (ref 35.0–47.0)
Hemoglobin: 11.8 g/dL — ABNORMAL LOW (ref 12.0–16.0)
MCH: 28.1 pg (ref 26.0–34.0)
MCHC: 33.5 g/dL (ref 32.0–36.0)
MCV: 84 fL (ref 80.0–100.0)
Platelets: 229 10*3/uL (ref 150–440)
RBC: 4.19 MIL/uL (ref 3.80–5.20)
RDW: 15 % — AB (ref 11.5–14.5)
WBC: 9.2 10*3/uL (ref 3.6–11.0)

## 2016-11-26 LAB — MAGNESIUM: MAGNESIUM: 1.6 mg/dL — AB (ref 1.7–2.4)

## 2016-11-26 MED ORDER — SODIUM CHLORIDE 0.9 % IV SOLN
30.0000 meq | INTRAVENOUS | Status: AC
  Administered 2016-11-26 (×2): 30 meq via INTRAVENOUS
  Filled 2016-11-26 (×2): qty 15

## 2016-11-26 NOTE — Progress Notes (Signed)
[facility] NAME: [name]    MR#:  [id number]  DATE OF BIRTH:  1927/12/08  SUBJECTIVE:  CHIEF COMPLAINT:   Chief Complaint  Patient presents with  . Fall   -Admitted from assisted living facility due to falls and worsening behavior from dementia -Refused to take her medications last night. -Pleasantly sleeping at this time.  REVIEW OF SYSTEMS:  Review of Systems  Unable to perform ROS: Dementia    DRUG ALLERGIES:   Allergies  Allergen Reactions  . Aricept [Donepezil Hcl]     Increased hallucinations   . Codeine   . Erythromycin     REACTION: GI upset  . Penicillins Other (See Comments)    Has patient had a PCN reaction causing immediate rash, facial/tongue/throat swelling, SOB or lightheadedness with hypotension: UNK  Has patient had a PCN reaction causing severe rash involving mucus membranes or skin necrosis: UNK Has patient had a PCN reaction that required hospitalization: UNK Has patient had a PCN reaction occurring within the last 10 years: UNK If all of the above answers are "NO", then may proceed with Cephalosporin use. ** ALLERGY LISTED ON MAR, UNKNOWN REACTION**  . Sulfonamide Derivatives     REACTION: GI upset    VITALS:  Blood pressure 121/60, pulse 79, temperature 97.9 F (36.6 C), temperature source Oral, resp. rate 20, height 5\' 5"  (1.651 m), weight 61.5 kg (135 lb 8 oz), SpO2 96 %.  PHYSICAL EXAMINATION:  Physical Exam  GENERAL:  83 y.o.-year-old patient lying in the bed with no acute distress. Sleeping and confused when awakened EYES: Pupils equal, round, reactive to light and accommodation. No scleral icterus. Extraocular muscles intact.  HEENT: Head atraumatic, normocephalic. Oropharynx and nasopharynx clear. There is a contusion on the right side of foreheadNECK:  Supple, no jugular venous distention. No thyroid enlargement, no tenderness.  LUNGS: Normal breath sounds bilaterally, no  wheezing, rales,rhonchi or crepitation. No use of accessory muscles of respiration.  CARDIOVASCULAR: S1, S2 normal. No  rubs, or gallops. 2/6 systolic murmur is present ABDOMEN: Soft, nontender, nondistended. Bowel sounds present. No organomegaly or mass.  EXTREMITIES: No pedal edema, cyanosis, or clubbing.  NEUROLOGIC: Cranial nerves II through XII are intact. Moving all extremities in bed. Sensation intact. Gait not checked.  PSYCHIATRIC: The patient is sleepy, alert to self when aroused. Not oriented. Following commands. SKIN: No obvious rash, lesion, or ulcer.    LABORATORY PANEL:   CBC  Recent Labs Lab 11/26/16 0402  WBC 9.2  HGB 11.8*  HCT 35.2  PLT 229   ------------------------------------------------------------------------------------------------------------------  Chemistries   Recent Labs Lab 11/25/16 0953 11/26/16 0402  NA 138 134*  K 3.2* 2.9*  CL 103 98*  CO2 32 29  GLUCOSE 127* 88  BUN 22* 11  CREATININE 0.84 0.64  CALCIUM 8.5* 8.6*  AST 16  --   ALT 10*  --   ALKPHOS 72  --   BILITOT 0.6  --    ------------------------------------------------------------------------------------------------------------------  Cardiac Enzymes  Recent Labs Lab 11/25/16 0953  TROPONINI <0.03   ------------------------------------------------------------------------------------------------------------------  RADIOLOGY:  Dg Chest 2 View  Result Date: 11/25/2016 CLINICAL DATA:  Recent fall EXAM: CHEST  2 VIEW COMPARISON:  03/08/2016 FINDINGS: Cardiac shadow is mildly enlarged but stable. Tortuosity of the thoracic aorta is again seen. The lungs are well aerated bilaterally with minimal bibasilar atelectasis. No focal confluent infiltrate or effusion is seen. No acute bony abnormality is noted. IMPRESSION: Mild bibasilar  atelectasis. Electronically Signed   By: Inez Catalina M.D.   On: 11/25/2016 10:24   Ct Head Wo Contrast  Result Date: 11/25/2016 CLINICAL DATA:   83 year old female with large hematoma on the right side of the forehead. Presumed trauma from fall. EXAM: CT HEAD WITHOUT CONTRAST CT CERVICAL SPINE WITHOUT CONTRAST TECHNIQUE: Multidetector CT imaging of the head and cervical spine was performed following the standard protocol without intravenous contrast. Multiplanar CT image reconstructions of the cervical spine were also generated. COMPARISON:  Head and cervical spine CT 07/05/2016. FINDINGS: CT HEAD FINDINGS Brain: Mild cerebral atrophy. Patchy and confluent areas of decreased attenuation are noted throughout the deep and periventricular white matter of the cerebral hemispheres bilaterally, compatible with chronic microvascular ischemic disease. No evidence of acute infarction, hemorrhage, hydrocephalus, extra-axial collection or mass lesion/mass effect. Vascular: No hyperdense vessel or unexpected calcification. Skull: Normal. Negative for fracture or focal lesion. Sinuses/Orbits: No acute finding. Mild multifocal mucosal thickening in the right ethmoid sinuses. Other: Large amount high attenuation soft tissue swelling in the right frontal scalp, compatible with the reported scalp hematoma. CT CERVICAL SPINE FINDINGS Alignment: Normal. Skull base and vertebrae: No acute fracture. No primary bone lesion or focal pathologic process. Soft tissues and spinal canal: No prevertebral fluid or swelling. No visible canal hematoma. Disc levels: Mild multilevel degenerative disc disease, most pronounced at C5-C6 and C6-C7. Mild multilevel facet arthropathy. Upper chest: Multiple large heterogeneous appearing nodules within the thyroid gland, largest of which is in the left lobe of the gland measuring up to 3.8 x 2.4 cm. Other: None. IMPRESSION: 1. No evidence of significant acute traumatic injury to the skull, brain or cervical spine. 2. Mild cerebral atrophy with extensive chronic microvascular ischemic changes in the cerebral white matter, as above. 3. Mild multilevel  degenerative disc disease and cervical spondylosis. 4. Multiple large nodules in the thyroid gland, similar to prior studies, presumably a multinodular goiter. These have been stable for several years. Electronically Signed   By: Vinnie Langton M.D.   On: 11/25/2016 10:33   Ct Cervical Spine Wo Contrast  Result Date: 11/25/2016 CLINICAL DATA:  83 year old female with large hematoma on the right side of the forehead. Presumed trauma from fall. EXAM: CT HEAD WITHOUT CONTRAST CT CERVICAL SPINE WITHOUT CONTRAST TECHNIQUE: Multidetector CT imaging of the head and cervical spine was performed following the standard protocol without intravenous contrast. Multiplanar CT image reconstructions of the cervical spine were also generated. COMPARISON:  Head and cervical spine CT 07/05/2016. FINDINGS: CT HEAD FINDINGS Brain: Mild cerebral atrophy. Patchy and confluent areas of decreased attenuation are noted throughout the deep and periventricular white matter of the cerebral hemispheres bilaterally, compatible with chronic microvascular ischemic disease. No evidence of acute infarction, hemorrhage, hydrocephalus, extra-axial collection or mass lesion/mass effect. Vascular: No hyperdense vessel or unexpected calcification. Skull: Normal. Negative for fracture or focal lesion. Sinuses/Orbits: No acute finding. Mild multifocal mucosal thickening in the right ethmoid sinuses. Other: Large amount high attenuation soft tissue swelling in the right frontal scalp, compatible with the reported scalp hematoma. CT CERVICAL SPINE FINDINGS Alignment: Normal. Skull base and vertebrae: No acute fracture. No primary bone lesion or focal pathologic process. Soft tissues and spinal canal: No prevertebral fluid or swelling. No visible canal hematoma. Disc levels: Mild multilevel degenerative disc disease, most pronounced at C5-C6 and C6-C7. Mild multilevel facet arthropathy. Upper chest: Multiple large heterogeneous appearing nodules within  the thyroid gland, largest of which is in the left lobe of the gland measuring  up to 3.8 x 2.4 cm. Other: None. IMPRESSION: 1. No evidence of significant acute traumatic injury to the skull, brain or cervical spine. 2. Mild cerebral atrophy with extensive chronic microvascular ischemic changes in the cerebral white matter, as above. 3. Mild multilevel degenerative disc disease and cervical spondylosis. 4. Multiple large nodules in the thyroid gland, similar to prior studies, presumably a multinodular goiter. These have been stable for several years. Electronically Signed   By: Vinnie Langton M.D.   On: 11/25/2016 10:33    EKG:   Orders placed or performed during the hospital encounter of 11/25/16  . ED EKG  . ED EKG  . EKG 12-Lead  . EKG 12-Lead  . EKG 12-Lead  . EKG 12-Lead    ASSESSMENT AND PLAN:   83 year old female with severe Alzheimer's dementia with behavioral disturbances, anxiety, orthostatic hypotension, hypothyroidism presents from assisted living facility secondary to fall and worsening behavior.  #1 Falls- due worsening dementia - PT consulted - patient will likely need long term care, not rehab eligible  #2 Advanced alzheimers dementia- with behavioural disturbances Continue home meds- seroquel, effexor, BuSpar and Ativan.  #3 hypokalemia-being replaced  #4 orthostatic hypertension-chronically on Florinef  #5 DVT Prophylaxis- lovenox   Social worker consulted    All the records are reviewed and case discussed with Care Management/Social Workerr. Management plans discussed with the patient, family and they are in agreement.  CODE STATUS: DO NOT RESUSCITATE  TOTAL TIME TAKING CARE OF THIS PATIENT: 35 minutes.   POSSIBLE D/C IN 1-2 DAYS, DEPENDING ON CLINICAL CONDITION.   Destin Vinsant M.D on 11/26/2016 at 9:56 AM  Between 7am to 6pm - Pager - [phone]  After 6pm go to [email or web address] - password [email or web address] [facility]  Office   [phone]  CC: Primary care physician; No PCP Per Patient

## 2016-11-26 NOTE — Plan of Care (Signed)
Problem: Safety: Goal: Ability to remain free from injury will improve Outcome: Progressing Low bed

## 2016-11-26 NOTE — Clinical Social Work Note (Signed)
Patient transferred from ED to 109, hand off completed for unit social worker, this CSW to sign off.  Please consult unit social worker if any social work needs arise.  Jones Broom. [place], MSW, [place]  11/26/2016 9:07 AM

## 2016-11-26 NOTE — Care Management Obs Status (Addendum)
MEDICARE OBSERVATION STATUS NOTIFICATION   Patient Details  Name: [name] MRN: [id number] Date of Birth: [birth year]   Medicare Observation Status Notification Given:  No  Patient with history of dementia and unable to review form.  Message left for daughter Sandy Salaam, RN 11/26/2016, 3:22 PM

## 2016-11-26 NOTE — NC FL2 (Signed)
[place] LEVEL OF CARE SCREENING TOOL     IDENTIFICATION  Patient Name: [name] Birthdate: 1927-11-07 Sex: female Admission Date (Current Location): 11/25/2016  [place] and [place] Number:  Engineering geologist and Address:  [facility], [address] [place], [place] [address]      Provider Number: [id number]  Attending Physician Name and Address:  Gladstone Lighter, MD  Relative Name and Phone Number:  Rhyne,[name] Daughter [phone] oe    Current Level of Care: Hospital Recommended Level of Care: [place] Prior Approval Number:    Date Approved/Denied:   PASRR Number: [id number]  Discharge Plan: SNF    Current Diagnoses: Patient Active Problem List   Diagnosis Date Noted  . Fall 07/05/2016  . Visual hallucinations 03/28/2013  . Depression 01/12/2013  . Alzheimer's dementia 06/23/2012  . Other constipation 03/22/2011  . ADENOCARCINOMA, COLON 07/15/2010  . ANXIETY STATE, UNSPECIFIED 07/15/2010  . POSTURAL HYPOTENSION 07/15/2010    Orientation RESPIRATION BLADDER Height & Weight     Self  Normal Incontinent Weight: 135 lb 8 oz (61.5 kg) Height:  5\' 5"  (165.1 cm)  BEHAVIORAL SYMPTOMS/MOOD NEUROLOGICAL BOWEL NUTRITION STATUS      Incontinent Diet (Dysphagia 1 diet)  AMBULATORY STATUS COMMUNICATION OF NEEDS Skin   Total Care Verbally Normal                       Personal Care Assistance Level of Assistance  Total care, Bathing, Feeding, Dressing Bathing Assistance: Maximum assistance Feeding assistance: Maximum assistance Dressing Assistance: Maximum assistance Total Care Assistance: Maximum assistance   Functional Limitations Info  Sight, Hearing, Speech Sight Info: Adequate Hearing Info: Adequate Speech Info: Adequate    SPECIAL CARE FACTORS FREQUENCY        PT Frequency: No follow up patient will need 24 hour supervision               Contractures Contractures Info: Not present    Additional Factors Info  Code Status, Allergies, Psychotropic Code Status Info: DNR Allergies Info: ARICEPT DONEPEZIL HCL, CODEINE, ERYTHROMYCIN, PENICILLINS, SULFONAMIDE DERIVATIVES Psychotropic Info: busPIRone (BUSPAR) tablet 10 mg, LORazepam (ATIVAN) tablet 0.5 mg, QUEtiapine (SEROQUEL) tablet 100 mg, and venlafaxine XR (EFFEXOR-XR) 24 hr capsule 150 mg         Current Medications (11/26/2016):  This is the current hospital active medication list Current Facility-Administered Medications  Medication Dose Route Frequency Provider Last Rate Last Dose  . 0.9 %  sodium chloride infusion  250 mL Intravenous PRN Dustin Flock, MD      . acetaminophen (TYLENOL) tablet 650 mg  650 mg Oral Q6H PRN Dustin Flock, MD       Or  . acetaminophen (TYLENOL) suppository 650 mg  650 mg Rectal Q6H PRN Dustin Flock, MD      . busPIRone (BUSPAR) tablet 10 mg  10 mg Oral BID Dustin Flock, MD      . enoxaparin (LOVENOX) injection 40 mg  40 mg Subcutaneous Q24H Dustin Flock, MD      . fludrocortisone (FLORINEF) tablet 0.1 mg  0.1 mg Oral BID Dustin Flock, MD      . LORazepam (ATIVAN) tablet 0.25 mg  0.25 mg Oral Q6H PRN Dustin Flock, MD      . LORazepam (ATIVAN) tablet 0.5 mg  0.5 mg Oral TID Dustin Flock, MD      . Melatonin TABS 5 mg  5 mg Oral QHS Dustin Flock, MD      .  multivitamin with minerals tablet 1 tablet  1 tablet Oral Daily Dustin Flock, MD      . polyethylene glycol (MIRALAX / GLYCOLAX) packet 17 g  17 g Oral Daily Dustin Flock, MD      . QUEtiapine (SEROQUEL) tablet 100 mg  100 mg Oral BID Dustin Flock, MD      . sodium chloride flush (NS) 0.9 % injection 3 mL  3 mL Intravenous Q12H Dustin Flock, MD      . sodium chloride flush (NS) 0.9 % injection 3 mL  3 mL Intravenous PRN Dustin Flock, MD      . venlafaxine XR (EFFEXOR-XR) 24 hr capsule 150 mg  150 mg Oral Q breakfast Dustin Flock, MD         Discharge  Medications: Please see discharge summary for a list of discharge medications.  Relevant Imaging Results:  Relevant Lab Results:   Additional Information SSN: [id number] Please note: patient will be private pay and is also receiving services from [facility].  Jandel Patriarca, Jones Broom, LCSWA

## 2016-11-26 NOTE — Care Management (Signed)
RNCM consult placed due to patient being from [facility].  CSW is aware of admisison.  PT has assessed patient and not recommending any follow up.  Please re consult RNCM if needed.

## 2016-11-26 NOTE — Evaluation (Signed)
Physical Therapy Re-Evaluation Patient Details Name: [name] MRN: [id number] DOB: 11/16/1927 Today's Date: 11/26/2016   History of Present Illness  Pt is an 82 y.o. female presenting to hospital after being found by hospice nurse in bed (at [facility]) with large hematoma to R forehead.  PMH includes alzheimer's dementia, recurrent syncope, orthostatic hypotension, recurrent falls, and recurrent UTI's. Pt was evaluated 11/25/16 in the ED and discharged in house due to a lack of needs/ability to participate. Re-consult was ordered now that pt is admitted under observation status. Per medical record patient's facility is refusing to accept pt at discharge. Pt is AOx1 at time of re-evaluation and unable to contribute to the history. History obtained from medical record. No family present at time of re-evaluation  Clinical Impression  PT re-evaluation requested. She was evaluated yesterday and discharged due to lack of needs/inability to participate. No family is present today so all history is borrowed from yesterday's evaluation. Pt is AOx1 at time of re-evaluation and only follows approximately 20% of simple commands. She requires modA+1 for bed mobility. She refuses to stand and becomes increasingly agitated. Pt eventually stops following commands and repeatedly attempts to return to supine. She is not appropriate for SNF placement for PT needs at this time. Pt will need LTC placement at discharge in a facility equipped with dementia care beds. PT order will be completed. Please enter new order if status or needs change.     Follow Up Recommendations No PT follow up;Supervision/Assistance - 24 hour    Equipment Recommendations  None recommended by PT    Recommendations for Other Services       Precautions / Restrictions Precautions Precautions: Fall Restrictions Weight Bearing Restrictions: No      Mobility  Bed Mobility Overal bed mobility: Needs Assistance Bed  Mobility: Supine to Sit;Sit to Supine     Supine to sit: Mod assist Sit to supine: Mod assist   General bed mobility comments: Assist for trunk and B LE's with heavy cues for sequencing  Transfers                 General transfer comment: Attempted but pt refuses to stand and becomes agitated. Resists therapist and attempts to lay back down repeatedly  Ambulation/Gait                Stairs            Wheelchair Mobility    Modified Rankin (Stroke Patients Only)       Balance Overall balance assessment: Needs assistance Sitting-balance support: Bilateral upper extremity supported Sitting balance-Leahy Scale: Fair Sitting balance - Comments: Pt continually attempting to lay back down in bed                                     Pertinent Vitals/Pain Pain Assessment: Faces Faces Pain Scale: No hurt Pain Intervention(s): Monitored during session    Home Living Family/patient expects to be discharged to:: Unsure                 Additional Comments: Living in a Memory Care Unit at [facility]    Prior Function Level of Independence: Needs assistance   Gait / Transfers Assistance Needed: Per prior PT evaluation: Per pt's daughter and niece, last couple months pt has required max assist for bed mobility and transfers to chair; pt had been able to  walk a couple steps 1-2 months ago w/c to toilet with assist but not recently.  Pt had been able to self propel with UE's and LE's in w/c but not recently (although pt does try to get up on her own).  ADL's / Homemaking Assistance Needed: Needs assist for all ADLs  Comments: Pt sits in high back reclining w/c with wedge cushion.     Hand Dominance        Extremity/Trunk Assessment   Upper Extremity Assessment Upper Extremity Assessment: Difficult to assess due to impaired cognition    Lower Extremity Assessment Lower Extremity Assessment: Difficult to assess due to impaired  cognition       Communication   Communication: No difficulties  Cognition Arousal/Alertness: Awake/alert Behavior During Therapy: Flat affect;Restless Overall Cognitive Status: History of cognitive impairments - at baseline                                 General Comments: Pt is AOx1 at time of evaluation. Unable to provide DOB, place, time, or situation. Pt only follows approximately 20% of simple commands      General Comments General comments (skin integrity, edema, etc.): Large hematoma on R forehead     Exercises     Assessment/Plan    PT Assessment Patent does not need any further PT services  PT Problem List         PT Treatment Interventions      PT Goals (Current goals can be found in the Care Plan section)  Acute Rehab PT Goals PT Goal Formulation: Patient unable to participate in goal setting    Frequency     Barriers to discharge        Co-evaluation               End of Session   Activity Tolerance: Treatment limited secondary to agitation Patient left: in bed;with call bell/phone within reach;with nursing/sitter in room;with bed alarm set Nurse Communication: Mobility status PT Visit Diagnosis: Repeated falls (R29.6);Difficulty in walking, not elsewhere classified (R26.2)    Time: 4158-3094 PT Time Calculation (min) (ACUTE ONLY): 12 min   Charges:   PT Evaluation $PT Re-evaluation: 1 Procedure     PT G Codes:   PT G-Codes **NOT FOR INPATIENT CLASS** Functional Assessment Tool Used: AM-PAC 6 Clicks Basic Mobility Functional Limitation: Mobility: Walking and moving around Mobility: Walking and Moving Around Current Status (M7680): 100 percent impaired, limited or restricted Mobility: Walking and Moving Around Goal Status (S8110): 100 percent impaired, limited or restricted Mobility: Walking and Moving Around Discharge Status (R1594): 100 percent impaired, limited or restricted   Phillips Grout PT, DPT    [place],Jason 11/26/2016, 10:29 AM

## 2016-11-26 NOTE — Clinical Social Work Note (Signed)
Clinical Social Work Assessment  Patient Details  Name: [name] MRN: [id number] Date of Birth: 05-20-1928  Date of referral:  11/25/16               Reason for consult:  Facility Placement                Permission sought to share information with:  Facility Sport and exercise psychologist, Family Supports Permission granted to share information::  Yes, Verbal Permission Granted  Name::     Rhyne,[name] Daughter [phone] or Harmon,Bonnie Niece [phone]  [phone]   Agency::  SNF admissions  Relationship::     Contact Information:     Housing/Transportation Living arrangements for the past 2 months:  [place] of Information:  Adult Children Patient Interpreter Needed:  None Criminal Activity/Legal Involvement Pertinent to Current Situation/Hospitalization:  No - Comment as needed Significant Relationships:  Adult Children, Community Support Lives with:  Facility Resident Do you feel safe going back to the place where you live?  No Need for family participation in patient care:  Yes (Comment)  Care giving concerns:  Patient's daughter expressed concern that she did not feel patient was receiving the appropriate amount of care even with paid sitters and [facility] services.   Social Worker assessment / plan:  Patient is a 83 year old female who has Alzheimer's dementia and has been living at [facility].  CSW spoke to [facility] who feels patient needs higher level of care due to progressing dementia, and recent falls.  Patient is alert and oriented x1 and not able to express her feelings due to dementia. Assessment completed by speaking to patient's daughter Tilda Franco [phone] who was at bedside due to patient's dementia.  Patient's family feel she needs more care and she would be appropriate for a SNF as a long term care resident.  Patient is also being followed by [facility], which patient's  family expressed that they are satisfied with services that are being provided for patient.  CSW explained to patient's daughter that since PT is not recommending 24 hours of care and supervision she will have to pay for SNF privately, and then eventually apply for long term care Medicaid.  Patient's daughter expressed understanding of having to pay privately until assets have been depleted and patient can apply for long term care Medicaid.  Patient's daughter stated she would like to go to [facility] if possible but if not is open to other options.  Patient's family expressed understanding and did not have any other questions.  CSW was given permission to begin bed search process in [place] and [place].   Employment status:  Retired  Forensic scientist:  Other (Comment Required) ([place], patient's family will private pay for SNF.) PT Recommendations:  No PT follow up patient needs 24 hour supervision Information / Referral to community resources:  [place]  Patient/Family's Response to care:  Patient's family would like patient to go to a SNF as a long term care resident.  Patient/Family's Understanding of and Emotional Response to Diagnosis, Current Treatment, and Prognosis:  Patient not aware of current treatment plan or prognosis due to dementia.  Patient's family feels she is not receiving the appropriate amount of care that she needs and would like to have patient placed at a SNF as a long term care resident.  Emotional Assessment Appearance:  Appears stated age Attitude/Demeanor/Rapport:    Affect (typically observed):  Appropriate, Calm Orientation:  Oriented to Self Alcohol / Substance use:  Not Applicable Psych involvement (Current and /or in the community):  No (Comment)  Discharge Needs  Concerns to be addressed:  Lack of Support, Cognitive Concerns Readmission within the last 30 days:  No Current discharge risk:  Lack of support  system Barriers to Discharge:  Continued Medical Work up, Programmer, applications ([place]), Requiring sitter/restraints   Anell Barr 11/25/16 4:45pm

## 2016-11-27 DIAGNOSIS — F0281 Dementia in other diseases classified elsewhere with behavioral disturbance: Secondary | ICD-10-CM | POA: Diagnosis present

## 2016-11-27 DIAGNOSIS — R29810 Facial weakness: Secondary | ICD-10-CM | POA: Diagnosis present

## 2016-11-27 DIAGNOSIS — Z885 Allergy status to narcotic agent status: Secondary | ICD-10-CM | POA: Diagnosis not present

## 2016-11-27 DIAGNOSIS — Z79899 Other long term (current) drug therapy: Secondary | ICD-10-CM | POA: Diagnosis not present

## 2016-11-27 DIAGNOSIS — R296 Repeated falls: Secondary | ICD-10-CM | POA: Diagnosis present

## 2016-11-27 DIAGNOSIS — E876 Hypokalemia: Secondary | ICD-10-CM | POA: Diagnosis present

## 2016-11-27 DIAGNOSIS — Z9049 Acquired absence of other specified parts of digestive tract: Secondary | ICD-10-CM | POA: Diagnosis not present

## 2016-11-27 DIAGNOSIS — R197 Diarrhea, unspecified: Secondary | ICD-10-CM | POA: Diagnosis not present

## 2016-11-27 DIAGNOSIS — Z7952 Long term (current) use of systemic steroids: Secondary | ICD-10-CM | POA: Diagnosis not present

## 2016-11-27 DIAGNOSIS — I951 Orthostatic hypotension: Secondary | ICD-10-CM | POA: Diagnosis present

## 2016-11-27 DIAGNOSIS — Z888 Allergy status to other drugs, medicaments and biological substances status: Secondary | ICD-10-CM | POA: Diagnosis not present

## 2016-11-27 DIAGNOSIS — Z87440 Personal history of urinary (tract) infections: Secondary | ICD-10-CM | POA: Diagnosis not present

## 2016-11-27 DIAGNOSIS — G309 Alzheimer's disease, unspecified: Secondary | ICD-10-CM | POA: Diagnosis present

## 2016-11-27 DIAGNOSIS — Z880 Allergy status to penicillin: Secondary | ICD-10-CM | POA: Diagnosis not present

## 2016-11-27 DIAGNOSIS — X58XXXA Exposure to other specified factors, initial encounter: Secondary | ICD-10-CM | POA: Diagnosis present

## 2016-11-27 DIAGNOSIS — Z90710 Acquired absence of both cervix and uterus: Secondary | ICD-10-CM | POA: Diagnosis not present

## 2016-11-27 DIAGNOSIS — E039 Hypothyroidism, unspecified: Secondary | ICD-10-CM | POA: Diagnosis present

## 2016-11-27 DIAGNOSIS — S0083XA Contusion of other part of head, initial encounter: Secondary | ICD-10-CM | POA: Diagnosis present

## 2016-11-27 DIAGNOSIS — Z881 Allergy status to other antibiotic agents status: Secondary | ICD-10-CM | POA: Diagnosis not present

## 2016-11-27 DIAGNOSIS — Z882 Allergy status to sulfonamides status: Secondary | ICD-10-CM | POA: Diagnosis not present

## 2016-11-27 DIAGNOSIS — F419 Anxiety disorder, unspecified: Secondary | ICD-10-CM | POA: Diagnosis present

## 2016-11-27 DIAGNOSIS — Z85038 Personal history of other malignant neoplasm of large intestine: Secondary | ICD-10-CM | POA: Diagnosis not present

## 2016-11-27 DIAGNOSIS — Z66 Do not resuscitate: Secondary | ICD-10-CM | POA: Diagnosis present

## 2016-11-27 DIAGNOSIS — Z9181 History of falling: Secondary | ICD-10-CM | POA: Diagnosis not present

## 2016-11-27 DIAGNOSIS — Y92099 Unspecified place in other non-institutional residence as the place of occurrence of the external cause: Secondary | ICD-10-CM | POA: Diagnosis not present

## 2016-11-27 LAB — BASIC METABOLIC PANEL
ANION GAP: 5 (ref 5–15)
BUN: 20 mg/dL (ref 6–20)
CALCIUM: 8.9 mg/dL (ref 8.9–10.3)
CO2: 31 mmol/L (ref 22–32)
Chloride: 104 mmol/L (ref 101–111)
Creatinine, Ser: 0.94 mg/dL (ref 0.44–1.00)
GFR, EST NON AFRICAN AMERICAN: 53 mL/min — AB (ref >60)
GLUCOSE: 93 mg/dL (ref 65–99)
POTASSIUM: 4.4 mmol/L (ref 3.5–5.1)
SODIUM: 140 mmol/L (ref 135–145)

## 2016-11-27 NOTE — Progress Notes (Signed)
[place] at [place] NAME: [name]    MR#:  [id number]  DATE OF BIRTH:  [birth year]  SUBJECTIVE:  CHIEF COMPLAINT:   Chief Complaint  Patient presents with  . Fall   -Admitted from assisted living facility due to falls and worsening behavior from dementia -sleeping at this time.  REVIEW OF SYSTEMS:  Review of Systems  Unable to perform ROS: Dementia    DRUG ALLERGIES:   Allergies  Allergen Reactions  . Aricept [Donepezil Hcl]     Increased hallucinations   . Codeine   . Erythromycin     REACTION: GI upset  . Penicillins Other (See Comments)    Has patient had a PCN reaction causing immediate rash, facial/tongue/throat swelling, SOB or lightheadedness with hypotension: UNK  Has patient had a PCN reaction causing severe rash involving mucus membranes or skin necrosis: UNK Has patient had a PCN reaction that required hospitalization: UNK Has patient had a PCN reaction occurring within the last 10 years: UNK If all of the above answers are "NO", then may proceed with Cephalosporin use. ** ALLERGY LISTED ON MAR, UNKNOWN REACTION**  . Sulfonamide Derivatives     REACTION: GI upset    VITALS:  Blood pressure (!) 144/66, pulse 68, temperature 98.2 F (36.8 C), resp. rate 18, height 5\' 5"  (1.651 m), weight 61.5 kg (135 lb 8 oz), SpO2 96 %.  PHYSICAL EXAMINATION:  Physical Exam  GENERAL:  83 y.o.-year-old patient lying in the bed with no acute distress. Sleeping and confused when awakened EYES: Pupils equal, round, reactive to light and accommodation. No scleral icterus. Extraocular muscles intact.  HEENT: Head atraumatic, normocephalic. Oropharynx and nasopharynx clear. There is a contusion on the right side of forehead NECK:  Supple, no jugular venous distention. No thyroid enlargement, no tenderness.  LUNGS: Normal breath sounds bilaterally, no wheezing, rales,rhonchi or crepitation. No use of accessory muscles of  respiration.  CARDIOVASCULAR: S1, S2 normal. No  rubs, or gallops. 2/6 systolic murmur is present ABDOMEN: Soft, nontender, nondistended. Bowel sounds present. No organomegaly or mass.  EXTREMITIES: No pedal edema, cyanosis, or clubbing.  NEUROLOGIC: Cranial nerves II through XII are intact. Moving all extremities in bed. Sensation intact. Gait not checked.  PSYCHIATRIC: The patient is sleepy, alert to self when aroused. Not oriented. Following commands. SKIN: No obvious rash, lesion, or ulcer.    LABORATORY PANEL:   CBC  Recent Labs Lab 11/26/16 0402  WBC 9.2  HGB 11.8*  HCT 35.2  PLT 229   ------------------------------------------------------------------------------------------------------------------  Chemistries   Recent Labs Lab 11/25/16 0953 11/26/16 0402 11/27/16 0421  NA 138 134* 140  K 3.2* 2.9* 4.4  CL 103 98* 104  CO2 32 29 31  GLUCOSE 127* 88 93  BUN 22* 11 20  CREATININE 0.84 0.64 0.94  CALCIUM 8.5* 8.6* 8.9  MG  --  1.6*  --   AST 16  --   --   ALT 10*  --   --   ALKPHOS 72  --   --   BILITOT 0.6  --   --    ------------------------------------------------------------------------------------------------------------------  Cardiac Enzymes  Recent Labs Lab 11/25/16 0953  TROPONINI <0.03   ------------------------------------------------------------------------------------------------------------------  RADIOLOGY:  No results found.  EKG:   Orders placed or performed during the hospital encounter of 11/25/16  . ED EKG  . ED EKG  . EKG 12-Lead  . EKG 12-Lead  . EKG 12-Lead  . EKG 12-Lead  ASSESSMENT AND PLAN:   83 year old female with severe Alzheimer's dementia with behavioral disturbances, anxiety, orthostatic hypotension, hypothyroidism presents from assisted living facility secondary to fall and worsening behavior.  #1 Falls- due worsening dementia - PT consulted - patient will likely need long term care, not rehab  eligible  #2 Advanced alzheimers dementia- with behavioural disturbances Continue home meds- seroquel, effexor, BuSpar and Ativan.  #3 hypokalemia-replaced  #4 orthostatic hypertension-chronically on Florinef  #5 DVT Prophylaxis- lovenox   Social worker consulted for placement    All the records are reviewed and case discussed with Care Management/Social Workerr. Management plans discussed with the patient, family and they are in agreement.  CODE STATUS: DO NOT RESUSCITATE  TOTAL TIME TAKING CARE OF THIS PATIENT: 28 minutes.   POSSIBLE D/C IN 1-2 DAYS, DEPENDING ON CLINICAL CONDITION.   Gladstone Lighter M.D on 11/27/2016 at 10:51 AM  Between 7am to 6pm - Pager - [phone]  After 6pm go to [email or web address] - password [email or web address] [facility]  Office  [phone]  CC: Primary care physician; No PCP Per Patient

## 2016-11-27 NOTE — Clinical Social Work Note (Signed)
CSW visited the patient and her daughter at bedside to discuss bed offers. The patient's daughter indicated that [place] is a definite "no." She plans to visit [facility] and is considering that location. The patient's daughter also has interest in [facility] as they have not declined the patient at this time. The patient would also like to speak with financial counseling if possible to assess the ability to apply for Medicaid for LTC. CSW will con't to follow.  Santiago Bumpers, MSW, Latanya Presser [phone]

## 2016-11-27 NOTE — Progress Notes (Signed)
Pt has been calm/quiet/ lies with eyes closed even when feed. Pt eats readily/swallows from up held to mouth in sitting up position. Dgt in and met with SW. Continued low safety bed.

## 2016-11-28 NOTE — Progress Notes (Signed)
[place] at [place] NAME: [name]    MR#:  [id number]  DATE OF BIRTH:  [birth year]  SUBJECTIVE:  CHIEF COMPLAINT:   Chief Complaint  Patient presents with  . Fall   -Admitted from assisted living facility due to falls and worsening behavior from dementia -non verbal, keeps eyes closed - diarrhea this AM  REVIEW OF SYSTEMS:  Review of Systems  Unable to perform ROS: Dementia    DRUG ALLERGIES:   Allergies  Allergen Reactions  . Aricept [Donepezil Hcl]     Increased hallucinations   . Codeine   . Erythromycin     REACTION: GI upset  . Penicillins Other (See Comments)    Has patient had a PCN reaction causing immediate rash, facial/tongue/throat swelling, SOB or lightheadedness with hypotension: UNK  Has patient had a PCN reaction causing severe rash involving mucus membranes or skin necrosis: UNK Has patient had a PCN reaction that required hospitalization: UNK Has patient had a PCN reaction occurring within the last 10 years: UNK If all of the above answers are "NO", then may proceed with Cephalosporin use. ** ALLERGY LISTED ON MAR, UNKNOWN REACTION**  . Sulfonamide Derivatives     REACTION: GI upset    VITALS:  Blood pressure (!) 125/55, pulse 71, temperature 98.3 F (36.8 C), temperature source Oral, resp. rate 18, height 5\' 5"  (1.651 m), weight 61.5 kg (135 lb 8 oz), SpO2 93 %.  PHYSICAL EXAMINATION:  Physical Exam  GENERAL:  83 y.o.-year-old patient lying in the bed with no acute distress. Sleeping and confused when awakened EYES: Pupils equal, round, reactive to light and accommodation. No scleral icterus. Extraocular muscles intact.  HEENT: Head atraumatic, normocephalic. Oropharynx and nasopharynx clear. There is a contusion on the right side of forehead NECK:  Supple, no jugular venous distention. No thyroid enlargement, no tenderness.  LUNGS: Normal breath sounds bilaterally, no wheezing, rales,rhonchi or  crepitation. No use of accessory muscles of respiration.  CARDIOVASCULAR: S1, S2 normal. No  rubs, or gallops. 2/6 systolic murmur is present ABDOMEN: Soft, nontender, nondistended. Bowel sounds present. No organomegaly or mass.  EXTREMITIES: No pedal edema, cyanosis, or clubbing.  NEUROLOGIC: Cranial nerves II through XII are intact. Moving all extremities in bed. Sensation intact. Gait not checked. Seems non verbal PSYCHIATRIC: The patient is alert, keeps eyes closed.Not oriented. Following commands. SKIN: No obvious rash, lesion, or ulcer.    LABORATORY PANEL:   CBC  Recent Labs Lab 11/26/16 0402  WBC 9.2  HGB 11.8*  HCT 35.2  PLT 229   ------------------------------------------------------------------------------------------------------------------  Chemistries   Recent Labs Lab 11/25/16 0953 11/26/16 0402 11/27/16 0421  NA 138 134* 140  K 3.2* 2.9* 4.4  CL 103 98* 104  CO2 32 29 31  GLUCOSE 127* 88 93  BUN 22* 11 20  CREATININE 0.84 0.64 0.94  CALCIUM 8.5* 8.6* 8.9  MG  --  1.6*  --   AST 16  --   --   ALT 10*  --   --   ALKPHOS 72  --   --   BILITOT 0.6  --   --    ------------------------------------------------------------------------------------------------------------------  Cardiac Enzymes  Recent Labs Lab 11/25/16 0953  TROPONINI <0.03   ------------------------------------------------------------------------------------------------------------------  RADIOLOGY:  No results found.  EKG:   Orders placed or performed during the hospital encounter of 11/25/16  . ED EKG  . ED EKG  . EKG 12-Lead  . EKG 12-Lead  .  EKG 12-Lead  . EKG 12-Lead    ASSESSMENT AND PLAN:   83 year old female with severe Alzheimer's dementia with behavioral disturbances, anxiety, orthostatic hypotension, hypothyroidism presents from assisted living facility secondary to fall and worsening behavior.  #1 Falls- due worsening dementia - PT consulted - patient will  likely need long term care, not rehab eligible  #2 Advanced alzheimers dementia- with behavioural disturbances Continue home meds- seroquel, effexor, BuSpar and Ativan.  #3 diarrhea- discontinue miralax. If still continues to have it- order stool studies  #4 orthostatic hypertension-chronically on Florinef  #5 DVT Prophylaxis- lovenox   Social worker consulted for placement    All the records are reviewed and case discussed with Care Management/Social Workerr. Management plans discussed with the patient, family and they are in agreement.  CODE STATUS: DO NOT RESUSCITATE  TOTAL TIME TAKING CARE OF THIS PATIENT: 28 minutes.   POSSIBLE D/C TOMORROW, DEPENDING ON CLINICAL CONDITION.   Gladstone Lighter M.D on 11/28/2016 at 7:39 AM  Between 7am to 6pm - Pager - [phone]  After 6pm go to [email or web address] - password [email or web address] [facility]  Office  [phone]  CC: Primary care physician; No PCP Per Patient

## 2016-11-28 NOTE — Plan of Care (Signed)
Problem: Activity: Goal: Risk for activity intolerance will decrease Outcome: Not Progressing Pt is too lethargic and is unable to transfer out of bed.

## 2016-11-28 NOTE — Progress Notes (Signed)
Pt lethargic most of the day today. Night shift RN reported that patient had multiple BM's last night and therefore did not sleep well. Pt slept for most of the day however did wake up to eat dinner. She is now alert but continues to be disoriented X3. Ammie Dalton, RN

## 2016-11-28 NOTE — Plan of Care (Signed)
Problem: Safety: Goal: Ability to remain free from injury will improve Outcome: Progressing Continues with low bed

## 2016-11-29 LAB — BASIC METABOLIC PANEL
Anion gap: 5 (ref 5–15)
BUN: 26 mg/dL — ABNORMAL HIGH (ref 6–20)
CHLORIDE: 104 mmol/L (ref 101–111)
CO2: 31 mmol/L (ref 22–32)
CREATININE: 0.63 mg/dL (ref 0.44–1.00)
Calcium: 8.6 mg/dL — ABNORMAL LOW (ref 8.9–10.3)
GFR calc non Af Amer: >60 mL/min (ref >60)
Glucose, Bld: 109 mg/dL — ABNORMAL HIGH (ref 65–99)
Potassium: 3.9 mmol/L (ref 3.5–5.1)
Sodium: 140 mmol/L (ref 135–145)

## 2016-11-29 MED ORDER — LORAZEPAM 0.5 MG PO TABS: 0.5000 mg | ORAL_TABLET | Freq: Three times a day (TID) | ORAL | 0 refills | Status: AC

## 2016-11-29 MED ORDER — ORAL CARE MOUTH RINSE
15.0000 mL | Freq: Two times a day (BID) | OROMUCOSAL | Status: DC
  Administered 2016-11-29 – 2016-11-30 (×2): 15 mL via OROMUCOSAL

## 2016-11-29 MED ORDER — LORAZEPAM 0.5 MG PO TABS: 0.2500 mg | ORAL_TABLET | Freq: Four times a day (QID) | ORAL | 0 refills | Status: AC | PRN

## 2016-11-29 MED ORDER — POLYETHYLENE GLYCOL 3350 17 G PO PACK: 17.0000 g | PACK | Freq: Every day | ORAL | 0 refills | Status: AC | PRN

## 2016-11-29 NOTE — Discharge Summary (Addendum)
[place] [facility] [place] NAME: [name]    MR#:  [id number]  DATE OF BIRTH:  05/06/1928  DATE OF ADMISSION:  11/25/2016   ADMITTING PHYSICIAN: Dustin Flock, MD  DATE OF DISCHARGE: 11/29/2016  PRIMARY CARE PHYSICIAN: No PCP Per Patient   ADMISSION DIAGNOSIS:   Hypokalemia [E87.6] Contusion of forehead, initial encounter [S00.83XA]  DISCHARGE DIAGNOSIS:   Active Problems:   Fall   Alzheimer's dementia with behavioral disturbance   SECONDARY DIAGNOSIS:   Past Medical History:  Diagnosis Date  . Alzheimer's dementia   . Anxiety    H/o anxiety, would use as needed diazepam with relief  . Colon cancer [facility])    h/o colon CA 2010- colectomy 2010  (Dr. Lamonte Sakai with oncology)  . Falls   . Orthostasis    h/o orthostasis, didn't tolerate midodrine, prev eval by Dr. Marlou Porch  . Orthostatic hypotension   . PONV (postoperative nausea and vomiting)   . Thyroid disease    H/o thyroid nodule with benign bx 2010  . UTI (urinary tract infection)     HOSPITAL COURSE:   83 year old female with severe Alzheimer's dementia with behavioral disturbances, anxiety, orthostatic hypotension, hypothyroidism presents from assisted living facility secondary to fall and worsening behavior.  #1 Falls- due worsening dementia - PT consulted - patient will likely need long term care, not rehab eligible  #2 Advanced alzheimers dementia- with behavioural disturbances Continue home meds- seroquel, effexor, BuSpar and Ativan.  #3 diarrhea- improved after MiraLAX was discontinued- changed to PRN  #4 orthostatic hypertension-chronically on Florinef   DISCHARGE CONDITIONS:   Guarded  CONSULTS OBTAINED:   None  DRUG ALLERGIES:   Allergies  Allergen Reactions  . Aricept [Donepezil Hcl]     Increased hallucinations   . Codeine   . Erythromycin     REACTION: GI upset  . Penicillins Other (See Comments)    Has patient had a PCN reaction  causing immediate rash, facial/tongue/throat swelling, SOB or lightheadedness with hypotension: UNK  Has patient had a PCN reaction causing severe rash involving mucus membranes or skin necrosis: UNK Has patient had a PCN reaction that required hospitalization: UNK Has patient had a PCN reaction occurring within the last 10 years: UNK If all of the above answers are "NO", then may proceed with Cephalosporin use. ** ALLERGY LISTED ON MAR, UNKNOWN REACTION**  . Sulfonamide Derivatives     REACTION: GI upset   DISCHARGE MEDICATIONS:   Allergies as of 11/29/2016      Reactions   Aricept [donepezil Hcl]    Increased hallucinations    Codeine    Erythromycin    REACTION: GI upset   Penicillins Other (See Comments)   Has patient had a PCN reaction causing immediate rash, facial/tongue/throat swelling, SOB or lightheadedness with hypotension: UNK  Has patient had a PCN reaction causing severe rash involving mucus membranes or skin necrosis: UNK Has patient had a PCN reaction that required hospitalization: UNK Has patient had a PCN reaction occurring within the last 10 years: UNK If all of the above answers are "NO", then may proceed with Cephalosporin use. ** ALLERGY LISTED ON MAR, UNKNOWN REACTION**   Sulfonamide Derivatives    REACTION: GI upset      Medication List    TAKE these medications   busPIRone 10 MG tablet Commonly known as:  BUSPAR Take 10 mg by mouth 2 (two) times daily.   fludrocortisone 0.1 MG tablet Commonly known as:  FLORINEF  Take 0.1 mg by mouth 2 (two) times daily.   LORazepam 0.5 MG tablet Commonly known as:  ATIVAN Take 1 tablet (0.5 mg total) by mouth 3 (three) times daily.   LORazepam 0.5 MG tablet Commonly known as:  ATIVAN Take 0.5 tablets (0.25 mg total) by mouth every 6 (six) hours as needed for anxiety.   Melatonin 3 MG Tabs Take 3 mg by mouth at bedtime.   multivitamin with minerals Tabs tablet Take 1 tablet by mouth daily.   polyethylene  glycol packet Commonly known as:  MIRALAX / GLYCOLAX Take 17 g by mouth daily as needed for mild constipation. What changed:  when to take this  reasons to take this   QUEtiapine 100 MG tablet Commonly known as:  SEROQUEL Take 100 mg by mouth 2 (two) times daily.   venlafaxine XR 150 MG 24 hr capsule Commonly known as:  EFFEXOR-XR Take 150 mg by mouth daily with breakfast.        DISCHARGE INSTRUCTIONS:   1. PCP f/u in 1-2 week 2. PALLIATIVE CARE FOLLOW UP   DIET:   Regular diet  ACTIVITY:   Activity as tolerated  OXYGEN:   Home Oxygen: No.  Oxygen Delivery: room air  DISCHARGE LOCATION:   nursing home   If you experience worsening of your admission symptoms, develop shortness of breath, life threatening emergency, suicidal or homicidal thoughts you must seek medical attention immediately by calling 911 or calling your MD immediately  if symptoms less severe.  You Must read complete instructions/literature along with all the possible adverse reactions/side effects for all the Medicines you take and that have been prescribed to you. Take any new Medicines after you have completely understood and accpet all the possible adverse reactions/side effects.   Please note  You were cared for by a hospitalist during your hospital stay. If you have any questions about your discharge medications or the care you received while you were in the hospital after you are discharged, you can call the unit and asked to speak with the hospitalist on call if the hospitalist that took care of you is not available. Once you are discharged, your primary care physician will handle any further medical issues. Please note that NO REFILLS for any discharge medications will be authorized once you are discharged, as it is imperative that you return to your primary care physician (or establish a relationship with a primary care physician if you do not have one) for your aftercare needs so that they  can reassess your need for medications and monitor your lab values.    On the day of Discharge:  VITAL SIGNS:   Blood pressure (!) 142/59, pulse 64, temperature 97.5 F (36.4 C), resp. rate 18, height 5\' 5"  (1.651 m), weight 61.5 kg (135 lb 8 oz), SpO2 95 %.  PHYSICAL EXAMINATION:    GENERAL:  83 y.o.-year-old patient lying in the bed with no acute distress. Sleeping and confused when awakened EYES: Pupils equal, round, reactive to light and accommodation. No scleral icterus. Extraocular muscles intact.  HEENT: Head atraumatic, normocephalic. Oropharynx and nasopharynx clear. There is a contusion on the right side of forehead NECK:  Supple, no jugular venous distention. No thyroid enlargement, no tenderness.  LUNGS: Normal breath sounds bilaterally, no wheezing, rales,rhonchi or crepitation. No use of accessory muscles of respiration.  CARDIOVASCULAR: S1, S2 normal. No  rubs, or gallops. 2/6 systolic murmur is present ABDOMEN: Soft, nontender, nondistended. Bowel sounds present. No organomegaly or mass.  EXTREMITIES: No pedal edema, cyanosis, or clubbing.  NEUROLOGIC: Cranial nerves II through XII are intact. Moving all extremities in bed. Sensation intact. Gait not checked. Seems non verbal PSYCHIATRIC: The patient is alert, keeps eyes closed.Not oriented. Following commands. SKIN: No obvious rash, lesion, or ulcer. Marland Kitchen   DATA REVIEW:   CBC  Recent Labs Lab 11/26/16 0402  WBC 9.2  HGB 11.8*  HCT 35.2  PLT 229    Chemistries   Recent Labs Lab 11/25/16 0953 11/26/16 0402  11/29/16 0443  NA 138 134*  < > 140  K 3.2* 2.9*  < > 3.9  CL 103 98*  < > 104  CO2 32 29  < > 31  GLUCOSE 127* 88  < > 109*  BUN 22* 11  < > 26*  CREATININE 0.84 0.64  < > 0.63  CALCIUM 8.5* 8.6*  < > 8.6*  MG  --  1.6*  --   --   AST 16  --   --   --   ALT 10*  --   --   --   ALKPHOS 72  --   --   --   BILITOT 0.6  --   --   --   < > = values in this interval not displayed.   Microbiology  Results  Results for orders placed or performed during the hospital encounter of 11/25/16  MRSA PCR Screening     Status: None   Collection Time: 11/25/16 10:00 PM  Result Value Ref Range Status   MRSA by PCR NEGATIVE NEGATIVE Final    Comment:        The GeneXpert MRSA Assay (FDA approved for NASAL specimens only), is one component of a comprehensive MRSA colonization surveillance program. It is not intended to diagnose MRSA infection nor to guide or monitor treatment for MRSA infections.     RADIOLOGY:  No results found.   Management plans discussed with the patient, family and they are in agreement.  CODE STATUS:     Code Status Orders        Start     Ordered   11/25/16 1937  Do not attempt resuscitation (DNR)  Continuous    Question Answer Comment  In the event of cardiac or respiratory ARREST Do not call a "code blue"   In the event of cardiac or respiratory ARREST Do not perform Intubation, CPR, defibrillation or ACLS   In the event of cardiac or respiratory ARREST Use medication by any route, position, wound care, and other measures to relive pain and suffering. May use oxygen, suction and manual treatment of airway obstruction as needed for comfort.      11/25/16 1937    Code Status History    Date Active Date Inactive Code Status Order ID Comments User Context   11/25/2016  5:13 PM 11/25/2016  7:37 PM DNR [id number]  Dustin Flock, MD ED   07/05/2016  9:48 PM 07/06/2016  7:41 PM DNR [id number]  Dustin Flock, MD Inpatient   08/03/2013  3:40 PM 08/07/2013  7:05 PM Full Code [id number]  Costin Karlyne Greenspan, MD ED    Advance Directive Documentation     Most Recent Value  Type of Advance Directive  Out of facility DNR (pink MOST or yellow form)  Pre-existing out of facility DNR order (yellow form or pink MOST form)  Physician notified to receive inpatient order  "MOST" Form in Place?  -      [place]  CARE OF THIS PATIENT: 38 minutes.    Gladstone Lighter  M.D on 11/29/2016 at 2:22 PM  Between 7am to 6pm - Pager - [phone]  After 6pm go to [email or web address] - [employer] [place] [facility]  Office  [phone]  CC: Primary care physician; No PCP Per Patient   Note: This dictation was prepared with Dragon dictation along with smaller phrase technology. Any transcriptional errors that result from this process are unintentional.

## 2016-11-29 NOTE — Progress Notes (Signed)
[place] at [place] NAME: [name]    MR#:  [id number]  DATE OF BIRTH:  1928-02-06  SUBJECTIVE:  CHIEF COMPLAINT:   Chief Complaint  Patient presents with  . Fall   -Admitted from assisted living facility due to falls and worsening behavior from dementia -non verbal, keeps eyes closed  REVIEW OF SYSTEMS:  Review of Systems  Unable to perform ROS: Dementia    DRUG ALLERGIES:   Allergies  Allergen Reactions  . Aricept [Donepezil Hcl]     Increased hallucinations   . Codeine   . Erythromycin     REACTION: GI upset  . Penicillins Other (See Comments)    Has patient had a PCN reaction causing immediate rash, facial/tongue/throat swelling, SOB or lightheadedness with hypotension: UNK  Has patient had a PCN reaction causing severe rash involving mucus membranes or skin necrosis: UNK Has patient had a PCN reaction that required hospitalization: UNK Has patient had a PCN reaction occurring within the last 10 years: UNK If all of the above answers are "NO", then may proceed with Cephalosporin use. ** ALLERGY LISTED ON MAR, UNKNOWN REACTION**  . Sulfonamide Derivatives     REACTION: GI upset    VITALS:  Blood pressure (!) 142/59, pulse 64, temperature 97.5 F (36.4 C), resp. rate 18, height 5\' 5"  (1.651 m), weight 61.5 kg (135 lb 8 oz), SpO2 95 %.  PHYSICAL EXAMINATION:  Physical Exam  GENERAL:  83 y.o.-year-old patient lying in the bed with no acute distress. Sleeping and confused when awakened EYES: Pupils equal, round, reactive to light and accommodation. No scleral icterus. Extraocular muscles intact.  HEENT: Head atraumatic, normocephalic. Oropharynx and nasopharynx clear. There is a contusion on the right side of forehead NECK:  Supple, no jugular venous distention. No thyroid enlargement, no tenderness.  LUNGS: Normal breath sounds bilaterally, no wheezing, rales,rhonchi or crepitation. No use of accessory muscles of  respiration.  CARDIOVASCULAR: S1, S2 normal. No  rubs, or gallops. 2/6 systolic murmur is present ABDOMEN: Soft, nontender, nondistended. Bowel sounds present. No organomegaly or mass.  EXTREMITIES: No pedal edema, cyanosis, or clubbing.  NEUROLOGIC: Cranial nerves II through XII are intact. Moving all extremities in bed. Sensation intact. Gait not checked. Seems non verbal PSYCHIATRIC: The patient is alert, keeps eyes closed.Not oriented. Following commands. SKIN: No obvious rash, lesion, or ulcer.    LABORATORY PANEL:   CBC  Recent Labs Lab 11/26/16 0402  WBC 9.2  HGB 11.8*  HCT 35.2  PLT 229   ------------------------------------------------------------------------------------------------------------------  Chemistries   Recent Labs Lab 11/25/16 0953 11/26/16 0402  11/29/16 0443  NA 138 134*  < > 140  K 3.2* 2.9*  < > 3.9  CL 103 98*  < > 104  CO2 32 29  < > 31  GLUCOSE 127* 88  < > 109*  BUN 22* 11  < > 26*  CREATININE 0.84 0.64  < > 0.63  CALCIUM 8.5* 8.6*  < > 8.6*  MG  --  1.6*  --   --   AST 16  --   --   --   ALT 10*  --   --   --   ALKPHOS 72  --   --   --   BILITOT 0.6  --   --   --   < > = values in this interval not displayed. ------------------------------------------------------------------------------------------------------------------  Cardiac Enzymes  Recent Labs Lab 11/25/16 0953  TROPONINI <0.03   ------------------------------------------------------------------------------------------------------------------  RADIOLOGY:  No results found.  EKG:   Orders placed or performed during the hospital encounter of 11/25/16  . ED EKG  . ED EKG  . EKG 12-Lead  . EKG 12-Lead  . EKG 12-Lead  . EKG 12-Lead    ASSESSMENT AND PLAN:   83 year old female with severe Alzheimer's dementia with behavioral disturbances, anxiety, orthostatic hypotension, hypothyroidism presents from assisted living facility secondary to fall and worsening  behavior.  #1 Falls- due worsening dementia - PT consulted - patient will likely need long term care, not rehab eligible  #2 Advanced alzheimers dementia- with behavioural disturbances Continue home meds- seroquel, effexor, BuSpar and Ativan.  #3 diarrhea- improved after MiraLAX was discontinued  #4 orthostatic hypertension-chronically on Florinef  #5 DVT Prophylaxis- lovenox   Social worker consulted for placement    All the records are reviewed and case discussed with Care Management/Social Workerr. Management plans discussed with the patient, family and they are in agreement.  CODE STATUS: DO NOT RESUSCITATE  TOTAL TIME TAKING CARE OF THIS PATIENT: 28 minutes.   POSSIBLE D/C TOMORROW, DEPENDING ON CLINICAL CONDITION.   Gladstone Lighter M.D on 11/29/2016 at 12:06 PM  Between 7am to 6pm - Pager - [phone]  After 6pm go to [email or web address] - password [email or web address] [place] [facility]  Office  [phone]  CC: Primary care physician; No PCP Per Patient

## 2016-11-30 NOTE — Discharge Summary (Signed)
[place] at [place] NAME: [name]    MR#:  [id number]  DATE OF BIRTH:  26-Apr-1928  DATE OF ADMISSION:  11/25/2016 ADMITTING PHYSICIAN: Dustin Flock, MD  DATE OF DISCHARGE: 11/30/2016  PRIMARY CARE PHYSICIAN: No PCP Per Patient    ADMISSION DIAGNOSIS:  Hypokalemia [E87.6] Contusion of forehead, initial encounter [S00.83XA]  DISCHARGE DIAGNOSIS:  *Fall *Alzheimer's dementia with behavioral disturbance  SECONDARY DIAGNOSIS:   Past Medical History:  Diagnosis Date  . Alzheimer's dementia   . Anxiety    H/o anxiety, would use as needed diazepam with relief  . Colon cancer [facility])    h/o colon CA 2010- colectomy 2010  (Dr. Lamonte Sakai with oncology)  . Falls   . Orthostasis    h/o orthostasis, didn't tolerate midodrine, prev eval by Dr. Marlou Porch  . Orthostatic hypotension   . PONV (postoperative nausea and vomiting)   . Thyroid disease    H/o thyroid nodule with benign bx 2010  . UTI (urinary tract infection)     HOSPITAL COURSE:   83 year old female with severe Alzheimer's dementia with behavioral disturbances, anxiety, orthostatic hypotension, hypothyroidism presents from assisted living facility secondary to fall and worsening behavior.  #1 Falls- due worsening dementia - PT consulted - patient will likely need long term care, not rehab eligible  #2 Advanced alzheimers dementia- with behavioural disturbances Continue home meds- seroquel, effexor, BuSpar and Ativan.  #3 diarrhea- improved after MiraLAX was discontinued- changed to PRN  #4 orthostatic hypertension-chronically on Florinef  Overall at baseline d/c to  SNF  CONSULTS OBTAINED:    DRUG ALLERGIES:   Allergies  Allergen Reactions  . Aricept [Donepezil Hcl]     Increased hallucinations   . Codeine   . Erythromycin     REACTION: GI upset  . Penicillins Other (See Comments)    Has patient had a PCN reaction causing immediate rash,  facial/tongue/throat swelling, SOB or lightheadedness with hypotension: UNK  Has patient had a PCN reaction causing severe rash involving mucus membranes or skin necrosis: UNK Has patient had a PCN reaction that required hospitalization: UNK Has patient had a PCN reaction occurring within the last 10 years: UNK If all of the above answers are "NO", then may proceed with Cephalosporin use. ** ALLERGY LISTED ON MAR, UNKNOWN REACTION**  . Sulfonamide Derivatives     REACTION: GI upset    DISCHARGE MEDICATIONS:   Current Discharge Medication List    CONTINUE these medications which have CHANGED   Details  !! LORazepam (ATIVAN) 0.5 MG tablet Take 1 tablet (0.5 mg total) by mouth 3 (three) times daily. Qty: 30 tablet, Refills: 0    !! LORazepam (ATIVAN) 0.5 MG tablet Take 0.5 tablets (0.25 mg total) by mouth every 6 (six) hours as needed for anxiety. Qty: 20 tablet, Refills: 0    polyethylene glycol (MIRALAX / GLYCOLAX) packet Take 17 g by mouth daily as needed for mild constipation. Qty: 14 each, Refills: 0     !! - Potential duplicate medications found. Please discuss with provider.    CONTINUE these medications which have NOT CHANGED   Details  busPIRone (BUSPAR) 10 MG tablet Take 10 mg by mouth 2 (two) times daily.    fludrocortisone (FLORINEF) 0.1 MG tablet Take 0.1 mg by mouth 2 (two) times daily.    Melatonin 3 MG TABS Take 3 mg by mouth at bedtime.    Multiple Vitamin (MULTIVITAMIN WITH MINERALS) TABS tablet Take 1 tablet by mouth  daily.    QUEtiapine (SEROQUEL) 100 MG tablet Take 100 mg by mouth 2 (two) times daily.     venlafaxine XR (EFFEXOR-XR) 150 MG 24 hr capsule Take 150 mg by mouth daily with breakfast.        If you experience worsening of your admission symptoms, develop shortness of breath, life threatening emergency, suicidal or homicidal thoughts you must seek medical attention immediately by calling 911 or calling your MD immediately  if symptoms less  severe.  You Must read complete instructions/literature along with all the possible adverse reactions/side effects for all the Medicines you take and that have been prescribed to you. Take any new Medicines after you have completely understood and accept all the possible adverse reactions/side effects.   Please note  You were cared for by a hospitalist during your hospital stay. If you have any questions about your discharge medications or the care you received while you were in the hospital after you are discharged, you can call the unit and asked to speak with the hospitalist on call if the hospitalist that took care of you is not available. Once you are discharged, your primary care physician will handle any further medical issues. Please note that NO REFILLS for any discharge medications will be authorized once you are discharged, as it is imperative that you return to your primary care physician (or establish a relationship with a primary care physician if you do not have one) for your aftercare needs so that they can reassess your need for medications and monitor your lab values. Today   SUBJECTIVE   Resting quietly. No complaints  VITAL SIGNS:  Blood pressure 140/79, pulse 63, temperature 97.6 F (36.4 C), resp. rate 18, height 5\' 5"  (1.651 m), weight 61.5 kg (135 lb 8 oz), SpO2 100 %.  I/O:  No intake or output data in the 24 hours ending 11/30/16 0737  PHYSICAL EXAMINATION:  GENERAL:  83 y.o.-year-old patient lying in the bed with no acute distress.  EYES: Pupils equal, round, reactive to light and accommodation. No scleral icterus. Extraocular muscles intact.  HEENT: Head atraumatic, normocephalic. Oropharynx and nasopharynx clear.  NECK:  Supple, no jugular venous distention. No thyroid enlargement, no tenderness.  LUNGS: Normal breath sounds bilaterally, no wheezing, rales,rhonchi or crepitation. No use of accessory muscles of respiration.  CARDIOVASCULAR: S1, S2 normal. No  murmurs, rubs, or gallops.  ABDOMEN: Soft, non-tender, non-distended. Bowel sounds present. No organomegaly or mass.  EXTREMITIES: No pedal edema, cyanosis, or clubbing.  NEUROLOGIC:unable to assess due to dementia but non focal grossly PSYCHIATRIC:  Pt has pleasant confusion SKIN: No obvious rash, lesion, or ulcer.   DATA REVIEW:   CBC   Recent Labs Lab 11/26/16 0402  WBC 9.2  HGB 11.8*  HCT 35.2  PLT 229    Chemistries   Recent Labs Lab 11/25/16 0953 11/26/16 0402  11/29/16 0443  NA 138 134*  < > 140  K 3.2* 2.9*  < > 3.9  CL 103 98*  < > 104  CO2 32 29  < > 31  GLUCOSE 127* 88  < > 109*  BUN 22* 11  < > 26*  CREATININE 0.84 0.64  < > 0.63  CALCIUM 8.5* 8.6*  < > 8.6*  MG  --  1.6*  --   --   AST 16  --   --   --   ALT 10*  --   --   --   ALKPHOS 72  --   --   --  BILITOT 0.6  --   --   --   < > = values in this interval not displayed.  Microbiology Results   Recent Results (from the past 240 hour(s))  MRSA PCR Screening     Status: None   Collection Time: 11/25/16 10:00 PM  Result Value Ref Range Status   MRSA by PCR NEGATIVE NEGATIVE Final    Comment:        The GeneXpert MRSA Assay (FDA approved for NASAL specimens only), is one component of a comprehensive MRSA colonization surveillance program. It is not intended to diagnose MRSA infection nor to guide or monitor treatment for MRSA infections.     RADIOLOGY:  No results found.   Management plans discussed with the patient, family and they are in agreement.  CODE STATUS:     Code Status Orders        Start     Ordered   11/25/16 1937  Do not attempt resuscitation (DNR)  Continuous    Question Answer Comment  In the event of cardiac or respiratory ARREST Do not call a "code blue"   In the event of cardiac or respiratory ARREST Do not perform Intubation, CPR, defibrillation or ACLS   In the event of cardiac or respiratory ARREST Use medication by any route, position, wound care, and  other measures to relive pain and suffering. May use oxygen, suction and manual treatment of airway obstruction as needed for comfort.      11/25/16 1937    Code Status History    Date Active Date Inactive Code Status Order ID Comments User Context   11/25/2016  5:13 PM 11/25/2016  7:37 PM DNR [id number]  Dustin Flock, MD ED   07/05/2016  9:48 PM 07/06/2016  7:41 PM DNR [id number]  Dustin Flock, MD Inpatient   08/03/2013  3:40 PM 08/07/2013  7:05 PM Full Code [id number]  Costin Karlyne Greenspan, MD ED    Advance Directive Documentation     Most Recent Value  Type of Advance Directive  Out of facility DNR (pink MOST or yellow form)  Pre-existing out of facility DNR order (yellow form or pink MOST form)  Physician notified to receive inpatient order  "MOST" Form in Place?  -      TOTAL TIME TAKING CARE OF THIS PATIENT: 40 minutes.    Bradshaw Minihan M.D on 11/30/2016 at 7:37 AM  Between 7am to 6pm - Pager - [phone] After 6pm go to [email or web address] - password [email or web address] [place] [facility]  Office  [phone]  CC: Primary care physician; No PCP Per Patient

## 2016-11-30 NOTE — Care Management Important Message (Signed)
Important Message  Patient Details  Name: [name] MRN: [id number] Date of Birth: [birth year]   Medicare Important Message Given:  Yes    Shelbie Ammons, RN 11/30/2016, 11:14 AM

## 2016-11-30 NOTE — Progress Notes (Signed)
Patient is back to baseline.  D/cing to SNF - [facility] [address].  Called report to [place] at [phone].  Patient has been lethargic.  Responds to voice sometimes and pain. She is unintelligible verbally  Pt takes her meds crushed in ice cream.  She's been non-combative with [place].  Family, particularly, daughter Lattie Haw is very involved with her.  She will also be followed [facility].  Except for hematoma and large bruise on her R forehead and R hip - skin is ok.  Foam dressing on sacrum for prophalaxsis and heels have been floated. Explained that pt is a total care and a feeder.  She's on a dysphagia I diet.  EMS has been called for transport.

## 2016-12-02 ENCOUNTER — Encounter: Payer: Self-pay | Admitting: Internal Medicine

## 2016-12-02 ENCOUNTER — Non-Acute Institutional Stay (SKILLED_NURSING_FACILITY): Payer: Medicare Other | Admitting: Internal Medicine

## 2016-12-02 DIAGNOSIS — G309 Alzheimer's disease, unspecified: Secondary | ICD-10-CM

## 2016-12-02 DIAGNOSIS — F329 Major depressive disorder, single episode, unspecified: Secondary | ICD-10-CM | POA: Diagnosis not present

## 2016-12-02 DIAGNOSIS — R531 Weakness: Secondary | ICD-10-CM | POA: Diagnosis not present

## 2016-12-02 DIAGNOSIS — I951 Orthostatic hypotension: Secondary | ICD-10-CM

## 2016-12-02 DIAGNOSIS — E43 Unspecified severe protein-calorie malnutrition: Secondary | ICD-10-CM | POA: Diagnosis not present

## 2016-12-02 DIAGNOSIS — K5900 Constipation, unspecified: Secondary | ICD-10-CM

## 2016-12-02 DIAGNOSIS — D638 Anemia in other chronic diseases classified elsewhere: Secondary | ICD-10-CM | POA: Diagnosis not present

## 2016-12-02 DIAGNOSIS — R1310 Dysphagia, unspecified: Secondary | ICD-10-CM | POA: Diagnosis not present

## 2016-12-02 DIAGNOSIS — F0281 Dementia in other diseases classified elsewhere with behavioral disturbance: Secondary | ICD-10-CM

## 2016-12-02 DIAGNOSIS — G308 Other Alzheimer's disease: Secondary | ICD-10-CM | POA: Diagnosis not present

## 2016-12-02 NOTE — Progress Notes (Signed)
LOCATION: [name]  PCP: No PCP Per Patient   Code Status: DNR  Goals of care: Advanced Directive information Advanced Directives 11/25/2016  Does Patient Have a Medical Advance Directive? Yes  Type of Advance Directive Out of facility DNR (pink MOST or yellow form)  Does patient want to make changes to medical advance directive? No - Patient declined  Copy of [place] in Chart? -  Pre-existing out of facility DNR order (yellow form or pink MOST form) -       Extended Emergency Contact Information Primary Emergency Contact: Rhyne,[name] Address: [place], [place] Phone: [phone] Work Phone: [phone] Mobile Phone: [phone] Relation: Daughter Secondary Emergency Contact: Janae Bridg[place] Phone: [phone] Mobile Phone: [phone] Relation: Niece   Allergies  Allergen Reactions  . Aricept [Donepezil Hcl]     Increased hallucinations   . Codeine   . Erythromycin     REACTION: GI upset  . Penicillins Other (See Comments)    Has patient had a PCN reaction causing immediate rash, facial/tongue/throat swelling, SOB or lightheadedness with hypotension: UNK  Has patient had a PCN reaction causing severe rash involving mucus membranes or skin necrosis: UNK Has patient had a PCN reaction that required hospitalization: UNK Has patient had a PCN reaction occurring within the last 10 years: UNK If all of the above answers are "NO", then may proceed with Cephalosporin use. ** ALLERGY LISTED ON MAR, UNKNOWN REACTION**  . Sulfonamide Derivatives     REACTION: GI upset    Chief Complaint  Patient presents with  . New Admit To SNF    New Admission Visit      HPI:  Patient is a 83 y.o. female seen today for short term rehabilitation post hospital admission from 11/25/16-11/30/16 post fall with hypotension and large contusion to right forhead. CT head was  negative for acute bleed and intracranial abnormalities.C-spine fracture was ruled out. Infectious workup was negative. Her fall was thought to be from deconditioning from her advanced dementia. She was having loose stool and her miralax was discontinued following which this improved. She has PMH of alzheimer's dementia, colon cancer s/p colectomy, orthostasis, thyroid disease, anxiety among others. She is seen in her room today. She is pleasantly confused and does not participate in HPI and ROS.   Review of Systems: unable to obtain from patient. Per nursing,  Constitutional: Negative for fever.  Respiratory: Negative for cough, shortness of breath Gastrointestinal: Negative for vomiting, abdominal pain, Musculoskeletal: Negative for fall in the facility.  Skin: Negative for itching, rash.     Past Medical History:  Diagnosis Date  . Alzheimer's dementia   . Anxiety    H/o anxiety, would use as needed diazepam with relief  . Colon cancer [facility])    h/o colon CA 2010- colectomy 2010  (Dr. Lamonte Sakai with oncology)  . Falls   . Orthostasis    h/o orthostasis, didn't tolerate midodrine, prev eval by Dr. Marlou Porch  . Orthostatic hypotension   . PONV (postoperative nausea and vomiting)   . Thyroid disease    H/o thyroid nodule with benign bx 2010  . UTI (urinary tract infection)    Past Surgical History:  Procedure Laterality Date  . ABDOMINAL HYSTERECTOMY    . PARTIAL COLECTOMY  2010   for colon CA  . US ECHOCARDIOGRAPHY  [id number]   normal   Social  History:   reports that she has never smoked. She has never used smokeless tobacco. She reports that she does not drink alcohol or use drugs.  No family history on file.  Medications: Allergies as of 12/02/2016      Reactions   Aricept [donepezil Hcl]    Increased hallucinations    Codeine    Erythromycin    REACTION: GI upset   Penicillins Other (See Comments)   Has patient had a PCN reaction causing immediate rash, facial/tongue/throat  swelling, SOB or lightheadedness with hypotension: UNK  Has patient had a PCN reaction causing severe rash involving mucus membranes or skin necrosis: UNK Has patient had a PCN reaction that required hospitalization: UNK Has patient had a PCN reaction occurring within the last 10 years: UNK If all of the above answers are "NO", then may proceed with Cephalosporin use. ** ALLERGY LISTED ON MAR, UNKNOWN REACTION**   Sulfonamide Derivatives    REACTION: GI upset      Medication List       Accurate as of 12/02/16 11:20 AM. Always use your most recent med list.          busPIRone 10 MG tablet Commonly known as:  BUSPAR Take 10 mg by mouth 2 (two) times daily.   fludrocortisone 0.1 MG tablet Commonly known as:  FLORINEF Take 0.1 mg by mouth 2 (two) times daily.   LORazepam 0.5 MG tablet Commonly known as:  ATIVAN Take 1 tablet (0.5 mg total) by mouth 3 (three) times daily.   LORazepam 0.5 MG tablet Commonly known as:  ATIVAN Take 0.5 tablets (0.25 mg total) by mouth every 6 (six) hours as needed for anxiety.   Melatonin 3 MG Tabs Take 3 mg by mouth at bedtime.   multivitamin with minerals Tabs tablet Take 1 tablet by mouth daily.   polyethylene glycol packet Commonly known as:  MIRALAX / GLYCOLAX Take 17 g by mouth daily as needed for mild constipation.   QUEtiapine 100 MG tablet Commonly known as:  SEROQUEL Take 100 mg by mouth 2 (two) times daily.   venlafaxine XR 150 MG 24 hr capsule Commonly known as:  EFFEXOR-XR Take 150 mg by mouth daily with breakfast.       Immunizations: Immunization History  Administered Date(s) Administered  . PPD Test 12/01/2016     Physical Exam: Vitals:   12/02/16 1112  BP: 113/65  Pulse: 72  Resp: 18  Temp: 97.7 F (36.5 C)  TempSrc: Oral  SpO2: 96%  Weight: 135 lb 8 oz (61.5 kg)  Height: 5\' 5"  (1.651 m)   Body mass index is 22.55 kg/m.  General- elderly female, frail and thin built, in no acute distress Head-  normocephalic, large knot on right forehead with hematoma and bruise around Nose- no nasal discharge Throat- moist mucus membrane  Eyes- PERRLA, EOMI, no pallor, no icterus, no discharge, normal conjunctiva, normal sclera Neck- no cervical lymphadenopathy Cardiovascular- normal s1,s2, no murmur Respiratory- bilateral clear to auscultation, no wheeze, no rhonchi, no crackles, no use of accessory muscles Abdomen- bowel sounds present, soft, non tender, no guarding or rigidity Musculoskeletal- able to move all 4 extremities,generalized weakness Neurological- alert and oriented to self only, pleasantly confused Skin- warm and dry    Labs reviewed: Basic Metabolic Panel:  Recent Labs  11/26/16 0402 11/27/16 0421 11/29/16 0443  NA 134* 140 140  K 2.9* 4.4 3.9  CL 98* 104 104  CO2 29 31 31   GLUCOSE 88 93 109*  BUN  11 20 26*  CREATININE 0.64 0.94 0.63  CALCIUM 8.6* 8.9 8.6*  MG 1.6*  --   --    Liver Function Tests:  Recent Labs  07/05/16 1616 11/25/16 0953  AST 18 16  ALT 10* 10*  ALKPHOS 77 72  BILITOT 0.5 0.6  PROT 6.8 5.9*  ALBUMIN 3.5 3.0*   No results for input(s): LIPASE, AMYLASE in the last 8760 hours. No results for input(s): AMMONIA in the last 8760 hours. CBC:  Recent Labs  07/05/16 1616  07/07/16 1733 11/25/16 0953 11/26/16 0402  WBC 8.8  < > 7.5 6.5 9.2  NEUTROABS 6.7*  --  4.4  --   --   HGB 10.8*  < > 10.6* 10.6* 11.8*  HCT 33.4*  < > 32.4* 32.1* 35.2  MCV 87.3  < > 88.4 83.7 84.0  PLT 190  < > 185 203 229  < > = values in this interval not displayed. Cardiac Enzymes:  Recent Labs  07/05/16 1616 11/25/16 0953  TROPONINI <0.03 <0.03   BNP: Invalid input(s): POCBNP CBG: No results for input(s): GLUCAP in the last 8760 hours.  Radiological Exams: Dg Chest 2 View  Result Date: 11/25/2016 CLINICAL DATA:  Recent fall EXAM: CHEST  2 VIEW COMPARISON:  03/08/2016 FINDINGS: Cardiac shadow is mildly enlarged but stable. Tortuosity of the  thoracic aorta is again seen. The lungs are well aerated bilaterally with minimal bibasilar atelectasis. No focal confluent infiltrate or effusion is seen. No acute bony abnormality is noted. IMPRESSION: Mild bibasilar atelectasis. Electronically Signed   By: Inez Catalina M.D.   On: 11/25/2016 10:24   Ct Head Wo Contrast  Result Date: 11/25/2016 CLINICAL DATA:  83 year old female with large hematoma on the right side of the forehead. Presumed trauma from fall. EXAM: CT HEAD WITHOUT CONTRAST CT CERVICAL SPINE WITHOUT CONTRAST TECHNIQUE: Multidetector CT imaging of the head and cervical spine was performed following the standard protocol without intravenous contrast. Multiplanar CT image reconstructions of the cervical spine were also generated. COMPARISON:  Head and cervical spine CT 07/05/2016. FINDINGS: CT HEAD FINDINGS Brain: Mild cerebral atrophy. Patchy and confluent areas of decreased attenuation are noted throughout the deep and periventricular white matter of the cerebral hemispheres bilaterally, compatible with chronic microvascular ischemic disease. No evidence of acute infarction, hemorrhage, hydrocephalus, extra-axial collection or mass lesion/mass effect. Vascular: No hyperdense vessel or unexpected calcification. Skull: Normal. Negative for fracture or focal lesion. Sinuses/Orbits: No acute finding. Mild multifocal mucosal thickening in the right ethmoid sinuses. Other: Large amount high attenuation soft tissue swelling in the right frontal scalp, compatible with the reported scalp hematoma. CT CERVICAL SPINE FINDINGS Alignment: Normal. Skull base and vertebrae: No acute fracture. No primary bone lesion or focal pathologic process. Soft tissues and spinal canal: No prevertebral fluid or swelling. No visible canal hematoma. Disc levels: Mild multilevel degenerative disc disease, most pronounced at C5-C6 and C6-C7. Mild multilevel facet arthropathy. Upper chest: Multiple large heterogeneous appearing  nodules within the thyroid gland, largest of which is in the left lobe of the gland measuring up to 3.8 x 2.4 cm. Other: None. IMPRESSION: 1. No evidence of significant acute traumatic injury to the skull, brain or cervical spine. 2. Mild cerebral atrophy with extensive chronic microvascular ischemic changes in the cerebral white matter, as above. 3. Mild multilevel degenerative disc disease and cervical spondylosis. 4. Multiple large nodules in the thyroid gland, similar to prior studies, presumably a multinodular goiter. These have been stable for several years. Electronically  Signed   By: Vinnie Langton M.D.   On: 11/25/2016 10:33   Ct Cervical Spine Wo Contrast  Result Date: 11/25/2016 CLINICAL DATA:  83 year old female with large hematoma on the right side of the forehead. Presumed trauma from fall. EXAM: CT HEAD WITHOUT CONTRAST CT CERVICAL SPINE WITHOUT CONTRAST TECHNIQUE: Multidetector CT imaging of the head and cervical spine was performed following the standard protocol without intravenous contrast. Multiplanar CT image reconstructions of the cervical spine were also generated. COMPARISON:  Head and cervical spine CT 07/05/2016. FINDINGS: CT HEAD FINDINGS Brain: Mild cerebral atrophy. Patchy and confluent areas of decreased attenuation are noted throughout the deep and periventricular white matter of the cerebral hemispheres bilaterally, compatible with chronic microvascular ischemic disease. No evidence of acute infarction, hemorrhage, hydrocephalus, extra-axial collection or mass lesion/mass effect. Vascular: No hyperdense vessel or unexpected calcification. Skull: Normal. Negative for fracture or focal lesion. Sinuses/Orbits: No acute finding. Mild multifocal mucosal thickening in the right ethmoid sinuses. Other: Large amount high attenuation soft tissue swelling in the right frontal scalp, compatible with the reported scalp hematoma. CT CERVICAL SPINE FINDINGS Alignment: Normal. Skull base and  vertebrae: No acute fracture. No primary bone lesion or focal pathologic process. Soft tissues and spinal canal: No prevertebral fluid or swelling. No visible canal hematoma. Disc levels: Mild multilevel degenerative disc disease, most pronounced at C5-C6 and C6-C7. Mild multilevel facet arthropathy. Upper chest: Multiple large heterogeneous appearing nodules within the thyroid gland, largest of which is in the left lobe of the gland measuring up to 3.8 x 2.4 cm. Other: None. IMPRESSION: 1. No evidence of significant acute traumatic injury to the skull, brain or cervical spine. 2. Mild cerebral atrophy with extensive chronic microvascular ischemic changes in the cerebral white matter, as above. 3. Mild multilevel degenerative disc disease and cervical spondylosis. 4. Multiple large nodules in the thyroid gland, similar to prior studies, presumably a multinodular goiter. These have been stable for several years. Electronically Signed   By: Vinnie Langton M.D.   On: 11/25/2016 10:33    Assessment/Plan  Generalized weakness With deconditioning. Will have patient work with PT/OT as tolerated to regain strength and restore function.  Fall precautions are in place. Patient was followed by hospice services at home, get palliative care consult here.  Chronic hypotension Monitor her vital signs, continue florinef 0.1 mg bid  Severe protein calorie malnutrition Get RD to evaluate. Decline anticipated with her advanced dementia. Monitor weight. Provide assistance with feeding. Continue mvi supplement.   Alzheimer's dementia with behavior disturbance Advanced with poor prognosis. Provide supportive care. Continue seroquel 100 mg bid. Fall precautions and pressure ulcer prophylaxis. Palliative care consult.  Anemia of chronic disease Monitor cbc  Constipation Monitor bowel movement. Provide miralax daily on need basis  Chronic depression Monitor mood, continue buspirone 10 mg bid with venlafaxine 150 mg  daily. Continue lorazepam 0.5 mg tid with prn in between. pysch consult.   Dysphagia Aspiration precaution, puree diet and assistance with feeding   Goals of care: short term rehabilitation, possible long term care   Labs/tests ordered: cbc, bmp 12/06/16   Family/ staff Communication: reviewed care plan with patient's nursing supervisor    Blanchie Serve, MD Internal Medicine [place], [place] [address] Cell Phone (Monday-Friday 8 am - 5 pm): [phone] On Call: [phone] and follow prompts after 5 pm and on weekends Office Phone: [phone] Office Fax: [phone]

## 2017-05-26 IMAGING — CT CT CERVICAL SPINE W/O CM
3 of 12 series · 5 of 33 positions shown, 6 images · non-contrast
Comparison: 03/08/2016

CLINICAL DATA: Recent fall at [HOSPITAL], obvious facial trauma
and bruising.

EXAM:
CT HEAD WITHOUT CONTRAST
CT MAXILLOFACIAL WITHOUT CONTRAST
CT CERVICAL SPINE WITHOUT CONTRAST
TECHNIQUE: Multidetector CT imaging of the head, cervical spine, and
maxillofacial structures were performed using the standard protocol
without intravenous contrast. Multiplanar CT image reconstructions
of the cervical spine and maxillofacial structures were also
generated.

[Series 10: coronal bone · coronal · 0.35mm/px · 1 of 81 slices shown]
[im 79/81  bone]
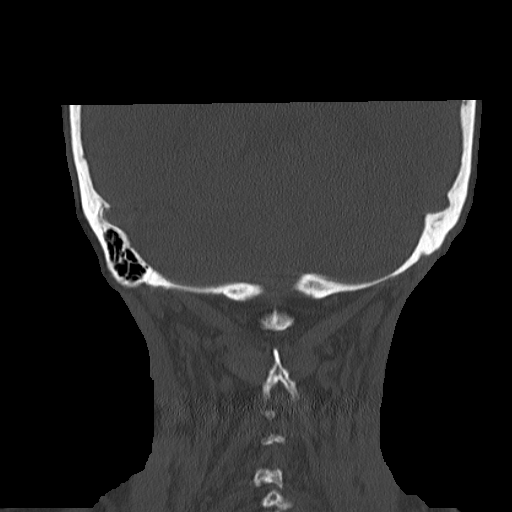

[Series 14: sagittal bone · sagittal · 0.25mm/px · 1 of 43 slices shown]
[im 22/43  bone]
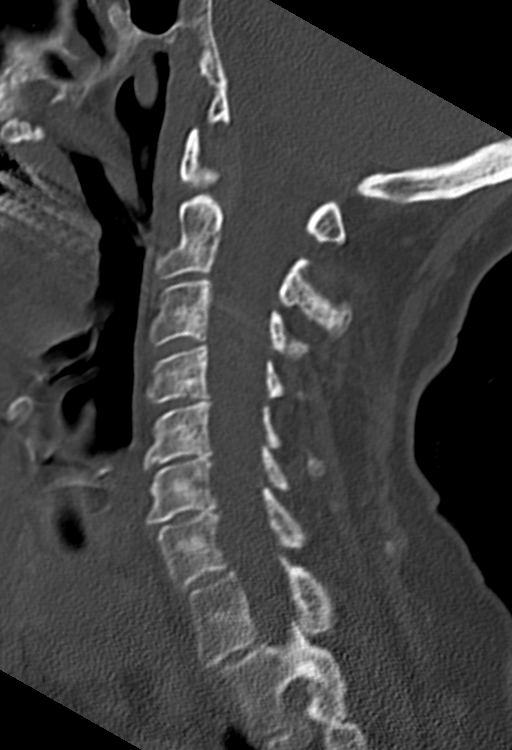

[Series 16: axial · axial · 0.23mm/px · z∈[-777,-621]mm · 3 of 89 slices shown, 4 images]
[im 1/89  soft-tissue]
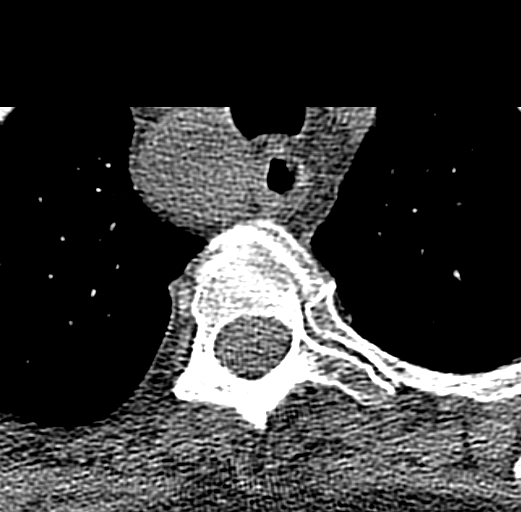
[im 1/89  bone]
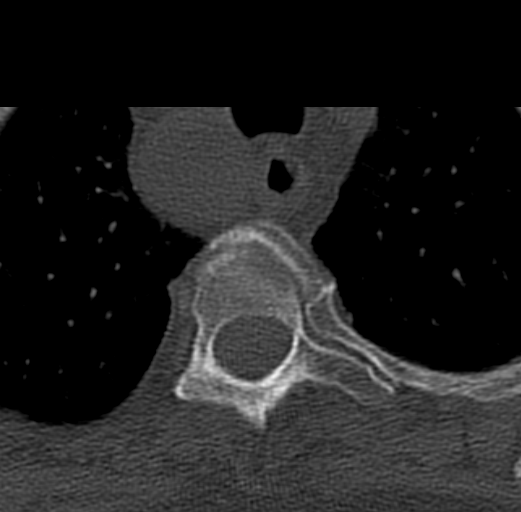
[im 45/89  bone]
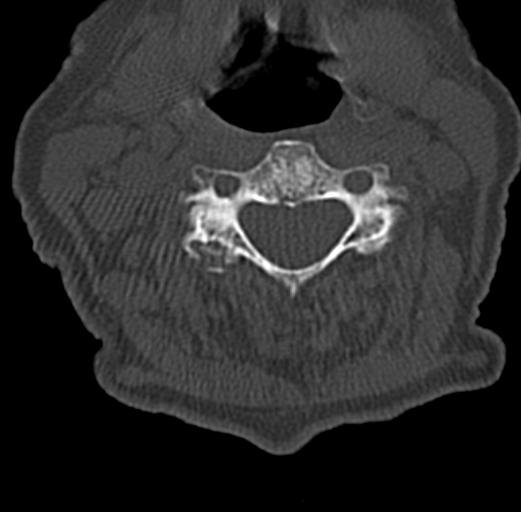
[im 89/89  bone]
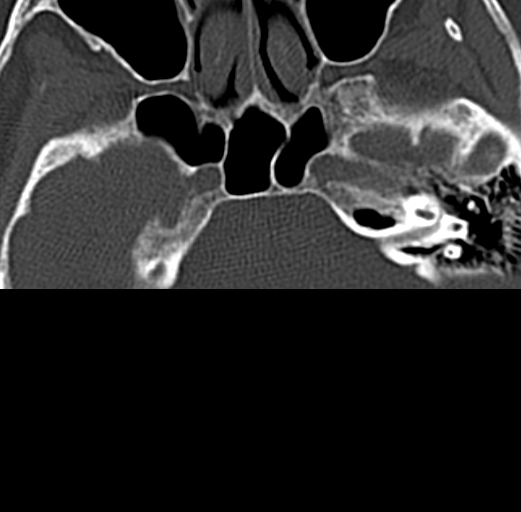

[5 of 33 positions shown; findings below may reference images not displayed]

FINDINGS: CT HEAD FINDINGS

Limited with motion artifact. Stable brain atrophy and chronic white
matter microvascular ischemic changes throughout the cerebral
hemispheres. Remote left basal ganglia lacunar-type infarcts. No
acute intracranial hemorrhage, mass lesion, infarction, midline
shift, herniation, hydrocephalus, or extra-axial fluid collection.
Ventricles are symmetric. Cisterns are patent. Cerebellar atrophy as
well. Atherosclerosis of the intracranial vessels of the skullbase.
Orbits are symmetric. Skull appears intact. Mastoids clear. Minor
ethmoid mucosal thickening. Sinuses remain clear otherwise.

CT MAXILLOFACIAL FINDINGS

Also limited with motion artifact. This severely limits evaluation
of the mandible. Maxilla, pterygoid plates, nasal septum, nasal
bones, zygomas, skullbase, and orbits appear intact. Focal right
anterior maxillary and nasal soft tissue swelling/ bruising noted
from the fall. No orbital blowout fracture. Mastoids and sinuses
remain clear. No sinus hemorrhage or hematoma. Orbits appear
symmetric.

CT CERVICAL SPINE FINDINGS

Normal cervical spine alignment. Bones are osteopenic. Degenerative
spondylosis at all levels of the cervical spine, most pronounced and
C4-5, C5-6, and C6-7. These 3 levels demonstrate disc space
narrowing, sclerosis and osteophytes. Multilevel facet arthropathy
noted posteriorly. No subluxation or dislocation. No acute fracture,
compression deformity, or focal kyphosis. Normal prevertebral soft
tissues. Intact odontoid.

Clear lung apices. Metallic streak artifact from the dental
hardware. No soft tissue asymmetry in the neck. Carotid
atherosclerosis noted. Heterogeneous thyroid masses again noted
bilaterally extending substernal. No significant interval change in
the thyroid abnormalities.
IMPRESSION: No acute intracranial process. Stable atrophy and chronic white
matter microvascular ischemic changes. No acute intracranial
hemorrhage.

No definite acute facial bony trauma or fracture. Limited assessment
of the mandible because of motion artifact to exclude a mandible
fracture. No other facial bony trauma or fracture. Right maxillary
and nasal soft tissue swelling/ bruising noted.

Stable cervical degenerative spondylosis without malalignment or
acute cervical spine fracture.

Stable hypodense masses in the thyroid.

## 2018-03-30 DEATH — deceased
